# Patient Record
Sex: Female | Born: 1965 | Race: Black or African American | Hispanic: No | Marital: Married | Smoking: Never smoker
Health system: Southern US, Community
[De-identification: ages and names within clinical notes are randomized; demographics above are authoritative.]

## PROBLEM LIST (undated history)

## (undated) DIAGNOSIS — E119 Type 2 diabetes mellitus without complications: Secondary | ICD-10-CM

## (undated) DIAGNOSIS — Z9289 Personal history of other medical treatment: Secondary | ICD-10-CM

## (undated) DIAGNOSIS — I10 Essential (primary) hypertension: Secondary | ICD-10-CM

## (undated) DIAGNOSIS — D649 Anemia, unspecified: Secondary | ICD-10-CM

## (undated) DIAGNOSIS — IMO0001 Reserved for inherently not codable concepts without codable children: Secondary | ICD-10-CM

## (undated) HISTORY — PX: OOPHORECTOMY: SHX86

---

## 2001-06-11 HISTORY — PX: MYOMECTOMY: SHX85

## 2001-07-10 ENCOUNTER — Inpatient Hospital Stay (HOSPITAL_COMMUNITY): Admission: RE | Admit: 2001-07-10 | Discharge: 2001-07-11 | Payer: Self-pay | Admitting: Gynecology

## 2001-07-10 ENCOUNTER — Encounter (INDEPENDENT_AMBULATORY_CARE_PROVIDER_SITE_OTHER): Payer: Self-pay | Admitting: Specialist

## 2004-02-18 ENCOUNTER — Ambulatory Visit (HOSPITAL_COMMUNITY): Admission: RE | Admit: 2004-02-18 | Discharge: 2004-02-18 | Payer: Self-pay | Admitting: Gynecology

## 2004-03-13 HISTORY — PX: REDUCTION MAMMAPLASTY: SUR839

## 2004-04-06 ENCOUNTER — Other Ambulatory Visit: Admission: RE | Admit: 2004-04-06 | Discharge: 2004-04-06 | Payer: Self-pay | Admitting: Obstetrics and Gynecology

## 2004-07-18 ENCOUNTER — Ambulatory Visit (HOSPITAL_BASED_OUTPATIENT_CLINIC_OR_DEPARTMENT_OTHER): Admission: RE | Admit: 2004-07-18 | Discharge: 2004-07-18 | Payer: Self-pay | Admitting: Specialist

## 2004-07-18 ENCOUNTER — Encounter (INDEPENDENT_AMBULATORY_CARE_PROVIDER_SITE_OTHER): Payer: Self-pay | Admitting: *Deleted

## 2004-07-18 ENCOUNTER — Ambulatory Visit (HOSPITAL_COMMUNITY): Admission: RE | Admit: 2004-07-18 | Discharge: 2004-07-18 | Payer: Self-pay | Admitting: Specialist

## 2005-05-25 ENCOUNTER — Other Ambulatory Visit: Admission: RE | Admit: 2005-05-25 | Discharge: 2005-05-25 | Payer: Self-pay | Admitting: Gynecology

## 2005-05-26 ENCOUNTER — Ambulatory Visit (HOSPITAL_COMMUNITY): Admission: RE | Admit: 2005-05-26 | Discharge: 2005-05-26 | Payer: Self-pay | Admitting: Gynecology

## 2005-06-09 ENCOUNTER — Encounter: Admission: RE | Admit: 2005-06-09 | Discharge: 2005-06-09 | Payer: Self-pay | Admitting: Gynecology

## 2005-09-26 ENCOUNTER — Ambulatory Visit (HOSPITAL_COMMUNITY): Admission: RE | Admit: 2005-09-26 | Discharge: 2005-09-26 | Payer: Self-pay | Admitting: Obstetrics and Gynecology

## 2005-09-26 ENCOUNTER — Encounter (INDEPENDENT_AMBULATORY_CARE_PROVIDER_SITE_OTHER): Payer: Self-pay | Admitting: *Deleted

## 2006-03-23 ENCOUNTER — Ambulatory Visit (HOSPITAL_COMMUNITY): Admission: RE | Admit: 2006-03-23 | Discharge: 2006-03-23 | Payer: Self-pay | Admitting: Gynecology

## 2006-05-01 ENCOUNTER — Ambulatory Visit (HOSPITAL_COMMUNITY): Admission: RE | Admit: 2006-05-01 | Discharge: 2006-05-01 | Payer: Self-pay | Admitting: Gynecology

## 2006-09-13 ENCOUNTER — Other Ambulatory Visit: Admission: RE | Admit: 2006-09-13 | Discharge: 2006-09-13 | Payer: Self-pay | Admitting: Gynecology

## 2010-02-23 ENCOUNTER — Other Ambulatory Visit
Admission: RE | Admit: 2010-02-23 | Discharge: 2010-02-23 | Payer: Self-pay | Source: Home / Self Care | Admitting: Internal Medicine

## 2010-04-12 ENCOUNTER — Other Ambulatory Visit: Payer: Self-pay | Admitting: Internal Medicine

## 2010-04-12 DIAGNOSIS — Z1231 Encounter for screening mammogram for malignant neoplasm of breast: Secondary | ICD-10-CM

## 2010-04-12 DIAGNOSIS — Z1239 Encounter for other screening for malignant neoplasm of breast: Secondary | ICD-10-CM

## 2010-04-21 ENCOUNTER — Ambulatory Visit
Admission: RE | Admit: 2010-04-21 | Discharge: 2010-04-21 | Disposition: A | Discharge status: Home / Self Care | Payer: BC Managed Care – PPO | Source: Ambulatory Visit | Attending: Internal Medicine | Admitting: Internal Medicine

## 2010-04-21 DIAGNOSIS — Z1231 Encounter for screening mammogram for malignant neoplasm of breast: Secondary | ICD-10-CM

## 2010-07-29 NOTE — Op Note (Signed)
[facility]  Patient:    [name], [name] Visit Number: [id number] MRN: [id number]          Service Type: GYN Location: 4W 0449 01 Attending Physician:  Teodora Medici Cabitt Dictated by:   Leatha Gilding. Mezer, M.D. Proc. Date: 07/10/01 Admit Date:  07/10/2001   CC:         Harl Bowie, M.D.   Operative Report  PREOPERATIVE DIAGNOSIS:  Fibroid uterus.  POSTOPERATIVE DIAGNOSES: 1. Fibroid uterus. 2. Adhesions.  OPERATION PERFORMED:  Myomectomy.  SURGEON:  Leatha Gilding. Mezer, M.D.  ASSISTANT:  Harl Bowie, M.D.  ANESTHESIA:  General endotracheal.  PREPARATION:  Betadine.  DESCRIPTION OF PROCEDURE:  With the patient in the supine position, she was prepped and draped in the routine fashion.  A Pfannenstiel incision was made through the skin and subcutaneous tissue.  The fascia and peritoneum were then without difficulty.  A brief exploration of her upper abdomen was benign. Exploration of the pelvis revealed the uterus to be approximately 14 weeks in size, reasonably symmetrical, and rotated slightly with the left adnexa anterior.  There were dense adhesions of bowel to the entire posterior wall of the uterus, beginning just below the fundus.  The ovaries were adherent in these adhesions.  The fallopian tubes were remarkably normal given the degree of scarring.  There were no easy areas to either take the adhesions down or to gain access to the posterior aspect of the uterus.  As the patient was not having significant pain and the fallopian tubes were remarkably spared, the decision was made not to take these adhesions down.  Palpation of the uterus revealed a fibroid in the center part of the uterus in the AP direction to the left of the midline.  The remainder of the uterus was quite soft, and defining exactly where the fibroid was was not possible.  Given the significant potential for a hysterectomy and degree of scarring that had  been unanticipated, a consultation with the patients husband was held.  He felt that her wishes would be best served by proceeding with the myomectomy.  The surgeon returned to the operating room, re-scrubbed, and gowned.  A solution of dilute Pitressin was injected in the anterior uterine wall and cautery used to open the myometrium.  A fibroid was identified and with very slow and careful dissection, was extracted from the myometrium.  This fibroid measured 9 cm x 6 cm x 3 cm and was very multilobed.  Great care was taken not to enter the endometrial cavity.  Indigo carmine was injected transcervically and, indeed, the integrity of the cavity was verified.  The uterine incision was then closed in layers using running and interrupted 0 chromic suture, and the serosa was approximated with a running baseball stitch of 3-0 PDS.  Hemostasis was excellent.  At the completion of the procedure, the pelvis was irrigated with copious amounts of warm Lactated Ringers solution and aspirated.  With hemostasis intact, the abdomen was then closed in layers using a running 2-0 Vicryl on the peritoneum, running 0 Vicryl to the midline bilaterally on the fascia.  Hemostasis was assured in the subcutaneous tissue, and the skin was closed with staples.  The estimated blood loss was approximately 100 cc.  The sponge, needle, and instrument counts were correct x 2.  The patient tolerated the procedure well and was taken to the recovery room in satisfactory condition. Dictated by:   Leatha Gilding. Mezer, M.D. Attending Physician:  Teodora Medici  Cabitt DD:  07/10/01 TD:  07/11/01 Job: [id number]

## 2010-07-29 NOTE — Op Note (Signed)
NAMECORSICA, FRANSON            ACCOUNT NO.:  [id number]   MEDICAL RECORD NO.:  [id number]          PATIENT TYPE:  AMB   LOCATION:  DSC                          FACILITY:  [facility]   PHYSICIAN:  Gerald L. Truesdale, M.D.DATE OF BIRTH:  [birth year]   DATE OF PROCEDURE:  07/18/2004  DATE OF DISCHARGE:                                 OPERATIVE REPORT   HISTORY OF PRESENT ILLNESS:  This is a 45 year old lady with darkened  pigmented lesion involving her left chest area measuring approximately 1 x  1.2 cm, darker, and has gotten larger over the last few months.   PROCEDURES DONE:  Excision and plastic reconstruction.   ANESTHESIA:  General in preparation for a breast reduction.   Prep was done with Betadine soap and solution, walled off with sterile  towels, and draped so as to make a sterile field. After the patient was  intubated, wide excision was made of the mass. Subcutaneous closure with  done with superior flaps and inferior flap freeing up and the wounds were  brought back together in layers with 3-0 Monocryl x2 layers, a subdermal  suture of 5-0 Monocryl, and then an area in the subcuticular stitch of 5-0  Monocryl. With adequate margins the specimen was sent to pathology for  examination. Steri-Strips and soft dressings were applied. She withstood the  procedures very well and was taken to recovery in excellent condition.      GLT/MEDQ  D:  07/18/2004  T:  07/18/2004  Job:  [id number]

## 2010-07-29 NOTE — H&P (Signed)
[facility]  Patient:    [name], [name] Visit Number: [id number] MRN: [id number]          Service Type: GYN Location: 4W 0449 01 Attending Physician:  Teodora Medici Cabitt Dictated by:   Leatha Gilding. Mezer, M.D. Admit Date:  07/10/2001                           History and Physical  ADMISSION DIAGNOSIS:  Fibroid uterus.  HISTORY OF PRESENT ILLNESS:  The patient is a 45 year old, gravida 1, SAB 1, female admitted with fibroid uterus for myomectomy.  The patient has been without contraception for the last three years and has regular menses occurring every 28-32 days.  Her last menstrual period was on June 30, 2001, and was reported to be normal.  On hysterosalpingogram, there was marked distortion of the uterine cavity with presumably a large fibroid on the left side of the cavity.  There was left proximal fill with right fill and spill. A myomectomy has been discussed at length with the patient and her husband and they have consented to proceed with myomectomy.  The potential risks of the surgery, including, but not limited to anesthesia, injury to the bowel, bladder, and ureters, possible blood loss with transfusion and its sequelae, and possible infection, have been discussed.  The potential for postoperative adhesions and decreased fertility has also been reviewed.  The status of the left fallopian tube will need to be determined at the time of surgery and it may suffer damage as a result of this surgery.  The patient and her husband understand that there is no guarantee to become pregnant or not to miscarry after the surgery.  The potential for a hysterectomy given the size and location of the fibroid has also been discussed at length.  The patient understands that if she undergoes hysterectomy that this will result in permanent sterilization.  Postoperative restrictions and expectations have been discussed.  The patient understands that given the  signs and location of the fibroid it is imperative that she not conceive for at least six months postoperatively because of the potential for rupture of the uterus that could be catastrophic for both the fetus and the mother.  The patient and her husband appear to understand the surgery and its limitations.  PAST SURGICAL HISTORY: 1. Thumb. 2. Hysteroscopy.  PAST MEDICAL HISTORY:  Noncontributory.  MEDICATIONS:  Iron.  ALLERGIES:  None known.  SOCIAL HISTORY:  ETOH:  Occasional.  The patient is married to a nephrologist in [place], [place], and is employed as an Airline pilot.  FAMILY HISTORY:  Positive for diabetes and fibroids.  PHYSICAL EXAMINATION:  HEENT negative.  LUNGS:  Clear.  HEART:  Without murmurs.  BREASTS:  Without masses or discharge.  ABDOMEN:  Soft and nontender.  PELVIC:  BUS, vagina, and cervix normal.  The uterus is approximately 14-16 weeks size and irregular.  The adnexa are without palpable masses.  EXTREMITIES:  Negative.  IMPRESSION:  Fibroid uterus.  PLAN:  Myomectomy. Dictated by:   Leatha Gilding. Mezer, M.D. Attending Physician:  Rolinda Roan DD:  07/10/01 TD:  07/10/01 Job: [id number]

## 2010-07-29 NOTE — H&P (Signed)
NAME:  [name], [name] NO.:  [id number]   MEDICAL RECORD NO.:  [id number]          PATIENT TYPE:  AMB   LOCATION:  SDC                           FACILITY:  [facility]   PHYSICIAN:  Zenaida Niece, M.D.DATE OF BIRTH:  [birth year]   DATE OF ADMISSION:  DATE OF DISCHARGE:                                HISTORY & PHYSICAL   CHIEF COMPLAINT:  Pelvic pain and large ovarian cyst.   HISTORY OF PRESENT ILLNESS:  This is a 45 year old black female gravida 1,  para 0-0-1-0, who was referred to me by Dr. Teodora Medici.  She has had  regular menses with her period in May slightly late.  She has had a pelvic  ultrasound in March which revealed an 8.5 cm septated cyst in the left  adnexa and fibroids.  MRI confirmed a large lesion in the left adnexa and  fibroids and L5 to S1 disc protrusions.  The patient wishes to preserve  fertility and due to the persistent presence of this cyst, wants to undergo  surgical evaluation and possible removal of this cyst.  Again she does not  want anything done about the fibroids because she wants to maintain the  possibility of fertility.   ALLERGIES:  NO KNOWN DRUG ALLERGIES.   CURRENT MEDICATIONS:  Cozaar and vitamins.   PAST OBSTETRIC HISTORY:  One spontaneous abortion.   PAST MEDICAL HISTORY:  Hypertension.   PAST SURGICAL HISTORY:  1.  Laparotomy with myomectomy.  2.  Breast reduction.  3.  Laparoscopy.   PAST GYNECOLOGIC HISTORY:  No abnormal Pap smears or sexually transmitted  diseases.   FAMILY HISTORY:  No gynecologic or colon cancer.   SOCIAL HISTORY:  She is married and denies alcohol, tobacco or drug use.   REVIEW OF SYSTEMS:  Normal bowel and bladder function.   PHYSICAL EXAMINATION:  GENERAL APPEARANCE:  This is a well-developed black  female in no acute distress.  VITAL SIGNS:  Weight is approximately 191 pounds, blood pressure 158/102,  pulses 96.  NECK:  Supple without lymphadenopathy or thyromegaly.  LUNGS:  Clear  to auscultation.  CARDIOVASCULAR:  Regular rate and rhythm without murmur.  ABDOMEN:  Soft, nontender and nondistended with uterine fundus palpable just  above the pubic symphysis.  EXTREMITIES:  No edema and are nontender.  PELVIC:  External genitalia has no lesions.  On bimanual exam, she has an  approximately 14-week size irregular uterus with a fullness on the left side  which is slightly tender.   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  She has had prior myomectomy.  She does wish to  preserve fertility.  All nonsurgical and surgical options have been  discussed with the patient.  She wishes to proceed with laparoscopy with  evaluation of and possible removal of this ovarian mass.  She does  understand there is a chance for laparotomy if I am unable to remove this  through the scope.  All risks of surgery have been discussed with the  patient.   PLAN:  Admit the patient on the day  of surgery for an open laparoscopy with  possible left salpingo-oophorectomy or left ovarian cystectomy and possible  laparotomy.      Zenaida Niece, M.D.  Electronically Signed     TDM/MEDQ  D:  09/25/2005  T:  09/25/2005  Job:  [id number]

## 2010-07-29 NOTE — Op Note (Signed)
NAMEMORGEN, LINEBAUGH            ACCOUNT NO.:  [id number]   MEDICAL RECORD NO.:  [id number]          PATIENT TYPE:  AMB   LOCATION:  DSC                          FACILITY:  [facility]   PHYSICIAN:  Gerald L. Truesdale, M.D.DATE OF BIRTH:  1965/08/13   DATE OF PROCEDURE:  07/18/2004  DATE OF DISCHARGE:                                 OPERATIVE REPORT   REASON FOR HOSPITALIZATION:  A 45 year old lady who was coming to surgery  today on 07/18/2004 with severe macromastia, back and shoulder pain secondary  to large pendulous breasts refractory to conservative treatment.   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   PROCEDURES:  Bilateral breast reductions using the inferior pedicle  technique.   ANESTHESIA:  General.   DESCRIPTION OF PROCEDURE:  Preoperatively, the patient was set up and drawn  for the reduction mammoplasty, inferior pedicle technique.  We marked in the  nipple areolar complexes back up to 20 cm from the suprasternal notch.  She  then underwent general anesthesia and intubated orally.  Prep was down to  the chest and breast areas in routine fashion using Betadine soap and  solution, walled off with sterile towels and drapes so as to make a sterile  field.  Next 1.4% Xylocaine with epinephrine, 1:400,000 concentration was  injected locally and a total of 150 mL per side.  The wounds were scored  with a #15 blade and skin over the inferior pedicle was deepithelized with  #20 blades.  Medial and lateral fatty dermal pedicles were excised down to  the underlying fascia.  Laterally, more tissue was taken to improve  symmetry.  The new key hole area was also debulked.  After proper  hemostasis, the flaps were transposed and stayed with 3-0 Prolene.  Subcutaneous closures done with 3-0 Monocryl x2 layers, and then running a  subcuticular stitch with  3-0 Vicryl and 5-0 Vicryl throughout the inverted  T.  The wounds were drained with #10 fully fluted Blake drains which were  placed in the depths of the wound through the lateralist portion, and then  incision secured with 3-0 Prolene.  After excellent symmetry and good refill  of the nipple areolar complexes, Steri-Strips and soft dressings were  applied to all the areas including Xeroform, 4x4 with ABDs and Hypafix tape.  She was then taken to recovery in excellent condition.   CONDITION ON DISCHARGE:  Excellent.   ESTIMATED BLOOD LOSS:  Less than 100 mL.   COMPLICATIONS:  None.     GLT/MEDQ  D:  07/18/2004  T:  07/18/2004  Job:  [id number]

## 2010-07-29 NOTE — Op Note (Signed)
[name], [name]            ACCOUNT NO.:  [id number]   MEDICAL RECORD NO.:  [id number]          PATIENT TYPE:  AMB   LOCATION:  SDC                           FACILITY:  [facility]   PHYSICIAN:  Zenaida Niece, M.D.DATE OF BIRTH:  14-Aug-1965   DATE OF PROCEDURE:  09/26/2005  DATE OF DISCHARGE:                                 OPERATIVE REPORT   PREOPERATIVE DIAGNOSIS:  Left pelvic mass and uterine myomas.   POSTOPERATIVE DIAGNOSIS:  Left pelvic mass, uterine myomas and pelvic  adhesions.   PROCEDURE:  Open laparoscopy with significant adhesiolysis and probable left  salpingo-oophorectomy.   SURGEON:  Zenaida Niece, M.D.   ASSISTANT:  Malachi Pro. Ambrose Mantle, M.D.   ANESTHESIA:  General endotracheal tube.   SPECIMENS:  Probable left tube and ovary sent to pathology.   ESTIMATED BLOOD LOSS:  100 mL.   COMPLICATIONS:  None.   FINDINGS:  She had extensive pelvic adhesions from her prior myomectomy.  The omentum was adherent to the posterior uterus.  There was a large cystic  structure in the left adnexa that appeared to be collapsing, and I was  unable to definitively identify the tube and ovary.  I did feel that I  eventually did see left fimbria.  I was able to see the right distal  fimbriae but was unable to see the remainder of the tube and ovary.  The  liver is adherent to the anterior abdominal wall essentially across the  entire upper abdomen.  There were omental adhesions to the anterior  abdominal wall just to the right of the umbilicus.   PROCEDURE IN DETAIL:  The patient was taken to the operating room and placed  in the dorsal supine position.  General anesthesia was induced, and she was  placed in mobile stirrups.  Abdomen was then prepped and draped in the usual  sterile fashion, Hulka tenaculum applied to the cervix for uterine  manipulation, Foley catheter was inserted.  Infraumbilical skin was then  infiltrated with 0.25% Marcaine, and a 3-cm horizontal  incision was made.  This was carried down to the fascia which was elevated and entered sharply.  Peritoneum was also entered sharply.  Adhesions of omentum to the internal  abdominal wall were swept away, but I was unable to take them all down.  A  pursestring suture of 0 Vicryl was placed around the fascia, and the Hassan  cannula was inserted.  CO2 gas was insufflated, and the laparoscope was  inserted with good visualization.  Five-mm ports were placed on the left and  right side under direct visualization.  With difficulty, I was able to take  down adhesions of omentum and bowel to the posterior uterus using the  harmonic scalpel Ace.  Again, this was difficult due to fairly thick  adhesions.  I was never able to definitively identify the proximal right  tube and ovary.  I was able to see tubal fimbria sitting on the right side.  On the left side, there was a large cystic structure which I am not sure  where it was coming from.  It may have  been a collapsed peritubal cyst.  I  was able to eventually see fimbriae, but the tube appeared distorted, and  the anatomy was significantly distorted.  Using countertraction, I was able  to free the cystic material with the tube and I think the ovary from  surrounding tissue using the harmonic scalpel Ace.  Bleeding was controlled  with bipolar cautery.  I was with not deep in the pelvis, so I do not feel I  was anywhere near the ureter.  I was able to finally separate this mass and  placed in the posterior cul-de-sac.  I was not able to see the entire  posterior cul-de-sac as the uterus was large and bulky with fibroids with  adhesions, so the posterior cul-de-sac was not definitively identified  either.  The laparoscope was removed, and a 5-mm scope was placed on the  right side.  An Endobag was put through the umbilical trocar, and the mass  was scooped up into this and removed through the umbilical incision.  The  trocar was then reinserted.   Irrigation revealed all the sites to have  hemostasis.  The 5-mm ports were then removed under direct visualization.  The laparoscope and umbilical trocar were removed and gas allowed to deflate  from the abdomen.  The pursestring suture was tied.  A small gap in the  middle was fixed with a figure-of-eight suture of 0Vicryl.  The skin  incisions were closed with interrupted subcuticular sutures of 4-0 Vicryl  followed by Dermabond.  The Hulka tenaculum was removed.  The Foley catheter  was also removed.  The patient tolerated the procedure well.  She was  extubated in the operating room and taken to the recovery room in stable  condition.  Counts were correct, and she had PAS hose on throughout  procedure.      Zenaida Niece, M.D.  Electronically Signed     TDM/MEDQ  D:  09/26/2005  T:  09/26/2005  Job:  [id number]

## 2011-04-04 ENCOUNTER — Other Ambulatory Visit (HOSPITAL_COMMUNITY)
Admission: RE | Admit: 2011-04-04 | Discharge: 2011-04-04 | Disposition: A | Discharge status: Home / Self Care | Payer: BC Managed Care – PPO | Source: Ambulatory Visit | Attending: Internal Medicine | Admitting: Internal Medicine

## 2011-04-04 ENCOUNTER — Other Ambulatory Visit: Payer: Self-pay | Admitting: Internal Medicine

## 2011-04-04 DIAGNOSIS — Z01419 Encounter for gynecological examination (general) (routine) without abnormal findings: Secondary | ICD-10-CM | POA: Insufficient documentation

## 2012-03-27 ENCOUNTER — Other Ambulatory Visit: Payer: Self-pay | Admitting: Gynecology

## 2013-04-15 ENCOUNTER — Other Ambulatory Visit: Payer: Self-pay | Admitting: Internal Medicine

## 2013-04-15 ENCOUNTER — Other Ambulatory Visit (HOSPITAL_COMMUNITY)
Admission: RE | Admit: 2013-04-15 | Discharge: 2013-04-15 | Disposition: A | Discharge status: Home / Self Care | Payer: No Typology Code available for payment source | Source: Ambulatory Visit | Attending: Internal Medicine | Admitting: Internal Medicine

## 2013-04-15 DIAGNOSIS — Z01419 Encounter for gynecological examination (general) (routine) without abnormal findings: Secondary | ICD-10-CM | POA: Insufficient documentation

## 2013-04-15 DIAGNOSIS — Z1151 Encounter for screening for human papillomavirus (HPV): Secondary | ICD-10-CM | POA: Insufficient documentation

## 2015-03-14 DIAGNOSIS — D649 Anemia, unspecified: Secondary | ICD-10-CM

## 2015-03-14 DIAGNOSIS — Z9289 Personal history of other medical treatment: Secondary | ICD-10-CM

## 2015-03-14 HISTORY — DX: Personal history of other medical treatment: Z92.89

## 2015-03-14 HISTORY — DX: Anemia, unspecified: D64.9

## 2015-03-27 ENCOUNTER — Inpatient Hospital Stay (HOSPITAL_COMMUNITY): Payer: Managed Care, Other (non HMO)

## 2015-03-27 ENCOUNTER — Inpatient Hospital Stay (HOSPITAL_COMMUNITY)
Admission: EM | Admit: 2015-03-27 | Discharge: 2015-04-07 | DRG: 853 | Disposition: A | Discharge status: Home Health | Payer: Managed Care, Other (non HMO) | Attending: Internal Medicine | Admitting: Internal Medicine

## 2015-03-27 ENCOUNTER — Encounter (HOSPITAL_COMMUNITY): Payer: Self-pay | Admitting: *Deleted

## 2015-03-27 DIAGNOSIS — N179 Acute kidney failure, unspecified: Secondary | ICD-10-CM | POA: Diagnosis not present

## 2015-03-27 DIAGNOSIS — N19 Unspecified kidney failure: Secondary | ICD-10-CM | POA: Insufficient documentation

## 2015-03-27 DIAGNOSIS — Z9889 Other specified postprocedural states: Secondary | ICD-10-CM | POA: Diagnosis not present

## 2015-03-27 DIAGNOSIS — E119 Type 2 diabetes mellitus without complications: Secondary | ICD-10-CM | POA: Diagnosis not present

## 2015-03-27 DIAGNOSIS — R109 Unspecified abdominal pain: Secondary | ICD-10-CM

## 2015-03-27 DIAGNOSIS — M542 Cervicalgia: Secondary | ICD-10-CM | POA: Diagnosis not present

## 2015-03-27 DIAGNOSIS — R590 Localized enlarged lymph nodes: Secondary | ICD-10-CM | POA: Diagnosis present

## 2015-03-27 DIAGNOSIS — R000 Tachycardia, unspecified: Secondary | ICD-10-CM | POA: Diagnosis present

## 2015-03-27 DIAGNOSIS — E860 Dehydration: Secondary | ICD-10-CM | POA: Diagnosis present

## 2015-03-27 DIAGNOSIS — I472 Ventricular tachycardia: Secondary | ICD-10-CM | POA: Diagnosis not present

## 2015-03-27 DIAGNOSIS — M1990 Unspecified osteoarthritis, unspecified site: Secondary | ICD-10-CM | POA: Diagnosis not present

## 2015-03-27 DIAGNOSIS — M62838 Other muscle spasm: Secondary | ICD-10-CM | POA: Diagnosis present

## 2015-03-27 DIAGNOSIS — I4729 Other ventricular tachycardia: Secondary | ICD-10-CM | POA: Diagnosis present

## 2015-03-27 DIAGNOSIS — M25511 Pain in right shoulder: Secondary | ICD-10-CM

## 2015-03-27 DIAGNOSIS — B952 Enterococcus as the cause of diseases classified elsewhere: Secondary | ICD-10-CM | POA: Diagnosis present

## 2015-03-27 DIAGNOSIS — Y95 Nosocomial condition: Secondary | ICD-10-CM | POA: Diagnosis present

## 2015-03-27 DIAGNOSIS — J189 Pneumonia, unspecified organism: Secondary | ICD-10-CM | POA: Diagnosis not present

## 2015-03-27 DIAGNOSIS — R0682 Tachypnea, not elsewhere classified: Secondary | ICD-10-CM

## 2015-03-27 DIAGNOSIS — E871 Hypo-osmolality and hyponatremia: Secondary | ICD-10-CM | POA: Diagnosis present

## 2015-03-27 DIAGNOSIS — Z79899 Other long term (current) drug therapy: Secondary | ICD-10-CM | POA: Diagnosis not present

## 2015-03-27 DIAGNOSIS — E1165 Type 2 diabetes mellitus with hyperglycemia: Secondary | ICD-10-CM | POA: Diagnosis present

## 2015-03-27 DIAGNOSIS — M7989 Other specified soft tissue disorders: Secondary | ICD-10-CM | POA: Diagnosis not present

## 2015-03-27 DIAGNOSIS — B9689 Other specified bacterial agents as the cause of diseases classified elsewhere: Secondary | ICD-10-CM | POA: Diagnosis not present

## 2015-03-27 DIAGNOSIS — B954 Other streptococcus as the cause of diseases classified elsewhere: Secondary | ICD-10-CM | POA: Diagnosis not present

## 2015-03-27 DIAGNOSIS — N170 Acute kidney failure with tubular necrosis: Secondary | ICD-10-CM | POA: Diagnosis present

## 2015-03-27 DIAGNOSIS — D509 Iron deficiency anemia, unspecified: Secondary | ICD-10-CM | POA: Diagnosis present

## 2015-03-27 DIAGNOSIS — R410 Disorientation, unspecified: Secondary | ICD-10-CM | POA: Diagnosis not present

## 2015-03-27 DIAGNOSIS — A419 Sepsis, unspecified organism: Principal | ICD-10-CM

## 2015-03-27 DIAGNOSIS — R21 Rash and other nonspecific skin eruption: Secondary | ICD-10-CM | POA: Diagnosis present

## 2015-03-27 DIAGNOSIS — R291 Meningismus: Secondary | ICD-10-CM | POA: Diagnosis present

## 2015-03-27 DIAGNOSIS — M130 Polyarthritis, unspecified: Secondary | ICD-10-CM | POA: Diagnosis present

## 2015-03-27 DIAGNOSIS — E0810 Diabetes mellitus due to underlying condition with ketoacidosis without coma: Secondary | ICD-10-CM

## 2015-03-27 DIAGNOSIS — R1110 Vomiting, unspecified: Secondary | ICD-10-CM | POA: Diagnosis not present

## 2015-03-27 DIAGNOSIS — M79601 Pain in right arm: Secondary | ICD-10-CM

## 2015-03-27 DIAGNOSIS — R7881 Bacteremia: Secondary | ICD-10-CM

## 2015-03-27 DIAGNOSIS — I10 Essential (primary) hypertension: Secondary | ICD-10-CM | POA: Diagnosis not present

## 2015-03-27 DIAGNOSIS — M2550 Pain in unspecified joint: Secondary | ICD-10-CM | POA: Diagnosis present

## 2015-03-27 DIAGNOSIS — D638 Anemia in other chronic diseases classified elsewhere: Secondary | ICD-10-CM | POA: Diagnosis present

## 2015-03-27 DIAGNOSIS — A400 Sepsis due to streptococcus, group A: Secondary | ICD-10-CM

## 2015-03-27 DIAGNOSIS — Z794 Long term (current) use of insulin: Secondary | ICD-10-CM

## 2015-03-27 DIAGNOSIS — Z3201 Encounter for pregnancy test, result positive: Secondary | ICD-10-CM

## 2015-03-27 DIAGNOSIS — D72829 Elevated white blood cell count, unspecified: Secondary | ICD-10-CM | POA: Diagnosis present

## 2015-03-27 DIAGNOSIS — M25512 Pain in left shoulder: Secondary | ICD-10-CM | POA: Diagnosis not present

## 2015-03-27 DIAGNOSIS — J9601 Acute respiratory failure with hypoxia: Secondary | ICD-10-CM | POA: Diagnosis not present

## 2015-03-27 DIAGNOSIS — M25421 Effusion, right elbow: Secondary | ICD-10-CM | POA: Diagnosis not present

## 2015-03-27 DIAGNOSIS — R740 Nonspecific elevation of levels of transaminase and lactic acid dehydrogenase [LDH]: Secondary | ICD-10-CM | POA: Diagnosis not present

## 2015-03-27 DIAGNOSIS — R1111 Vomiting without nausea: Secondary | ICD-10-CM

## 2015-03-27 DIAGNOSIS — L040 Acute lymphadenitis of face, head and neck: Secondary | ICD-10-CM | POA: Diagnosis not present

## 2015-03-27 DIAGNOSIS — I959 Hypotension, unspecified: Secondary | ICD-10-CM | POA: Diagnosis present

## 2015-03-27 DIAGNOSIS — I889 Nonspecific lymphadenitis, unspecified: Secondary | ICD-10-CM | POA: Diagnosis present

## 2015-03-27 DIAGNOSIS — J90 Pleural effusion, not elsewhere classified: Secondary | ICD-10-CM | POA: Diagnosis not present

## 2015-03-27 DIAGNOSIS — E8809 Other disorders of plasma-protein metabolism, not elsewhere classified: Secondary | ICD-10-CM | POA: Diagnosis not present

## 2015-03-27 DIAGNOSIS — N289 Disorder of kidney and ureter, unspecified: Secondary | ICD-10-CM | POA: Diagnosis not present

## 2015-03-27 DIAGNOSIS — M009 Pyogenic arthritis, unspecified: Secondary | ICD-10-CM | POA: Diagnosis present

## 2015-03-27 DIAGNOSIS — B955 Unspecified streptococcus as the cause of diseases classified elsewhere: Secondary | ICD-10-CM | POA: Diagnosis present

## 2015-03-27 DIAGNOSIS — Z7984 Long term (current) use of oral hypoglycemic drugs: Secondary | ICD-10-CM | POA: Diagnosis not present

## 2015-03-27 DIAGNOSIS — R509 Fever, unspecified: Secondary | ICD-10-CM | POA: Diagnosis not present

## 2015-03-27 DIAGNOSIS — R0902 Hypoxemia: Secondary | ICD-10-CM

## 2015-03-27 DIAGNOSIS — B950 Streptococcus, group A, as the cause of diseases classified elsewhere: Secondary | ICD-10-CM | POA: Diagnosis not present

## 2015-03-27 DIAGNOSIS — R197 Diarrhea, unspecified: Secondary | ICD-10-CM

## 2015-03-27 DIAGNOSIS — M436 Torticollis: Secondary | ICD-10-CM

## 2015-03-27 DIAGNOSIS — M00211 Other streptococcal arthritis, right shoulder: Secondary | ICD-10-CM | POA: Diagnosis not present

## 2015-03-27 HISTORY — DX: Essential (primary) hypertension: I10

## 2015-03-27 LAB — COMPREHENSIVE METABOLIC PANEL
ALK PHOS: 115 U/L (ref 38–126)
ALT: 31 U/L (ref 14–54)
ANION GAP: 20 — AB (ref 5–15)
AST: 41 U/L (ref 15–41)
Albumin: 3.2 g/dL — ABNORMAL LOW (ref 3.5–5.0)
BILIRUBIN TOTAL: 2.1 mg/dL — AB (ref 0.3–1.2)
BUN: 60 mg/dL — ABNORMAL HIGH (ref 6–20)
CO2: 22 mmol/L (ref 22–32)
Calcium: 8.3 mg/dL — ABNORMAL LOW (ref 8.9–10.3)
Chloride: 91 mmol/L — ABNORMAL LOW (ref 101–111)
Creatinine, Ser: 5.25 mg/dL — ABNORMAL HIGH (ref 0.44–1.00)
GFR calc non Af Amer: 9 mL/min — ABNORMAL LOW (ref >60)
GFR, EST AFRICAN AMERICAN: 10 mL/min — AB (ref >60)
Glucose, Bld: 188 mg/dL — ABNORMAL HIGH (ref 65–99)
Potassium: 3.9 mmol/L (ref 3.5–5.1)
SODIUM: 133 mmol/L — AB (ref 135–145)
TOTAL PROTEIN: 7.2 g/dL (ref 6.5–8.1)

## 2015-03-27 LAB — DIFFERENTIAL
BASOS PCT: 0 %
Basophils Absolute: 0 10*3/uL (ref 0.0–0.1)
Eosinophils Absolute: 0 10*3/uL (ref 0.0–0.7)
Eosinophils Relative: 0 %
Lymphocytes Relative: 2 %
Lymphs Abs: 0.4 10*3/uL — ABNORMAL LOW (ref 0.7–4.0)
MONOS PCT: 1 %
Monocytes Absolute: 0.3 10*3/uL (ref 0.1–1.0)
Neutro Abs: 21.1 10*3/uL — ABNORMAL HIGH (ref 1.7–7.7)
Neutrophils Relative %: 97 %

## 2015-03-27 LAB — URINALYSIS, ROUTINE W REFLEX MICROSCOPIC
Glucose, UA: NEGATIVE mg/dL
Ketones, ur: 15 mg/dL — AB
NITRITE: NEGATIVE
Protein, ur: 30 mg/dL — AB
Specific Gravity, Urine: 1.023 (ref 1.005–1.030)
pH: 5 (ref 5.0–8.0)

## 2015-03-27 LAB — CBC
HCT: 33.6 % — ABNORMAL LOW (ref 36.0–46.0)
HEMOGLOBIN: 11.4 g/dL — AB (ref 12.0–15.0)
MCH: 27.8 pg (ref 26.0–34.0)
MCHC: 33.9 g/dL (ref 30.0–36.0)
MCV: 82 fL (ref 78.0–100.0)
PLATELETS: 176 10*3/uL (ref 150–400)
RBC: 4.1 MIL/uL (ref 3.87–5.11)
RDW: 13.3 % (ref 11.5–15.5)
WBC: 21.9 10*3/uL — ABNORMAL HIGH (ref 4.0–10.5)

## 2015-03-27 LAB — BASIC METABOLIC PANEL
ANION GAP: 13 (ref 5–15)
BUN: 53 mg/dL — AB (ref 6–20)
CO2: 20 mmol/L — AB (ref 22–32)
Calcium: 7.1 mg/dL — ABNORMAL LOW (ref 8.9–10.3)
Chloride: 102 mmol/L (ref 101–111)
Creatinine, Ser: 3.45 mg/dL — ABNORMAL HIGH (ref 0.44–1.00)
GFR calc Af Amer: 17 mL/min — ABNORMAL LOW (ref >60)
GFR, EST NON AFRICAN AMERICAN: 14 mL/min — AB (ref >60)
GLUCOSE: 143 mg/dL — AB (ref 65–99)
POTASSIUM: 4 mmol/L (ref 3.5–5.1)
Sodium: 135 mmol/L (ref 135–145)

## 2015-03-27 LAB — CREATININE, URINE, RANDOM: Creatinine, Urine: 76.57 mg/dL

## 2015-03-27 LAB — LACTIC ACID, PLASMA
LACTIC ACID, VENOUS: 2 mmol/L (ref 0.5–2.0)
Lactic Acid, Venous: 2.8 mmol/L (ref 0.5–2.0)
Lactic Acid, Venous: 1.5 mmol/L (ref 0.5–2.0)

## 2015-03-27 LAB — GLUCOSE, CAPILLARY: Glucose-Capillary: 104 mg/dL — ABNORMAL HIGH (ref 65–99)

## 2015-03-27 LAB — PROCALCITONIN: Procalcitonin: 98.22 ng/mL

## 2015-03-27 LAB — URINE MICROSCOPIC-ADD ON

## 2015-03-27 LAB — I-STAT CG4 LACTIC ACID, ED
LACTIC ACID, VENOUS: 1.45 mmol/L (ref 0.5–2.0)
Lactic Acid, Venous: 2.81 mmol/L (ref 0.5–2.0)

## 2015-03-27 LAB — APTT: APTT: 47 s — AB (ref 24–37)

## 2015-03-27 LAB — I-STAT BETA HCG BLOOD, ED (MC, WL, AP ONLY): HCG, QUANTITATIVE: 48.2 m[IU]/mL — AB (ref <5)

## 2015-03-27 LAB — LIPASE, BLOOD: Lipase: 22 U/L (ref 11–51)

## 2015-03-27 LAB — HCG, QUANTITATIVE, PREGNANCY: HCG, BETA CHAIN, QUANT, S: 26 m[IU]/mL — AB (ref <5)

## 2015-03-27 LAB — PREGNANCY, URINE: PREG TEST UR: NEGATIVE

## 2015-03-27 LAB — PROTIME-INR
INR: 1.41 (ref 0.00–1.49)
PROTHROMBIN TIME: 17.3 s — AB (ref 11.6–15.2)

## 2015-03-27 LAB — SODIUM, URINE, RANDOM: Sodium, Ur: 16 mmol/L

## 2015-03-27 MED ORDER — BARIUM SULFATE 2.1 % PO SUSP
ORAL | Status: AC
  Filled 2015-03-27: qty 2

## 2015-03-27 MED ORDER — SODIUM CHLORIDE 0.9 % IV BOLUS (SEPSIS)
1000.0000 mL | Freq: Once | INTRAVENOUS | Status: AC
  Administered 2015-03-27: 1000 mL via INTRAVENOUS

## 2015-03-27 MED ORDER — ONDANSETRON HCL 4 MG/2ML IJ SOLN
4.0000 mg | Freq: Once | INTRAMUSCULAR | Status: AC
  Administered 2015-03-27: 4 mg via INTRAVENOUS
  Filled 2015-03-27: qty 2

## 2015-03-27 MED ORDER — BARIUM SULFATE 2.1 % PO SUSP
ORAL | Status: AC
  Administered 2015-03-27: 450 mL
  Filled 2015-03-27: qty 2

## 2015-03-27 MED ORDER — SODIUM CHLORIDE 0.9 % IV BOLUS (SEPSIS)
2000.0000 mL | Freq: Once | INTRAVENOUS | Status: AC
  Administered 2015-03-27: 2000 mL via INTRAVENOUS

## 2015-03-27 MED ORDER — HYDROMORPHONE HCL 1 MG/ML IJ SOLN
1.0000 mg | Freq: Once | INTRAMUSCULAR | Status: AC
  Administered 2015-03-27: 1 mg via INTRAVENOUS
  Filled 2015-03-27: qty 1

## 2015-03-27 MED ORDER — SODIUM CHLORIDE 0.9 % IV SOLN: INTRAVENOUS | Status: DC

## 2015-03-27 MED ORDER — INSULIN ASPART 100 UNIT/ML ~~LOC~~ SOLN
3.0000 [IU] | Freq: Three times a day (TID) | SUBCUTANEOUS | Status: DC
  Administered 2015-03-28 – 2015-04-07 (×21): 3 [IU] via SUBCUTANEOUS

## 2015-03-27 MED ORDER — HYDROMORPHONE HCL 1 MG/ML IJ SOLN
0.5000 mg | INTRAMUSCULAR | Status: DC | PRN
  Administered 2015-03-27 – 2015-03-28 (×3): 0.5 mg via INTRAVENOUS
  Filled 2015-03-27 (×3): qty 1

## 2015-03-27 MED ORDER — HEPARIN SODIUM (PORCINE) 5000 UNIT/ML IJ SOLN
5000.0000 [IU] | Freq: Three times a day (TID) | INTRAMUSCULAR | Status: DC
  Administered 2015-03-27 – 2015-03-29 (×5): 5000 [IU] via SUBCUTANEOUS
  Filled 2015-03-27 (×4): qty 1

## 2015-03-27 MED ORDER — PIPERACILLIN-TAZOBACTAM 3.375 G IVPB
3.3750 g | Freq: Once | INTRAVENOUS | Status: AC
  Administered 2015-03-27: 3.375 g via INTRAVENOUS
  Filled 2015-03-27: qty 50

## 2015-03-27 MED ORDER — VANCOMYCIN HCL IN DEXTROSE 1-5 GM/200ML-% IV SOLN
1000.0000 mg | Freq: Once | INTRAVENOUS | Status: DC
  Filled 2015-03-27: qty 200

## 2015-03-27 MED ORDER — SODIUM CHLORIDE 0.9 % IV SOLN
6.0000 mg/kg | Freq: Once | INTRAVENOUS | Status: AC
  Administered 2015-03-27: 473.5 mg via INTRAVENOUS
  Filled 2015-03-27: qty 9.47

## 2015-03-27 MED ORDER — SODIUM CHLORIDE 0.9 % IV SOLN
INTRAVENOUS | Status: DC
  Administered 2015-03-27 – 2015-03-28 (×3): via INTRAVENOUS
  Administered 2015-03-28: 500 mL via INTRAVENOUS
  Administered 2015-03-29: 08:00:00 via INTRAVENOUS

## 2015-03-27 MED ORDER — INSULIN ASPART 100 UNIT/ML ~~LOC~~ SOLN
0.0000 [IU] | Freq: Three times a day (TID) | SUBCUTANEOUS | Status: DC
  Administered 2015-03-28 – 2015-03-29 (×2): 1 [IU] via SUBCUTANEOUS
  Administered 2015-03-29 – 2015-03-30 (×2): 2 [IU] via SUBCUTANEOUS
  Administered 2015-03-30: 3 [IU] via SUBCUTANEOUS
  Administered 2015-03-31: 2 [IU] via SUBCUTANEOUS
  Administered 2015-03-31: 3 [IU] via SUBCUTANEOUS
  Administered 2015-03-31 – 2015-04-01 (×2): 2 [IU] via SUBCUTANEOUS
  Administered 2015-04-01: 1 [IU] via SUBCUTANEOUS
  Administered 2015-04-01: 3 [IU] via SUBCUTANEOUS
  Administered 2015-04-02 (×2): 2 [IU] via SUBCUTANEOUS
  Administered 2015-04-02: 3 [IU] via SUBCUTANEOUS
  Administered 2015-04-03 (×3): 2 [IU] via SUBCUTANEOUS
  Administered 2015-04-04: 1 [IU] via SUBCUTANEOUS
  Administered 2015-04-04: 2 [IU] via SUBCUTANEOUS
  Administered 2015-04-04: 5 [IU] via SUBCUTANEOUS
  Administered 2015-04-05: 2 [IU] via SUBCUTANEOUS
  Administered 2015-04-05: 3 [IU] via SUBCUTANEOUS
  Administered 2015-04-05: 5 [IU] via SUBCUTANEOUS
  Administered 2015-04-06: 1 [IU] via SUBCUTANEOUS
  Administered 2015-04-06: 7 [IU] via SUBCUTANEOUS
  Administered 2015-04-06: 3 [IU] via SUBCUTANEOUS
  Administered 2015-04-07: 5 [IU] via SUBCUTANEOUS
  Administered 2015-04-07: 2 [IU] via SUBCUTANEOUS
  Administered 2015-04-07: 3 [IU] via SUBCUTANEOUS

## 2015-03-27 NOTE — ED Provider Notes (Addendum)
CSN: [id number]     Arrival date & time 03/27/15  1138 History   First MD Initiated Contact with Patient 03/27/15 1158     Chief Complaint  Patient presents with  . Diarrhea  . Neck Pain     (Consider location/radiation/quality/duration/timing/severity/associated sxs/prior Treatment) Patient is a 50 y.o. female presenting with diarrhea and neck pain. The history is provided by the patient (The patient states that she started with vomiting on Thursday afternoon she also has had some chills and she's had some diarrhea and a nylon diarrhea patient complains of abdominal discomfort and mild left neck tenderness).  Diarrhea Quality:  Semi-solid Severity:  Mild Onset quality:  Sudden Timing:  Intermittent Progression:  Unchanged Worsened by:  Nothing tried Associated symptoms: abdominal pain and vomiting   Associated symptoms: no headaches   Neck Pain Associated symptoms: no chest pain and no headaches     Past Medical History  Diagnosis Date  . Hypertension   . Diabetes mellitus without complication [facility])    Past Surgical History  Procedure Laterality Date  . Breast surgery    . Uterine fibroid surgery     No family history on file. Social History  Substance Use Topics  . Smoking status: Never Smoker   . Smokeless tobacco: None  . Alcohol Use: No   OB History    No data available     Review of Systems  Constitutional: Negative for appetite change and fatigue.  HENT: Negative for congestion, ear discharge and sinus pressure.   Eyes: Negative for discharge.  Respiratory: Negative for cough.   Cardiovascular: Negative for chest pain.  Gastrointestinal: Positive for vomiting, abdominal pain and diarrhea.  Genitourinary: Negative for frequency and hematuria.  Musculoskeletal: Positive for neck pain. Negative for back pain.  Skin: Negative for rash.  Neurological: Negative for seizures and headaches.  Psychiatric/Behavioral: Negative for hallucinations.       Allergies  Review of patient's allergies indicates no known allergies.  Home Medications   Prior to Admission medications   Medication Sig Start Date End Date Taking? Authorizing Provider  acetaminophen (TYLENOL) 325 MG tablet Take 325 mg by mouth every 6 (six) hours as needed for mild pain or moderate pain.   Yes Historical Provider, MD  hydrochlorothiazide (HYDRODIURIL) 25 MG tablet Take 25 mg by mouth daily. 03/08/15  Yes Historical Provider, MD  losartan (COZAAR) 100 MG tablet Take 100 mg by mouth daily. 03/08/15  Yes Historical Provider, MD  metFORMIN (GLUCOPHAGE) 500 MG tablet Take 500 mg by mouth 2 (two) times daily. 03/08/15  Yes Historical Provider, MD  naproxen sodium (ANAPROX) 220 MG tablet Take 220 mg by mouth 2 (two) times daily as needed (pain).   Yes Historical Provider, MD   BP 106/70 mmHg  Pulse 114  Temp(Src) 98.8 F (37.1 C) (Oral)  Resp 22  Ht 5\' 6"  (1.676 m)  Wt 174 lb (78.926 kg)  BMI 28.10 kg/m2  SpO2 98% Physical Exam  Constitutional: She is oriented to person, place, and time. She appears well-developed.  HENT:  Head: Normocephalic.  Mild tenderness to left lateral neck. But neck supple  Eyes: Conjunctivae and EOM are normal. No scleral icterus.  Neck: Neck supple. No thyromegaly present.  Cardiovascular: Normal rate and regular rhythm.  Exam reveals no gallop and no friction rub.   No murmur heard. Pulmonary/Chest: No stridor. She has no wheezes. She has no rales. She exhibits no tenderness.  Abdominal: She exhibits no distension. There is tenderness. There is no  rebound.  Mild tenderness in left upper quadrant and right upper quadrant  Musculoskeletal: Normal range of motion. She exhibits no edema.  Lymphadenopathy:    She has no cervical adenopathy.  Neurological: She is oriented to person, place, and time. She exhibits normal muscle tone. Coordination normal.  Skin: No rash noted. No erythema.  Psychiatric: She has a normal mood and  affect. Her behavior is normal.    ED Course  Procedures (including critical care time) Labs Review Labs Reviewed  COMPREHENSIVE METABOLIC PANEL - Abnormal; Notable for the following:    Sodium 133 (*)    Chloride 91 (*)    Glucose, Bld 188 (*)    BUN 60 (*)    Creatinine, Ser 5.25 (*)    Calcium 8.3 (*)    Albumin 3.2 (*)    Total Bilirubin 2.1 (*)    GFR calc non Af Amer 9 (*)    GFR calc Af Amer 10 (*)    Anion gap 20 (*)    All other components within normal limits  CBC - Abnormal; Notable for the following:    WBC 21.9 (*)    Hemoglobin 11.4 (*)    HCT 33.6 (*)    All other components within normal limits  URINALYSIS, ROUTINE W REFLEX MICROSCOPIC (NOT AT [facility]) - Abnormal; Notable for the following:    Color, Urine AMBER (*)    APPearance TURBID (*)    Hgb urine dipstick SMALL (*)    Bilirubin Urine SMALL (*)    Ketones, ur 15 (*)    Protein, ur 30 (*)    Leukocytes, UA SMALL (*)    All other components within normal limits  DIFFERENTIAL - Abnormal; Notable for the following:    Neutro Abs 21.1 (*)    Lymphs Abs 0.4 (*)    All other components within normal limits  URINE MICROSCOPIC-ADD ON - Abnormal; Notable for the following:    Squamous Epithelial / LPF 0-5 (*)    Bacteria, UA FEW (*)    All other components within normal limits  I-STAT CG4 LACTIC ACID, ED - Abnormal; Notable for the following:    Lactic Acid, Venous 2.81 (*)    All other components within normal limits  I-STAT BETA HCG BLOOD, ED ([facility], [facility], [facility] ONLY) - Abnormal; Notable for the following:    I-stat hCG, quantitative 48.2 (*)    All other components within normal limits  CULTURE, BLOOD (ROUTINE X 2)  CULTURE, BLOOD (ROUTINE X 2)  URINE CULTURE  LIPASE, BLOOD  PREGNANCY, URINE  LACTIC ACID, PLASMA  LACTIC ACID, PLASMA  LACTIC ACID, PLASMA  PROCALCITONIN  PROTIME-INR  APTT  HCG, QUANTITATIVE, PREGNANCY    Imaging Review No results found. I have personally reviewed and evaluated these  images and lab results as part of my medical decision-making.   EKG Interpretation None     CRITICAL CARE Performed by: Israa Caban L Total critical care time 40 minutes Critical care time was exclusive of separately billable procedures and treating other patients. Critical care was necessary to treat or prevent imminent or life-threatening deterioration. Critical care was time spent personally by me on the following activities: development of treatment plan with patient and/or surrogate as well as nursing, discussions with consultants, evaluation of patient's response to treatment, examination of patient, obtaining history from patient or surrogate, ordering and performing treatments and interventions, ordering and review of laboratory studies, ordering and review of radiographic studies, pulse oximetry and re-evaluation of patient's condition.  MDM   Final diagnoses:  Vomiting and diarrhea    Patient hypotensive renal failure leukocytosis. Suspect renal failure is related to dehydration. Initially was going get a CT scan of the abdomen to evaluate vomiting and pain but patient i-STAT hCG was positive. Patient will get an ultrasound of the abdomen and pelvis. Internal medicine will admit patient for renal failure. Patient has had blood cultures and urine cultures also    Milton Ferguson, MD 03/27/15 1541  Milton Ferguson, MD 03/27/15 [phone]

## 2015-03-27 NOTE — ED Notes (Signed)
Number left for RN on [place]

## 2015-03-27 NOTE — Consult Note (Addendum)
Renal Service Consult Note [facility]  [place] 03/27/2015 Craig D Requesting Physician:  Dr Roderic Palau    Reason for Consult:  Acute renal failure HPI: The patient is a 50 y.o. year-old with hx of DM (no meds) , HTN and uterine fibroids who presented to ED with 2 day hx of N/V/D, mostly vomiting.  She was feeling fine until 2 d ago , Thurs am, when she developed N/V and some diarrhea. Stopped her BP meds and HCTZ at that time.  Has had some chills, and some abd pain as well as sore throat.  Denies joint pain, aches, high fever, URI symptoms. Making less urine than usual. No dysuria or hematuria. No hx renal issues or renal disease. Takes nsaid's sometimes, not a lot recently. Had stopped her ARB and diuretics when she got sick 2 days ago.    Here in the past for uterine fibroid surgery, for lap LOA and for breast reduction surgery. Husband is an interventional nephrologist in [place], [place].    Past Medical History  Past Medical History  Diagnosis Date  . Hypertension   . Diabetes mellitus without complication [facility])    Past Surgical History  Past Surgical History  Procedure Laterality Date  . Breast surgery    . Uterine fibroid surgery     Family History No family history on file. Social History  reports that she has never smoked. She does not have any smokeless tobacco history on file. She reports that she does not drink alcohol or use illicit drugs. Allergies No Known Allergies Home medications Prior to Admission medications   Medication Sig Start Date End Date Taking? Authorizing Provider  acetaminophen (TYLENOL) 325 MG tablet Take 325 mg by mouth every 6 (six) hours as needed for mild pain or moderate pain.   Yes Historical Provider, MD  hydrochlorothiazide (HYDRODIURIL) 25 MG tablet Take 25 mg by mouth daily. 03/08/15  Yes Historical Provider, MD  losartan (COZAAR) 100 MG tablet Take 100 mg by mouth daily. 03/08/15  Yes Historical Provider, MD   metFORMIN (GLUCOPHAGE) 500 MG tablet Take 500 mg by mouth 2 (two) times daily. 03/08/15  Yes Historical Provider, MD  naproxen sodium (ANAPROX) 220 MG tablet Take 220 mg by mouth 2 (two) times daily as needed (pain).   Yes Historical Provider, MD   Liver Function Tests  Recent Labs Lab 03/27/15 1152  AST 41  ALT 31  ALKPHOS 115  BILITOT 2.1*  PROT 7.2  ALBUMIN 3.2*    Recent Labs Lab 03/27/15 1152  LIPASE 22   CBC  Recent Labs Lab 03/27/15 1152  WBC 21.9*  NEUTROABS 21.1*  HGB 11.4*  HCT 33.6*  MCV 82.0  PLT 0000000   Basic Metabolic Panel  Recent Labs Lab 03/27/15 1152  NA 133*  K 3.9  CL 91*  CO2 22  GLUCOSE 188*  BUN 60*  CREATININE 5.25*  CALCIUM 8.3*    Filed Vitals:   03/27/15 1545 03/27/15 1600 03/27/15 1615 03/27/15 1630  BP: 104/68 115/68 104/65 103/71  Pulse: 116 121 112 117  Temp:      TempSrc:      Resp: 16 17 17 17   Height:      Weight:      SpO2: 97% 99% 96% 99%   Exam Alert no distress No rash, cyanosis or gangrene Sclera anicteric, throat clear w/o exudate NO jvd or nodes, L post/ lat neck muscles tense and tender to palpation No fluctuance or wounds/ drainage Chest clear  bilat RRR no MRG Abd soft ND mild diffuse tenderness no rebound  No hsm GU deferred MS no joint effusions Ext no LE or UE edema Neuro alert, calm Ox 3  UA amber, 1.023, 5.0, 0-5 rbc, 6-30 wbc, 0-5 epis, 30 prot Urine preg test - neg CXR no active disease EKG sinus tach, no acute changes, normal ekg other than rate BUN 60 Creat 5.25  PCT 98   LA 2.8  WBC 21.9  Hb 11.4  plt 176 Alb 3.2 Ca 8.3  LFT's ok  Glu 188   Assessment: 1 Acute renal failure - in pt with acute GI illness on losartan and nsaids.  Urine not suggestive of GN (low rbc, low prot). Suspect functional AKI due to vol depletion, hypotension and ARB's. Need to r/o obstruction, on her way to [place] now.  Agree with IVF"s, avoid acei/ arb's/ nsaids/ contrast.  Avoid vanc, substitute Cubicin.  Renal  US. Will follow.   2 L neck pain - looks like muscle spasm. Ordered US left neck to eval for hidden abscess , other cause of pain.  3 N/V/D / abd pain/ ^WBC - for abd [place] today. Can't do CT as it appears she may be pregnant.   4 HTN meds on hold, BP's low 5 +quant HCG - follow per primary    Plan - as above  Kelly Splinter MD [place] pager [phone]    cell [phone] 03/27/2015, 4:38 PM

## 2015-03-27 NOTE — ED Notes (Signed)
Pt in from home c/o L sided neck pain, chills, n/v/d, pt  Reports LUQ & RUQ abd pain, pt states, "I was cold on Thursday. I started throwing up yesterday & having neck pain with diarrhea and throwing up." pt denies SOB, CP, pt A&O x4, follows commands, speaks in complete sentences, pt reports x 4 vomiting & x 4 diarrhea episodes

## 2015-03-27 NOTE — ED Notes (Signed)
Pt still at US

## 2015-03-27 NOTE — ED Notes (Signed)
[place] called- states patient will be least.

## 2015-03-27 NOTE — ED Notes (Signed)
Attempted second IV stick for blood cultures and abx x2 without success. Carla RN to attempt.

## 2015-03-27 NOTE — H&P (Addendum)
[facility] History and Physical  Matty Deamer MWU:[id number] DOB: [birth year] DOA: 03/27/2015  Referring physician: Dr. Roderic Palau PCP: [name], MD   Chief Complaint: Diarrhea and neck pain  HPI:  [name] is a 50 y.o. female, with DM, HTN, and a history of ovarian cysts and fibroids. She presents to the ER with upper abdominal pain, vomiting and severe left neck stiffness. The patient reports she had problems with her left shoulder in early December. She was prescribed prednisone and took it for 8 days but stopped due to elevated blood sugars and nausea. This past Thursday 1/12, the patient began feeling nauseated and "spitting up". Yesterday 1/13 the patient developed severe neck stiffness, diarrhea and significant upper abdominal pain. She took one dose of Tylenol and Aleve for the neck pain. Her diarrhea was worse with eating. Today she was urinating very little, vomited, and her left sided neck pain was even worse.   In the emergency department labs show a white count of 21.9 with a left shift., lactic acid of 2.81 (after 2 L), creatinine of 5.25, BUN 60, bilirubin 2.1. Her anion gap is 20. I-stat hCG is 48.2. Patient notes that her last period was October 2015 upon further investigation.  Review of Systems  Constitutional: Positive for fever, chills and malaise/fatigue.  Eyes: Negative.  Respiratory: Negative.  Cardiovascular: Negative.  Gastrointestinal: Positive for nausea, vomiting, abdominal pain and diarrhea. Negative for blood in stool.  Genitourinary: Positive for dysuria.  Musculoskeletal: Positive for neck pain.  Skin: Negative for itching and rash.  Neurological: Negative.  Endo/Heme/Allergies: Negative.  Psychiatric/Behavioral: Negative.     Past Medical History  Diagnosis Date  . Hypertension   . Diabetes mellitus without complication [facility])      Past Surgical History  Procedure Laterality Date  . Breast surgery    . Uterine fibroid  surgery        Social History:  reports that she has never smoked. She does not have any smokeless tobacco history on file. She reports that she does not drink alcohol or use illicit drugs. Where does patient live--home and with whom if at home? Husband and 2 kids Can patient participate in ADLs?  No Known Allergies  No family history on file.     Prior to Admission medications   Medication Sig Start Date End Date Taking? Authorizing Provider  acetaminophen (TYLENOL) 325 MG tablet Take 325 mg by mouth every 6 (six) hours as needed for mild pain or moderate pain.   Yes Historical Provider, MD  hydrochlorothiazide (HYDRODIURIL) 25 MG tablet Take 25 mg by mouth daily. 03/08/15  Yes Historical Provider, MD  losartan (COZAAR) 100 MG tablet Take 100 mg by mouth daily. 03/08/15  Yes Historical Provider, MD  metFORMIN (GLUCOPHAGE) 500 MG tablet Take 500 mg by mouth 2 (two) times daily. 03/08/15  Yes Historical Provider, MD  naproxen sodium (ANAPROX) 220 MG tablet Take 220 mg by mouth 2 (two) times daily as needed (pain).   Yes Historical Provider, MD     Physical Exam: Filed Vitals:   03/27/15 1545 03/27/15 1600 03/27/15 1615 03/27/15 1630  BP: 104/68 115/68 104/65 103/71  Pulse: 116 121 112 117  Temp:      TempSrc:      Resp: 16 17 17 17   Height:      Weight:      SpO2: 97% 99% 96% 99%   General: Wd, Wn, lying in bed in NAD, husband and children at bedside  Psych: Normal affect and  insight, Not Suicidal or Homicidal, Awake Alert, Oriented X 3.  Neuro: No F.N deficits, ALL C.Nerves Intact, Strength 5/5 all 4 extremities  ENT: Ears and Eyes appear Normal, Conjunctivae clear, PER. Moist oral mucosa without erythema or exudates.  Neck: Stiff with decreased range of motion, left posterior tendon is tender to palpation and rigid, No lymphadenopathy appreciated  Respiratory: Symmetrical chest wall movement, Good air movement bilaterally, CTAB.  Cardiac: RRR, No Murmurs, no  LE edema noted, no JVD.   Abdomen: Slight distension, +epigastric and LLQ tenderness, +bs  Skin: No Cyanosis, Normal Skin Turgor, No Skin Rash or Bruise.  Extremities: Able to move all 4. 5/5 strength in each, no effusions.  Data Review   Micro Results No results found for this or any previous visit (from the past 240 hour(s)).  Radiology Reports Dg Chest Port 1 View  03/27/2015  CLINICAL DATA:  Fever for 3 days EXAM: PORTABLE CHEST 1 VIEW COMPARISON:  None. FINDINGS: Lungs are clear. Heart is upper normal in size with pulmonary vascularity within normal limits. No adenopathy. No bone lesions. IMPRESSION: No edema or consolidation. Electronically Signed   By: Lowella Grip III M.D.   On: 03/27/2015 15:43     CBC  Recent Labs Lab 03/27/15 1152  WBC 21.9*  HGB 11.4*  HCT 33.6*  PLT 176  MCV 82.0  MCH 27.8  MCHC 33.9  RDW 13.3  LYMPHSABS 0.4*  MONOABS 0.3  EOSABS 0.0  BASOSABS 0.0    Chemistries   Recent Labs Lab 03/27/15 1152  NA 133*  K 3.9  CL 91*  CO2 22  GLUCOSE 188*  BUN 60*  CREATININE 5.25*  CALCIUM 8.3*  AST 41  ALT 31  ALKPHOS 115  BILITOT 2.1*   ------------------------------------------------------------------------------------------------------------------ estimated creatinine clearance is 13.6 mL/min (by C-G formula based on Cr of 5.25). ------------------------------------------------------------------------------------------------------------------ No results for input(s): HGBA1C in the last 72 hours. ------------------------------------------------------------------------------------------------------------------ No results for input(s): CHOL, HDL, LDLCALC, TRIG, CHOLHDL, LDLDIRECT in the last 72 hours. ------------------------------------------------------------------------------------------------------------------ No results for input(s): TSH, T4TOTAL, T3FREE, THYROIDAB in the last 72 hours.  Invalid input(s):  FREET3 ------------------------------------------------------------------------------------------------------------------ No results for input(s): VITAMINB12, FOLATE, FERRITIN, TIBC, IRON, RETICCTPCT in the last 72 hours.  Coagulation profile  Recent Labs Lab 03/27/15 1216  INR 1.41    No results for input(s): DDIMER in the last 72 hours.  Cardiac Enzymes No results for input(s): CKMB, TROPONINI, MYOGLOBIN in the last 168 hours.  Invalid input(s): CK ------------------------------------------------------------------------------------------------------------------ Invalid input(s): POCBNP   CBG: No results for input(s): GLUCAP in the last 168 hours.     EKG: Independently reviewed.   Assessment/Plan Principal Problem: Abdominal pain, vomiting, and diarrhea: Acute. Ultrasounds of the abdomen and pelvis shows  no signs to point to cause of patient's acute symptoms. Question of possibility of underlying infectious process such as viral versus bacterial in nature with elevated WBC and lactic acid level of 2.8 on admission. - Admit to MedSurg bed - Empric abx of Zoysn and Daptomycin( will need ID consult to continue) husband is nephrologist and did not want Vanc with acute renal injury. - Consider CT of abdomen and pelvis if symptoms not improved in a.m.  - Zofran as needed  N/V - strict ins on outs   Acute renal failure (ARF): Baseline creatinine thought to be within normal limits found acutely elevated at 5.26 with a BUN of 60. Suspect aspect of dehydration. Nephrology consulted. Ultrasounds of abdomen and pelvis showed signs of medical renal disease and no signs  of obstruction. - checking FeNa - continue IVF - Hold nephrotoxic agents like patient's HCTZ - Follow-up nephrology recommendations  Diabetes mellitus type 2: Last hemoglobin A1c is unknown - Held metformin  - Check Hbg a1c   - CBGs every hour hours with sensitive sliding scale insulin   Leukocytosis:  Question  the possibility of underlying infection. Initially questioned the possibility of sepsis considering patient's elevated heart rate, WBC count 21.9, and lactic acid of 2.81. - check CRP esr  - repeat cbc in am  Neck rigidity: Acute. Cause unknown. Ultrasound of neck showed subcentimeter lymph nodes, but no acute process - continue to monitor  Elevated beta-HCG: Less likely to to represent a possible pregnancy given history of last menstrual cycle being over one year ago.  Consultants Called: Nephrology, Dr. Bridget Hartshorn will see the patient.  Code Status:   full Family Communication: bedside Disposition Plan: admit   Total time spent 55 minutes.Greater than 50% of this time was spent in counseling, explanation of diagnosis, planning of further management, and coordination of care  [facility] Pager [phone]  If 7PM-7AM, please contact night-coverage [email or web address] Password [email or web address] 03/27/2015, 5:09 PM

## 2015-03-27 NOTE — ED Notes (Signed)
Carla RN attempted 2 IV sticks with blood return once and none second time. Upon saline flush vein blown. Blood used for I-stat and one culture set. Phlebotomy notified for second draw. IV team order placed to establish second IV line.

## 2015-03-27 NOTE — ED Notes (Signed)
Not enough urine for creatine and sodium

## 2015-03-27 NOTE — ED Notes (Signed)
Attempted report 

## 2015-03-28 ENCOUNTER — Encounter (HOSPITAL_COMMUNITY): Payer: Self-pay | Admitting: Internal Medicine

## 2015-03-28 DIAGNOSIS — M25561 Pain in right knee: Secondary | ICD-10-CM

## 2015-03-28 DIAGNOSIS — N289 Disorder of kidney and ureter, unspecified: Secondary | ICD-10-CM

## 2015-03-28 DIAGNOSIS — R7881 Bacteremia: Secondary | ICD-10-CM

## 2015-03-28 DIAGNOSIS — B955 Unspecified streptococcus as the cause of diseases classified elsewhere: Secondary | ICD-10-CM | POA: Diagnosis present

## 2015-03-28 DIAGNOSIS — M25512 Pain in left shoulder: Secondary | ICD-10-CM

## 2015-03-28 DIAGNOSIS — E119 Type 2 diabetes mellitus without complications: Secondary | ICD-10-CM

## 2015-03-28 DIAGNOSIS — B9689 Other specified bacterial agents as the cause of diseases classified elsewhere: Secondary | ICD-10-CM

## 2015-03-28 DIAGNOSIS — M542 Cervicalgia: Secondary | ICD-10-CM

## 2015-03-28 DIAGNOSIS — M25521 Pain in right elbow: Secondary | ICD-10-CM

## 2015-03-28 DIAGNOSIS — L040 Acute lymphadenitis of face, head and neck: Secondary | ICD-10-CM

## 2015-03-28 DIAGNOSIS — M2550 Pain in unspecified joint: Secondary | ICD-10-CM

## 2015-03-28 DIAGNOSIS — M25511 Pain in right shoulder: Secondary | ICD-10-CM

## 2015-03-28 DIAGNOSIS — I10 Essential (primary) hypertension: Secondary | ICD-10-CM | POA: Diagnosis present

## 2015-03-28 DIAGNOSIS — M25531 Pain in right wrist: Secondary | ICD-10-CM

## 2015-03-28 LAB — CBC
HCT: 27.9 % — ABNORMAL LOW (ref 36.0–46.0)
Hemoglobin: 9.2 g/dL — ABNORMAL LOW (ref 12.0–15.0)
MCH: 27.1 pg (ref 26.0–34.0)
MCHC: 33 g/dL (ref 30.0–36.0)
MCV: 82.1 fL (ref 78.0–100.0)
PLATELETS: 157 10*3/uL (ref 150–400)
RBC: 3.4 MIL/uL — AB (ref 3.87–5.11)
RDW: 13.3 % (ref 11.5–15.5)
WBC: 18.2 10*3/uL — ABNORMAL HIGH (ref 4.0–10.5)

## 2015-03-28 LAB — RENAL FUNCTION PANEL
ANION GAP: 13 (ref 5–15)
Albumin: 2.2 g/dL — ABNORMAL LOW (ref 3.5–5.0)
Albumin: 2.4 g/dL — ABNORMAL LOW (ref 3.5–5.0)
Anion gap: 13 (ref 5–15)
BUN: 41 mg/dL — ABNORMAL HIGH (ref 6–20)
BUN: 31 mg/dL — AB (ref 6–20)
CALCIUM: 7.7 mg/dL — AB (ref 8.9–10.3)
CALCIUM: 7.1 mg/dL — AB (ref 8.9–10.3)
CO2: 20 mmol/L — AB (ref 22–32)
CO2: 20 mmol/L — AB (ref 22–32)
CREATININE: 1.72 mg/dL — AB (ref 0.44–1.00)
CREATININE: 2.52 mg/dL — AB (ref 0.44–1.00)
Chloride: 104 mmol/L (ref 101–111)
Chloride: 105 mmol/L (ref 101–111)
GFR calc Af Amer: 24 mL/min — ABNORMAL LOW (ref >60)
GFR calc non Af Amer: 21 mL/min — ABNORMAL LOW (ref >60)
GFR calc non Af Amer: 34 mL/min — ABNORMAL LOW (ref >60)
GFR, EST AFRICAN AMERICAN: 39 mL/min — AB (ref >60)
GLUCOSE: 135 mg/dL — AB (ref 65–99)
GLUCOSE: 141 mg/dL — AB (ref 65–99)
Phosphorus: 3.6 mg/dL (ref 2.5–4.6)
Phosphorus: 2.9 mg/dL (ref 2.5–4.6)
Potassium: 3.7 mmol/L (ref 3.5–5.1)
Potassium: 3.9 mmol/L (ref 3.5–5.1)
SODIUM: 138 mmol/L (ref 135–145)
SODIUM: 137 mmol/L (ref 135–145)

## 2015-03-28 LAB — URINALYSIS, ROUTINE W REFLEX MICROSCOPIC
Glucose, UA: NEGATIVE mg/dL
KETONES UR: NEGATIVE mg/dL
NITRITE: NEGATIVE
PH: 5.5 (ref 5.0–8.0)
Protein, ur: 100 mg/dL — AB
Specific Gravity, Urine: 1.017 (ref 1.005–1.030)

## 2015-03-28 LAB — BASIC METABOLIC PANEL
Anion gap: 21 — ABNORMAL HIGH (ref 5–15)
BUN: 28 mg/dL — AB (ref 6–20)
CO2: 19 mmol/L — ABNORMAL LOW (ref 22–32)
Calcium: 7.7 mg/dL — ABNORMAL LOW (ref 8.9–10.3)
Chloride: 114 mmol/L — ABNORMAL HIGH (ref 101–111)
Creatinine, Ser: 1.77 mg/dL — ABNORMAL HIGH (ref 0.44–1.00)
GFR calc Af Amer: 38 mL/min — ABNORMAL LOW (ref >60)
GFR, EST NON AFRICAN AMERICAN: 32 mL/min — AB (ref >60)
GLUCOSE: 139 mg/dL — AB (ref 65–99)
POTASSIUM: 4.3 mmol/L (ref 3.5–5.1)
Sodium: 154 mmol/L — ABNORMAL HIGH (ref 135–145)

## 2015-03-28 LAB — CK: Total CK: 179 U/L (ref 38–234)

## 2015-03-28 LAB — HEPATIC FUNCTION PANEL
ALT: 27 U/L (ref 14–54)
AST: 38 U/L (ref 15–41)
Albumin: 2.3 g/dL — ABNORMAL LOW (ref 3.5–5.0)
Alkaline Phosphatase: 107 U/L (ref 38–126)
BILIRUBIN DIRECT: 1.3 mg/dL — AB (ref 0.1–0.5)
BILIRUBIN TOTAL: 3.1 mg/dL — AB (ref 0.3–1.2)
Indirect Bilirubin: 1.8 mg/dL — ABNORMAL HIGH (ref 0.3–0.9)
Total Protein: 6.3 g/dL — ABNORMAL LOW (ref 6.5–8.1)

## 2015-03-28 LAB — GLUCOSE, CAPILLARY
GLUCOSE-CAPILLARY: 132 mg/dL — AB (ref 65–99)
GLUCOSE-CAPILLARY: 114 mg/dL — AB (ref 65–99)
GLUCOSE-CAPILLARY: 86 mg/dL (ref 65–99)
GLUCOSE-CAPILLARY: 148 mg/dL — AB (ref 65–99)
Glucose-Capillary: 70 mg/dL (ref 65–99)

## 2015-03-28 LAB — TSH: TSH: 0.534 u[IU]/mL (ref 0.350–4.500)

## 2015-03-28 LAB — URINE MICROSCOPIC-ADD ON

## 2015-03-28 LAB — MAGNESIUM: Magnesium: 1.7 mg/dL (ref 1.7–2.4)

## 2015-03-28 LAB — LACTIC ACID, PLASMA: LACTIC ACID, VENOUS: 1.6 mmol/L (ref 0.5–2.0)

## 2015-03-28 MED ORDER — HYDROMORPHONE HCL 1 MG/ML IJ SOLN
1.0000 mg | INTRAMUSCULAR | Status: DC | PRN
  Administered 2015-03-28 (×2): 1 mg via INTRAVENOUS
  Administered 2015-03-29 – 2015-03-30 (×5): 2 mg via INTRAVENOUS
  Administered 2015-03-30 – 2015-03-31 (×2): 1 mg via INTRAVENOUS
  Administered 2015-03-31 – 2015-04-01 (×2): 2 mg via INTRAVENOUS
  Administered 2015-04-01: 1 mg via INTRAVENOUS
  Administered 2015-04-01 – 2015-04-02 (×2): 2 mg via INTRAVENOUS
  Administered 2015-04-03: 1 mg via INTRAVENOUS
  Administered 2015-04-03 – 2015-04-04 (×4): 2 mg via INTRAVENOUS
  Filled 2015-03-28 (×4): qty 2
  Filled 2015-03-28: qty 1
  Filled 2015-03-28 (×3): qty 2
  Filled 2015-03-28: qty 1
  Filled 2015-03-28 (×4): qty 2
  Filled 2015-03-28 (×2): qty 1
  Filled 2015-03-28 (×2): qty 2
  Filled 2015-03-28: qty 1
  Filled 2015-03-28 (×2): qty 2
  Filled 2015-03-28: qty 1

## 2015-03-28 MED ORDER — METOPROLOL TARTRATE 12.5 MG HALF TABLET
12.5000 mg | ORAL_TABLET | Freq: Two times a day (BID) | ORAL | Status: DC
  Administered 2015-03-28 – 2015-03-29 (×3): 12.5 mg via ORAL
  Filled 2015-03-28 (×3): qty 1

## 2015-03-28 MED ORDER — DAPTOMYCIN 500 MG IV SOLR
6.0000 mg/kg | INTRAVENOUS | Status: DC
  Filled 2015-03-28: qty 9.53

## 2015-03-28 MED ORDER — HYDROMORPHONE HCL 1 MG/ML IJ SOLN: 0.5000 mg | INTRAMUSCULAR | Status: DC | PRN

## 2015-03-28 NOTE — Progress Notes (Signed)
Patient arrived on unit via stretcher. Patient alert and oriented x4. Patient oriented to room, unit and staff. No family at bedside. Patient placed on telemetry monitor, CCMD notified. Skin Assessment completed with two RN's, no skin issues noted. Patient's IV clean, dry and infusing. Patient rates pain 0/10. Safety Fall Prevention Plan was given, discussed and signed by patient. Orders have been reviewed and implemented. Call light has been placed within reach. RN will continue to monitor the patient.   Nena Polio BSN, RN  Phone Number: [phone]

## 2015-03-28 NOTE — Progress Notes (Signed)
Addendum: Called by RN, multiple runs of V Tach per central tele, pt is stable, sitting in chair, BP ok, sinus tachycardia Strips with Vtach noted, K ok, mag level ordered, ordered low dose Po metoprolol Asked Cards Dr.Qureshi to eval  Domenic Polite, MD [phone]

## 2015-03-28 NOTE — Consult Note (Signed)
[facility]    Date of Admission:  03/27/2015           Day 1 daptomycin        Day 1 piperacillin tazobactam       Reason for Consult: Bacteremia    Referring Physician: Dr. Domenic Polite  Principal Problem:   Streptococcal bacteremia Active Problems:   Posterior cervical adenopathy   Polyarthralgia   Acute renal failure (ARF) (HCC)   Abdominal pain, vomiting, and diarrhea   Leukocytosis   Diabetes mellitus ([place])   Hypertension   . [START ON 03/29/2015] DAPTOmycin (CUBICIN)  IV  6 mg/kg Intravenous Q48H  . heparin  5,000 Units Subcutaneous 3 times per day  . insulin aspart  0-9 Units Subcutaneous TID WC  . insulin aspart  3 Units Subcutaneous TID WC    Recommendations: 1. Continue daptomycin pending final blood culture results 2. Discontinue piperacillin tazobactam 3. Repeat blood cultures   Assessment: She has developed acute streptococcal or enterococcal bacteremia and may have disseminated infection. She has an unusual focal finding of left posterior cervical adenitis. I do not think any of them are suppurative at this time. I do not think she has meningitis. She may have early septic arthritis but I do not see significant effusion to tap at this time. I do not hear a murmur or see any splinter or conjunctival hemorrhages to suggest endocarditis but she may very well need an echocardiogram soon. Her abdominal symptoms have resolved but she may benefit from an abdominal CT if she is not improving and the source for her bacteremia is unclear. Her husband, who is a nephrologist, wanted to avoid vancomycin because of her acute renal insufficiency. Her renal function is improving rapidly but I will continue daptomycin pending final blood culture results. I will stop piperacillin tazobactam now.    HPI: [name] is a 50 y.o. female who had sudden onset of rigors 2 days ago followed by epigastric pain, nausea, vomiting and mild  diarrhea. She then began to have severe left-sided posterior neck pain leading her to come to the hospital yesterday. She had low-grade fever of 100.5 and acute renal insufficiency. Her chest x-ray was negative. Abdominal ultrasound was normal with the exception of questionable cholelithiasis. Her urinalysis did show some pyuria but she has not been having any urinary tract symptoms. She was started on empiric daptomycin and piperacillin tazobactam (vancomycin was avoided because of concerns about potential nephrotoxicity). Both admission blood cultures are already growing gram-positive cocci in pairs and chains. Her abdominal pain, nausea, vomiting and diarrhea have completely resolved. She continues to have left-sided neck pain but it is slightly better. Over the past 24 hours she has developed joint pains. It is most severe in her right shoulder, right elbow and right wrist but she also has some milder pain in her left shoulder and right knee.  Review of Systems: Review of Systems  Constitutional: Positive for fever, chills and malaise/fatigue. Negative for weight loss and diaphoresis.  HENT: Negative for sore throat.        Severe left posterior neck pain.  Eyes: Negative for blurred vision and photophobia.  Respiratory: Negative for cough, sputum production and shortness of breath.   Cardiovascular: Negative for chest pain.  Gastrointestinal: Positive for nausea, vomiting, abdominal pain and diarrhea. Negative for heartburn.  Genitourinary: Negative for dysuria, urgency, frequency and flank pain.  Musculoskeletal: Positive for joint pain and neck pain.  Negative for myalgias.  Skin: Negative for rash.  Neurological: Negative for focal weakness and headaches.    Past Medical History  Diagnosis Date  . Hypertension   . Diabetes mellitus without complication [facility])     Social History  Substance Use Topics  . Smoking status: Never Smoker   . Smokeless tobacco: None  . Alcohol Use: No     No family history on file. No Known Allergies  OBJECTIVE: Blood pressure 114/70, pulse 117, temperature 99.3 F (37.4 C), temperature source Oral, resp. rate 17, height 5\' 6"  (1.676 m), weight 175 lb (79.379 kg), SpO2 96 %.  Physical Exam  Constitutional: She is oriented to person, place, and time.  She is sitting up in a chair. She is in moderate distress due to right arm pain. Her husband is at the bedside.  HENT:  Mouth/Throat: No oropharyngeal exudate.  She has firm, matted left posterior cervical lymph nodes that are tender to palpation. I do not see any scalp lesions. Her teeth are in good condition.  Eyes: Conjunctivae are normal.  Neck:  I do not believe that she has meningismus.  Cardiovascular: Regular rhythm.   No murmur heard. She is tachycardic.  Pulmonary/Chest: Effort normal and breath sounds normal. She has no wheezes. She has no rales.  Abdominal: Soft. Bowel sounds are normal. She exhibits no mass. There is no tenderness.  Musculoskeletal:  She is exquisitely tender around her right shoulder, right elbow and right wrist. However, I do not notice any unusual warmth or redness. She may have very slight swelling around her right wrist.  Lymphadenopathy:    She has cervical adenopathy.  Neurological: She is alert and oriented to person, place, and time.  Skin: No rash noted.  Psychiatric: Mood and affect normal.    Lab Results Lab Results  Component Value Date   WBC 18.2* 03/28/2015   HGB 9.2* 03/28/2015   HCT 27.9* 03/28/2015   MCV 82.1 03/28/2015   PLT 157 03/28/2015    Lab Results  Component Value Date   CREATININE 1.72* 03/28/2015   BUN 31* 03/28/2015   NA 138 03/28/2015   K 3.9 03/28/2015   CL 105 03/28/2015   CO2 20* 03/28/2015    Lab Results  Component Value Date   ALT 27 03/28/2015   AST 38 03/28/2015   ALKPHOS 107 03/28/2015   BILITOT 3.1* 03/28/2015     Microbiology: Recent Results (from the past 240 hour(s))  Urine culture      Status: None (Preliminary result)   Collection Time: 03/27/15 12:42 PM  Result Value Ref Range Status   Specimen Description URINE, CLEAN CATCH  Final   Special Requests Normal  Final   Culture TOO YOUNG TO READ  Final   Report Status PENDING  Incomplete  Culture, blood (Routine X 2) w Reflex to ID Panel     Status: None (Preliminary result)   Collection Time: 03/27/15  2:25 PM  Result Value Ref Range Status   Specimen Description BLOOD LEFT ANTECUBITAL  Final   Special Requests BOTTLES DRAWN AEROBIC ONLY 5CC  Final   Culture  Setup Time   Final    GRAM POSITIVE COCCI IN PAIRS IN CHAINS AEROBIC BOTTLE ONLY CRITICAL RESULT CALLED TO, READ BACK BY AND VERIFIED WITH: A. MINTZ,RN AT [id number] ON [id number] BY Rhea Bleacher    Culture PENDING  Incomplete   Report Status PENDING  Incomplete  Culture, blood (Routine X 2) w Reflex to ID Panel  Status: None (Preliminary result)   Collection Time: 03/27/15  3:00 PM  Result Value Ref Range Status   Specimen Description BLOOD RIGHT HAND  Final   Special Requests BOTTLES DRAWN AEROBIC AND ANAEROBIC 5CC  Final   Culture  Setup Time   Final    GRAM POSITIVE COCCI IN PAIRS IN CHAINS IN BOTH AEROBIC AND ANAEROBIC BOTTLES CRITICAL RESULT CALLED TO, READ BACK BY AND VERIFIED WITH: A. MINTZ,RN AT 0848 ON [id number] BY Rhea Bleacher    Culture PENDING  Incomplete   Report Status PENDING  Incomplete    Michel Bickers, MD [facility] [place] [facility] [phone] pager   [phone] cell 03/28/2015, 2:34 PM

## 2015-03-28 NOTE — Progress Notes (Signed)
Pt. Had 15 beat of Vtach.  Central Telemetry saved strip.  MD notified.  Jillyn Ledger, MBA, BS, RN

## 2015-03-28 NOTE — Progress Notes (Addendum)
[facility] Progress Note   Subjective: n/v, abd pain completely gone, better. No sig sore throat, L neck still tender but better. Has new severe pain in R wrist > R elbow > R shoulder. Can't move R arm due to pain.  Blood cx's are + 2/2 for GPC in pairs/ chains. Tmax 100.5. Creat down 2.5 late last night and 1.7 at 10 am today.    Filed Vitals:   03/27/15 1939 03/27/15 2028 03/28/15 0529 03/28/15 0823  BP: 102/70 98/62 94/73  114/70  Pulse: 121 123 115 117  Temp: 98.5 F (36.9 C) 100.5 F (38.1 C) 98.6 F (37 C) 99.3 F (37.4 C)  TempSrc: Oral   Oral  Resp: 16 20 18 17   Height:      Weight:  79.379 kg (175 lb)    SpO2: 97% 99% 94% 96%    Inpatient medications: . [START ON 03/29/2015] DAPTOmycin (CUBICIN)  IV  6 mg/kg Intravenous Q48H  . heparin  5,000 Units Subcutaneous 3 times per day  . insulin aspart  0-9 Units Subcutaneous TID WC  . insulin aspart  3 Units Subcutaneous TID WC   . sodium chloride 150 mL/hr at 03/28/15 0551   HYDROmorphone (DILAUDID) injection  Exam: Alert no distress Sclera anicteric, throat clear w/o exudate NO jvd, L post/ lat neck muscles less tense and less tender to palpation Chest clear bilat RRR soft early SEM rusb, no rub or gallop, tachy Abd soft today, tenderness resolved, no hsm MS small effusion R wrist, R wrist/ elbow/ shoulder severe pain w joint movement Ext no LE or UE edema, no skin lesions Neuro alert, Ox 3  UA amber, 1.023, 5.0, 0-5 rbc, 6-30 wbc, 0-5 epis, 30 prot CXR no active disease EKG sinus tach, no acute changes, normal ekg other than rate Alb 3.2 Ca 8.3 LFT's ok Glu 188 Abd [place] > 12-13 cm kidneys , echogenic c/w medical renal dz, no hydro  Urine sediment 1/15 > 15-30 wbc's/ 0-2 rbc's per hpf, several fine gran casts noted, no other casts noted  (no rbc/ wbc casts). Few rbc's noted are nondysmorphic.   Assessment: 1 Acute renal failure - urine sediment consistent w ATN and/or infection. No suspicious  elements for GN. Creat improving w IVF's and abx, and holding ARB.  UCx results pending. Gram +bacteremia on dapto/ zosyn. Good UOP, cont IVF's. Infectious w/u in progress, have d/w primary MD 2 R wrist/ elbow/ shoulder pain - acute, ?polyarthritis related to infection 3 N/V/abd pain - resolved  4 HTN holding ARB/ hctz, BP's soft 5 +quant HCG - pelvic US neg for pregnancy  Plan - as above   Kelly Splinter MD [facility] pager [phone]    cell [phone] 03/28/2015, 11:20 AM    Recent Labs Lab 03/27/15 1851 03/28/15 0015 03/28/15 0944  NA 135 137 138  K 4.0 3.7 3.9  CL 102 104 105  CO2 20* 20* 20*  GLUCOSE 143* 141* 135*  BUN 53* 41* 31*  CREATININE 3.45* 2.52* 1.72*  CALCIUM 7.1* 7.1* 7.7*  PHOS  --  3.6 2.9    Recent Labs Lab 03/27/15 1152 03/28/15 0015 03/28/15 0944  AST 41  --   --   ALT 31  --   --   ALKPHOS 115  --   --   BILITOT 2.1*  --   --   PROT 7.2  --   --   ALBUMIN 3.2* 2.2* 2.4*    Recent Labs Lab 03/27/15 1152  03/28/15 0943  WBC 21.9* 18.2*  NEUTROABS 21.1*  --   HGB 11.4* 9.2*  HCT 33.6* 27.9*  MCV 82.0 82.1  PLT 176 157

## 2015-03-28 NOTE — Progress Notes (Signed)
Dr.Joseph texted results of blood culture. No new orders.

## 2015-03-28 NOTE — Progress Notes (Signed)
[facility] PROGRESS NOTE  [name] HZ:[id number] DOB: [birth year] DOA: 03/27/2015 PCP: [name], MD  Assessment/Plan: 1. Sepsis/GPC bacteremia -source not clear strep vs enterococcus, continue IV Daptomycin and Zosyn -will get ID input, if enterococcal will get CT abd pelvis without IV contrast, unless creatinine much better -continue IVF, supportive care  2. AKI  -due to sepsis, poor Po intake, NSAIDs, ARB -Baseline creatinine thought to be within normal limits, on admit creatinine  5.26  -Nephrology consulted. Ultrasounds of abdomen and pelvis showed signs of medical renal disease and no signs of obstruction. -continue IVF today  3. DM -metformin on hold, SSI, FU hbaic  4. Elevated B-HCG, she is not pregnant, LMP>1year ago, menopausal now, Pelvic US negative for gestational sac -suspect this could be from her pituitary gland, needs to be followed, GYn  As outpatient  5. R shoulder, elbow, wrist pain -suspect polyarthritis due to above infection, monitor clinically  6. HTN -stable, holding ARB due to AKI  DVt proph: Hep SQ  Code Status: Full Code Family Communication: none at bedside Disposition Plan: Pending stability, improvement     HPI/Subjective: C/o pain in neck, shoulders, elbows etc  Objective: Filed Vitals:   03/28/15 0529 03/28/15 0823  BP: 94/73 114/70  Pulse: 115 117  Temp: 98.6 F (37 C) 99.3 F (37.4 C)  Resp: 18 17    Intake/Output Summary (Last 24 hours) at 03/28/15 1359 Last data filed at 03/28/15 1311  Gross per 24 hour  Intake   2745 ml  Output   2300 ml  Net    445 ml   Filed Weights   03/27/15 1152 03/27/15 2028  Weight: 78.926 kg (174 lb) 79.379 kg (175 lb)    Exam:   General:  AAOx3, ill appearing  Cardiovascular: S1S2/RRR  Respiratory: CTAB  Abdomen: soft, Mildly distended, BS present  Musculoskeletal: no edema , swelling and tenderness of both shoulder, elbow, wrist  Data Reviewed: Basic  Metabolic Panel:  Recent Labs Lab 03/27/15 1152 03/27/15 1851 03/28/15 0015 03/28/15 0944  NA 133* 135 137 138  K 3.9 4.0 3.7 3.9  CL 91* 102 104 105  CO2 22 20* 20* 20*  GLUCOSE 188* 143* 141* 135*  BUN 60* 53* 41* 31*  CREATININE 5.25* 3.45* 2.52* 1.72*  CALCIUM 8.3* 7.1* 7.1* 7.7*  PHOS  --   --  3.6 2.9   Liver Function Tests:  Recent Labs Lab 03/27/15 1152 03/28/15 0015 03/28/15 0944 03/28/15 1125  AST 41  --   --  38  ALT 31  --   --  27  ALKPHOS 115  --   --  107  BILITOT 2.1*  --   --  3.1*  PROT 7.2  --   --  6.3*  ALBUMIN 3.2* 2.2* 2.4* 2.3*    Recent Labs Lab 03/27/15 1152  LIPASE 22   No results for input(s): AMMONIA in the last 168 hours. CBC:  Recent Labs Lab 03/27/15 1152 03/28/15 0943  WBC 21.9* 18.2*  NEUTROABS 21.1*  --   HGB 11.4* 9.2*  HCT 33.6* 27.9*  MCV 82.0 82.1  PLT 176 157   Cardiac Enzymes:  Recent Labs Lab 03/28/15 0943  CKTOTAL 179   BNP (last 3 results) No results for input(s): BNP in the last 8760 hours.  ProBNP (last 3 results) No results for input(s): PROBNP in the last 8760 hours.  CBG:  Recent Labs Lab 03/27/15 2027 03/28/15 0759 03/28/15 1156  GLUCAP 104* 132* 114*  Recent Results (from the past 240 hour(s))  Urine culture     Status: None (Preliminary result)   Collection Time: 03/27/15 12:42 PM  Result Value Ref Range Status   Specimen Description URINE, CLEAN CATCH  Final   Special Requests Normal  Final   Culture TOO YOUNG TO READ  Final   Report Status PENDING  Incomplete  Culture, blood (Routine X 2) w Reflex to ID Panel     Status: None (Preliminary result)   Collection Time: 03/27/15  2:25 PM  Result Value Ref Range Status   Specimen Description BLOOD LEFT ANTECUBITAL  Final   Special Requests BOTTLES DRAWN AEROBIC ONLY 5CC  Final   Culture  Setup Time   Final    GRAM POSITIVE COCCI IN PAIRS IN CHAINS AEROBIC BOTTLE ONLY CRITICAL RESULT CALLED TO, READ BACK BY AND VERIFIED WITH:  A. MINTZ,RN AT [id number] ON [id number] BY Rhea Bleacher    Culture PENDING  Incomplete   Report Status PENDING  Incomplete  Culture, blood (Routine X 2) w Reflex to ID Panel     Status: None (Preliminary result)   Collection Time: 03/27/15  3:00 PM  Result Value Ref Range Status   Specimen Description BLOOD RIGHT HAND  Final   Special Requests BOTTLES DRAWN AEROBIC AND ANAEROBIC 5CC  Final   Culture  Setup Time   Final    GRAM POSITIVE COCCI IN PAIRS IN CHAINS IN BOTH AEROBIC AND ANAEROBIC BOTTLES CRITICAL RESULT CALLED TO, READ BACK BY AND VERIFIED WITH: A. MINTZ,RN AT 0848 ON [id number] BY Rhea Bleacher    Culture PENDING  Incomplete   Report Status PENDING  Incomplete     Studies: US Soft Tissue Head/neck  03/27/2015  CLINICAL DATA:  Left-sided neck pain EXAM: ULTRASOUND LEFT NECK TECHNIQUE: Longitudinal and transverse images of the left neck region in the area of pain performed COMPARISON:  None. FINDINGS: Sonographic images of the left neck show no mass or inflammatory focus. No abnormal fluid collection. In particular, no abscess. Several tiny lymph nodes are noted in this area; by size criteria, there is no adenopathy in this area. Vascular structures of this area are widely patent. IMPRESSION: Scattered subcentimeter lymph nodes in the left neck, not meeting size criteria for pathologic significance. No mass or inflammatory focus appreciable. No abscess in particular. Electronically Signed   By: Lowella Grip III M.D.   On: 03/27/2015 17:19   US Abdomen Complete  03/27/2015  CLINICAL DATA:  Septic, generalized upper and lower abdominal pain, positive pregnancy test EXAM: ABDOMEN ULTRASOUND COMPLETE COMPARISON:  None FINDINGS: Gallbladder: Dependent echogenic material with minimal shadowing likely calculi in gallbladder. No gallbladder wall thickening, pericholecystic fluid, or sonographic Murphy sign. Common bile duct: Diameter: Normal caliber 4 mm diameter Liver: Normal appearance IVC: Normal  appearance Pancreas: Distal tail obscured by bowel gas. Visualized portion normal appearance. Spleen: Normal appearance, 8.5 cm length Right Kidney: Length: 12.5 cm. Increased cortical echogenicity. Normal cortical thickness. No mass or hydronephrosis. Left Kidney: Length: 12.7 cm. Increased cortical echogenicity. Normal cortical thickness. No mass or hydronephrosis. Abdominal aorta: Normal caliber Other findings: No free fluid IMPRESSION: Probable medical renal disease changes. Probable cholelithiasis without evidence acute cholecystitis. Remainder of exam unremarkable. Electronically Signed   By: Lavonia Dana M.D.   On: 03/27/2015 17:29   US Ob Comp Less 14 Wks  03/27/2015  CLINICAL DATA:  Low beta HCG of 27. Abdominal pain with sepsis. Elevated white blood cell count. EXAM: OBSTETRIC <14 WK [place] AND  TRANSVAGINAL OB US TECHNIQUE: Both transabdominal and transvaginal ultrasound examinations were performed for complete evaluation of the gestation as well as the maternal uterus, adnexal regions, and pelvic cul-de-sac. Transvaginal technique was performed to assess early pregnancy. COMPARISON:  Ultrasound 02/13/2012 FINDINGS: Intrauterine gestational sac: None Yolk sac:  Not present Embryo:  Not present Maternal uterus/adnexae: The uterus is lobular and heterogeneous echotexture consistent multiple leiomyoma. This appears similar to the ultrasound of 2013. The endometrium is difficult to identified on the background of the multiple leiomyoma but measures 8 mm. Additionally, the ovaries are not identified. There is reported history of a LEFT oophorectomy in 2008. No free fluid. IMPRESSION: 1. Leiomyomatous uterus similar to 2013. 2. No decidual reaction or gestational sac identified. 3. No intrauterine gestational sac, yolk sac, or fetal pole identified. Differential considerations include intrauterine pregnancy too early to be sonographically visualized, missed abortion, or ectopic pregnancy. Followup ultrasound is  recommended in 10-14 days for further evaluation. Electronically Signed   By: Suzy Bouchard M.D.   On: 03/27/2015 18:07   US Ob Transvaginal  03/27/2015  CLINICAL DATA:  Low beta HCG of 27. Abdominal pain with sepsis. Elevated white blood cell count. EXAM: OBSTETRIC <14 WK [place] AND TRANSVAGINAL OB US TECHNIQUE: Both transabdominal and transvaginal ultrasound examinations were performed for complete evaluation of the gestation as well as the maternal uterus, adnexal regions, and pelvic cul-de-sac. Transvaginal technique was performed to assess early pregnancy. COMPARISON:  Ultrasound 02/13/2012 FINDINGS: Intrauterine gestational sac: None Yolk sac:  Not present Embryo:  Not present Maternal uterus/adnexae: The uterus is lobular and heterogeneous echotexture consistent multiple leiomyoma. This appears similar to the ultrasound of 2013. The endometrium is difficult to identified on the background of the multiple leiomyoma but measures 8 mm. Additionally, the ovaries are not identified. There is reported history of a LEFT oophorectomy in 2008. No free fluid. IMPRESSION: 1. Leiomyomatous uterus similar to 2013. 2. No decidual reaction or gestational sac identified. 3. No intrauterine gestational sac, yolk sac, or fetal pole identified. Differential considerations include intrauterine pregnancy too early to be sonographically visualized, missed abortion, or ectopic pregnancy. Followup ultrasound is recommended in 10-14 days for further evaluation. Electronically Signed   By: Suzy Bouchard M.D.   On: 03/27/2015 18:07   Dg Chest Port 1 View  03/27/2015  CLINICAL DATA:  Fever for 3 days EXAM: PORTABLE CHEST 1 VIEW COMPARISON:  None. FINDINGS: Lungs are clear. Heart is upper normal in size with pulmonary vascularity within normal limits. No adenopathy. No bone lesions. IMPRESSION: No edema or consolidation. Electronically Signed   By: Lowella Grip III M.D.   On: 03/27/2015 15:43    Scheduled Meds: . [START  ON 03/29/2015] DAPTOmycin (CUBICIN)  IV  6 mg/kg Intravenous Q48H  . heparin  5,000 Units Subcutaneous 3 times per day  . insulin aspart  0-9 Units Subcutaneous TID WC  . insulin aspart  3 Units Subcutaneous TID WC   Continuous Infusions: . sodium chloride 150 mL/hr at 03/28/15 0551   Antibiotics Given (last 72 hours)    Date/Time Action Medication Dose Rate   03/27/15 1853 Given   DAPTOmycin (CUBICIN) 473.5 mg in sodium chloride 0.9 % IVPB 473.5 mg 218.9 mL/hr      Principal Problem:   Streptococcal bacteremia Active Problems:   Acute renal failure (ARF) (HCC)   Abdominal pain, vomiting, and diarrhea   Neck rigidity   Leukocytosis   Diabetes mellitus ([place])   Hypertension    Time spent: 14min  [facility]  [facility] Pager [phone]. If 7PM-7AM, please contact night-coverage at [email or web address], password [facility] 03/28/2015, 1:59 PM  LOS: 1 day

## 2015-03-28 NOTE — Progress Notes (Addendum)
ANTIBIOTIC CONSULT NOTE - INITIAL  Pharmacy Consult for Daptomycin Indication: Bacteremia  No Known Allergies  Patient Measurements: Height: 5\' 6"  (167.6 cm) Weight: 175 lb (79.379 kg) IBW/kg (Calculated) : 59.3  Vital Signs: Temp: 98.6 F (37 C) (01/15 0529) BP: 94/73 mmHg (01/15 0529) Pulse Rate: 115 (01/15 0529) Intake/Output from previous day: 01/14 0701 - 01/15 0700 In: 2505 [P.O.:840; I.V.:1665] Out: 1800 [Urine:1800] Intake/Output from this shift:    Labs:  Recent Labs  03/27/15 1152 03/27/15 1806 03/27/15 1851 03/28/15 0015  WBC 21.9*  --   --   --   HGB 11.4*  --   --   --   PLT 176  --   --   --   LABCREA  --  76.57  --   --   CREATININE 5.25*  --  3.45* 2.52*   Estimated Creatinine Clearance: 28.4 mL/min (by C-G formula based on Cr of 2.52). No results for input(s): VANCOTROUGH, VANCOPEAK, VANCORANDOM, GENTTROUGH, GENTPEAK, GENTRANDOM, TOBRATROUGH, TOBRAPEAK, TOBRARND, AMIKACINPEAK, AMIKACINTROU, AMIKACIN in the last 72 hours.   Microbiology: Recent Results (from the past 720 hour(s))  Culture, blood (Routine X 2) w Reflex to ID Panel     Status: None (Preliminary result)   Collection Time: 03/27/15  2:25 PM  Result Value Ref Range Status   Specimen Description BLOOD LEFT ANTECUBITAL  Final   Special Requests BOTTLES DRAWN AEROBIC ONLY 5CC  Final   Culture  Setup Time   Final    GRAM POSITIVE COCCI IN PAIRS IN CHAINS AEROBIC BOTTLE ONLY CRITICAL RESULT CALLED TO, READ BACK BY AND VERIFIED WITH: A. MINTZ,RN AT [id number] ON [id number] BY Rhea Bleacher    Culture PENDING  Incomplete   Report Status PENDING  Incomplete    Medical History: Past Medical History  Diagnosis Date  . Hypertension   . Diabetes mellitus without complication (HCC)     Medications:  Scheduled:  . heparin  5,000 Units Subcutaneous 3 times per day  . insulin aspart  0-9 Units Subcutaneous TID WC  . insulin aspart  3 Units Subcutaneous TID WC   Infusions:  . sodium chloride 150  mL/hr at 03/28/15 0551   PRN: HYDROmorphone (DILAUDID) injection Assessment: 19 yom presented with upper abdominal pain, vomiting and severe left neck stiffness. Pharmacy is consulted to dose daptomycin for bacteremia.   WBC 21.9, PCT 98.22, Lac 2.8>2.0>1.6, T 98.6 SCr 5.25>2.52, CrCl ~25-30   Daptomycin/Zosyn x 1 on 1/14 @1900  Dapto 1/14>> Zosyn 1/14 >> 1/14  1/14 BC GPC in pairs in chains 1/14 UC sent  Plan:  Daptomycin 6 mg/kg Q48h Monitor renal function, s/sx of worsening infection Monitor CK weekly  F/U culture results   Bennye Alm, PharmD Pharmacy Resident [phone] 03/28/2015,8:15 AM

## 2015-03-28 NOTE — Progress Notes (Signed)
Utilization review completed.  

## 2015-03-28 NOTE — Consult Note (Addendum)
CARDIOLOGY INPATIENT CONSULTATION NOTE  Patient ID: [name] MRN: [id number], DOB/AGE: 09/05/1965   Admit date: 03/27/2015   Primary Physician: [name], MD Primary Cardiologist: unassigned  Reason for Consult:   NSVT  Requesting Physician: Domenic Polite MD  HPI: This is a 50 y.o. female from [place] with no prior history of CAD but history of DM2, HTN who presented with epigastric pain, nausea, vomiting and severe left neck stiffness yesterday. Patient said that she has been having these pains off and on since December. 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 Source is still unkonwn. She is on Abx. However she developed several episodes of NSVT in the evening around 5 pm today without significant symptoms and cardiology was consulted for evaluation and treatment. She was also found to have AKi with cr of 5.3 on arrival. US kidney showed medical renal disease without obstruction. She was on ARB which has been held 2/2 AKI.  Her electrolytes are within normal limits. She does not have prior history of CAD. She only felt some palpitations at the time of VT. She denied chest pain, dyspnea, PND, orthopnea, leg swelling, increased abdominal girth, syncope, presyncope, palpitations.  Patient denied any prior history of syncope, cardiomyopathy, thyroid disease, rhythm disorders or family history of SCD or premature CAD.   Problem List: Past Medical History  Diagnosis Date  . Hypertension   . Diabetes mellitus without complication [facility])     Past Surgical History  Procedure Laterality Date  . Breast surgery    . Uterine fibroid surgery    . Left oophorectomy Left      Allergies: No Known Allergies   Home Medications Current Facility-Administered Medications  Medication Dose Route Frequency Provider Last Rate Last Dose  . 0.9 %  sodium chloride infusion   Intravenous  Continuous Domenic Polite, MD 125 mL/hr at 03/28/15 1554 125 mL/hr at 03/28/15 1554  . [START ON 03/29/2015] DAPTOmycin (CUBICIN) 476.5 mg in sodium chloride 0.9 % IVPB  6 mg/kg Intravenous Q48H Kelsy E Combs, RPH      . heparin injection 5,000 Units  5,000 Units Subcutaneous 3 times per day Norval Morton, MD   5,000 Units at 03/28/15 1256  . HYDROmorphone (DILAUDID) injection 1-2 mg  1-2 mg Intravenous Q2H PRN Domenic Polite, MD   1 mg at 03/28/15 1553  . insulin aspart (novoLOG) injection 0-9 Units  0-9 Units Subcutaneous TID WC Melton Alar, PA-C   1 Units at 03/28/15 1027  . insulin aspart (novoLOG) injection 3 Units  3 Units Subcutaneous TID WC Melton Alar, PA-C   3 Units at 03/28/15 1732  . metoprolol tartrate (LOPRESSOR) tablet 12.5 mg  12.5 mg Oral BID Domenic Polite, MD   12.5 mg at 03/28/15 1732    Family history Not significant for premature CAD or Sudden cardiac death  Social History   Social History  . Marital Status: Married    Spouse Name: N/A  . Number of Children: N/A  . Years of Education: N/A   Occupational History  . Not on file.   Social History Main Topics  . Smoking status: Never Smoker   . Smokeless tobacco: Not on file  . Alcohol Use: No  . Drug Use: No  . Sexual Activity: Not on file   Other Topics Concern  . Not on file   Social History Narrative  Review of Systems: General: fatigue, fevers, chills and night sweats  Cardiovascular: no chest pain, dyspnea, leg welling Dermatological: negative for rash Respiratory: negative for wheezing  Urologic: negative for hematuria Abdominal: nausea, vomiting, diarrhea  Neurologic: negative for visual changes, syncope, or dizziness Hematology: no easy bruising Psychiatry: non suicidal/homicidal  Musculoskeletal: right arm joint pains  Physical Exam: Vitals: BP 112/77 mmHg  Pulse 120  Temp(Src) 98.5 F (36.9 C) (Oral)  Resp 17  Ht 5\' 6"  (1.676 m)  Wt 79.379 kg (175 lb)  BMI 28.26 kg/m2   SpO2 94% General: not in acute distress Neck: JVP flat, no cannon waves, neck supple Heart: tachycardiac, regular, S1, S2, no murmurs  Lungs: CTAB  GI: non tender, non distended, bowel sounds present Extremities: no edema, right elbow and shoulder joint tenderness and decreased range of motion  Neuro: AAO x 3  Psych: normal affect, no anxiety   Labs:   Results for orders placed or performed during the hospital encounter of 03/27/15 (from the past 24 hour(s))  Basic metabolic panel     Status: Abnormal   Collection Time: 03/27/15  6:51 PM  Result Value Ref Range   Sodium 135 135 - 145 mmol/L   Potassium 4.0 3.5 - 5.1 mmol/L   Chloride 102 101 - 111 mmol/L   CO2 20 (L) 22 - 32 mmol/L   Glucose, Bld 143 (H) 65 - 99 mg/dL   BUN 53 (H) 6 - 20 mg/dL   Creatinine, Ser 3.45 (H) 0.44 - 1.00 mg/dL   Calcium 7.1 (L) 8.9 - 10.3 mg/dL   GFR calc non Af Amer 14 (L) >60 mL/min   GFR calc Af Amer 17 (L) >60 mL/min   Anion gap 13 5 - 15  Glucose, capillary     Status: Abnormal   Collection Time: 03/27/15  8:27 PM  Result Value Ref Range   Glucose-Capillary 104 (H) 65 - 99 mg/dL  Lactic acid, plasma     Status: None   Collection Time: 03/27/15  8:59 PM  Result Value Ref Range   Lactic Acid, Venous 2.0 0.5 - 2.0 mmol/L  Lactic acid, plasma     Status: None   Collection Time: 03/27/15 11:33 PM  Result Value Ref Range   Lactic Acid, Venous 1.6 0.5 - 2.0 mmol/L  Renal function panel     Status: Abnormal   Collection Time: 03/28/15 12:15 AM  Result Value Ref Range   Sodium 137 135 - 145 mmol/L   Potassium 3.7 3.5 - 5.1 mmol/L   Chloride 104 101 - 111 mmol/L   CO2 20 (L) 22 - 32 mmol/L   Glucose, Bld 141 (H) 65 - 99 mg/dL   BUN 41 (H) 6 - 20 mg/dL   Creatinine, Ser 2.52 (H) 0.44 - 1.00 mg/dL   Calcium 7.1 (L) 8.9 - 10.3 mg/dL   Phosphorus 3.6 2.5 - 4.6 mg/dL   Albumin 2.2 (L) 3.5 - 5.0 g/dL   GFR calc non Af Amer 21 (L) >60 mL/min   GFR calc Af Amer 24 (L) >60 mL/min   Anion gap 13 5 -  15  Glucose, capillary     Status: Abnormal   Collection Time: 03/28/15  7:59 AM  Result Value Ref Range   Glucose-Capillary 132 (H) 65 - 99 mg/dL  CK     Status: None   Collection Time: 03/28/15  9:43 AM  Result Value Ref Range   Total CK 179 38 - 234 U/L  CBC  Status: Abnormal   Collection Time: 03/28/15  9:43 AM  Result Value Ref Range   WBC 18.2 (H) 4.0 - 10.5 K/uL   RBC 3.40 (L) 3.87 - 5.11 MIL/uL   Hemoglobin 9.2 (L) 12.0 - 15.0 g/dL   HCT 27.9 (L) 36.0 - 46.0 %   MCV 82.1 78.0 - 100.0 fL   MCH 27.1 26.0 - 34.0 pg   MCHC 33.0 30.0 - 36.0 g/dL   RDW 13.3 11.5 - 15.5 %   Platelets 157 150 - 400 K/uL  Renal function panel     Status: Abnormal   Collection Time: 03/28/15  9:44 AM  Result Value Ref Range   Sodium 138 135 - 145 mmol/L   Potassium 3.9 3.5 - 5.1 mmol/L   Chloride 105 101 - 111 mmol/L   CO2 20 (L) 22 - 32 mmol/L   Glucose, Bld 135 (H) 65 - 99 mg/dL   BUN 31 (H) 6 - 20 mg/dL   Creatinine, Ser 1.72 (H) 0.44 - 1.00 mg/dL   Calcium 7.7 (L) 8.9 - 10.3 mg/dL   Phosphorus 2.9 2.5 - 4.6 mg/dL   Albumin 2.4 (L) 3.5 - 5.0 g/dL   GFR calc non Af Amer 34 (L) >60 mL/min   GFR calc Af Amer 39 (L) >60 mL/min   Anion gap 13 5 - 15  Hepatic function panel     Status: Abnormal   Collection Time: 03/28/15 11:25 AM  Result Value Ref Range   Total Protein 6.3 (L) 6.5 - 8.1 g/dL   Albumin 2.3 (L) 3.5 - 5.0 g/dL   AST 38 15 - 41 U/L   ALT 27 14 - 54 U/L   Alkaline Phosphatase 107 38 - 126 U/L   Total Bilirubin 3.1 (H) 0.3 - 1.2 mg/dL   Bilirubin, Direct 1.3 (H) 0.1 - 0.5 mg/dL   Indirect Bilirubin 1.8 (H) 0.3 - 0.9 mg/dL  Glucose, capillary     Status: Abnormal   Collection Time: 03/28/15 11:56 AM  Result Value Ref Range   Glucose-Capillary 114 (H) 65 - 99 mg/dL  Glucose, capillary     Status: None   Collection Time: 03/28/15  4:43 PM  Result Value Ref Range   Glucose-Capillary 86 65 - 99 mg/dL     Radiology/Studies: US Soft Tissue Head/neck  03/27/2015  CLINICAL  DATA:  Left-sided neck pain EXAM: ULTRASOUND LEFT NECK TECHNIQUE: Longitudinal and transverse images of the left neck region in the area of pain performed COMPARISON:  None. FINDINGS: Sonographic images of the left neck show no mass or inflammatory focus. No abnormal fluid collection. In particular, no abscess. Several tiny lymph nodes are noted in this area; by size criteria, there is no adenopathy in this area. Vascular structures of this area are widely patent. IMPRESSION: Scattered subcentimeter lymph nodes in the left neck, not meeting size criteria for pathologic significance. No mass or inflammatory focus appreciable. No abscess in particular. Electronically Signed   By: Lowella Grip III M.D.   On: 03/27/2015 17:19   US Abdomen Complete  03/27/2015  CLINICAL DATA:  Septic, generalized upper and lower abdominal pain, positive pregnancy test EXAM: ABDOMEN ULTRASOUND COMPLETE COMPARISON:  None FINDINGS: Gallbladder: Dependent echogenic material with minimal shadowing likely calculi in gallbladder. No gallbladder wall thickening, pericholecystic fluid, or sonographic Murphy sign. Common bile duct: Diameter: Normal caliber 4 mm diameter Liver: Normal appearance IVC: Normal appearance Pancreas: Distal tail obscured by bowel gas. Visualized portion normal appearance. Spleen: Normal appearance, 8.5 cm length  Right Kidney: Length: 12.5 cm. Increased cortical echogenicity. Normal cortical thickness. No mass or hydronephrosis. Left Kidney: Length: 12.7 cm. Increased cortical echogenicity. Normal cortical thickness. No mass or hydronephrosis. Abdominal aorta: Normal caliber Other findings: No free fluid IMPRESSION: Probable medical renal disease changes. Probable cholelithiasis without evidence acute cholecystitis. Remainder of exam unremarkable. Electronically Signed   By: Lavonia Dana M.D.   On: 03/27/2015 17:29   US Ob Comp Less 14 Wks  03/27/2015  CLINICAL DATA:  Low beta HCG of 27. Abdominal pain with  sepsis. Elevated white blood cell count. EXAM: OBSTETRIC <14 WK [place] AND TRANSVAGINAL OB US TECHNIQUE: Both transabdominal and transvaginal ultrasound examinations were performed for complete evaluation of the gestation as well as the maternal uterus, adnexal regions, and pelvic cul-de-sac. Transvaginal technique was performed to assess early pregnancy. COMPARISON:  Ultrasound 02/13/2012 FINDINGS: Intrauterine gestational sac: None Yolk sac:  Not present Embryo:  Not present Maternal uterus/adnexae: The uterus is lobular and heterogeneous echotexture consistent multiple leiomyoma. This appears similar to the ultrasound of 2013. The endometrium is difficult to identified on the background of the multiple leiomyoma but measures 8 mm. Additionally, the ovaries are not identified. There is reported history of a LEFT oophorectomy in 2008. No free fluid. IMPRESSION: 1. Leiomyomatous uterus similar to 2013. 2. No decidual reaction or gestational sac identified. 3. No intrauterine gestational sac, yolk sac, or fetal pole identified. Differential considerations include intrauterine pregnancy too early to be sonographically visualized, missed abortion, or ectopic pregnancy. Followup ultrasound is recommended in 10-14 days for further evaluation. Electronically Signed   By: Suzy Bouchard M.D.   On: 03/27/2015 18:07   US Ob Transvaginal  03/27/2015  CLINICAL DATA:  Low beta HCG of 27. Abdominal pain with sepsis. Elevated white blood cell count. EXAM: OBSTETRIC <14 WK [place] AND TRANSVAGINAL OB US TECHNIQUE: Both transabdominal and transvaginal ultrasound examinations were performed for complete evaluation of the gestation as well as the maternal uterus, adnexal regions, and pelvic cul-de-sac. Transvaginal technique was performed to assess early pregnancy. COMPARISON:  Ultrasound 02/13/2012 FINDINGS: Intrauterine gestational sac: None Yolk sac:  Not present Embryo:  Not present Maternal uterus/adnexae: The uterus is lobular and  heterogeneous echotexture consistent multiple leiomyoma. This appears similar to the ultrasound of 2013. The endometrium is difficult to identified on the background of the multiple leiomyoma but measures 8 mm. Additionally, the ovaries are not identified. There is reported history of a LEFT oophorectomy in 2008. No free fluid. IMPRESSION: 1. Leiomyomatous uterus similar to 2013. 2. No decidual reaction or gestational sac identified. 3. No intrauterine gestational sac, yolk sac, or fetal pole identified. Differential considerations include intrauterine pregnancy too early to be sonographically visualized, missed abortion, or ectopic pregnancy. Followup ultrasound is recommended in 10-14 days for further evaluation. Electronically Signed   By: Suzy Bouchard M.D.   On: 03/27/2015 18:07   Dg Chest Port 1 View  03/27/2015  CLINICAL DATA:  Fever for 3 days EXAM: PORTABLE CHEST 1 VIEW COMPARISON:  None. FINDINGS: Lungs are clear. Heart is upper normal in size with pulmonary vascularity within normal limits. No adenopathy. No bone lesions. IMPRESSION: No edema or consolidation. Electronically Signed   By: Lowella Grip III M.D.   On: 03/27/2015 15:43    EKG: sinus tachycardia, with low voltage QRS with normal QTc Telemetry review: 4 episodes of NSVT ranging from 16 beats to 34 beats  Echo: none Cardiac cath: none  Medical decision making:  Discussed care with the patient Discussed care  with the MD physician on the phone Reviewed labs and imaging personally Reviewed prior records  ASSESSMENT AND PLAN:  This is a 50 y.o. female with no prior history of cardiomyopathy or CAD presented with abdominal symptoms and was found to have GM positive bacteremia. She developed several episodes of NSVT without symptoms today. Source of infection is still not clear.    Principal Problem:   Streptococcal bacteremia Active Problems:   Acute renal failure (ARF) (HCC)   Abdominal pain, vomiting, and diarrhea    Posterior cervical adenopathy   Leukocytosis   Diabetes mellitus (HCC)   Hypertension   Polyarthralgia  Non - Sustained Ventricular tachycardia in the setting of sepsis, asymptomatic  Without prolonged QT/long short sequences, longest 34 beats long (1707 hrs 03/28/2015) Concerned for infective endocarditis/myocarditis, however it could be secondary to AKI and electrolyte abnormalities. Serum studies may lag behind. Consider checking BMP Q 6 hours  Check TSH Keep potassium >4, calcium >9, magnesium >2 Echo in the AM, if abnormal, will need to be evaluated for CAD If normal, can consider discharging with event monitor to evaluate for burden of NSVT after sepsis has resolved and if it persists, will need ischemic evaluation  Consider starting on low dose metoprolol. If patient becomes hypotensive, can consider amiodarone drip overnight until sepsis improves Treat the underlying cause   Signed, Flossie Dibble, MD MS 03/28/2015, 6:20 PM

## 2015-03-28 NOTE — Progress Notes (Signed)
Dr.Joseph texted re :34 beat run of VT per [employer].

## 2015-03-29 ENCOUNTER — Inpatient Hospital Stay (HOSPITAL_COMMUNITY): Payer: Managed Care, Other (non HMO)

## 2015-03-29 DIAGNOSIS — R21 Rash and other nonspecific skin eruption: Secondary | ICD-10-CM

## 2015-03-29 DIAGNOSIS — M542 Cervicalgia: Secondary | ICD-10-CM | POA: Insufficient documentation

## 2015-03-29 DIAGNOSIS — I10 Essential (primary) hypertension: Secondary | ICD-10-CM

## 2015-03-29 DIAGNOSIS — M130 Polyarthritis, unspecified: Secondary | ICD-10-CM

## 2015-03-29 DIAGNOSIS — R509 Fever, unspecified: Secondary | ICD-10-CM

## 2015-03-29 DIAGNOSIS — B954 Other streptococcus as the cause of diseases classified elsewhere: Secondary | ICD-10-CM

## 2015-03-29 DIAGNOSIS — I4729 Other ventricular tachycardia: Secondary | ICD-10-CM | POA: Diagnosis present

## 2015-03-29 DIAGNOSIS — R7881 Bacteremia: Secondary | ICD-10-CM

## 2015-03-29 DIAGNOSIS — I472 Ventricular tachycardia: Secondary | ICD-10-CM

## 2015-03-29 DIAGNOSIS — N179 Acute kidney failure, unspecified: Secondary | ICD-10-CM

## 2015-03-29 DIAGNOSIS — R000 Tachycardia, unspecified: Secondary | ICD-10-CM | POA: Diagnosis present

## 2015-03-29 DIAGNOSIS — B950 Streptococcus, group A, as the cause of diseases classified elsewhere: Secondary | ICD-10-CM

## 2015-03-29 LAB — HEMOGLOBIN A1C
HEMOGLOBIN A1C: 7 % — AB (ref 4.8–5.6)
MEAN PLASMA GLUCOSE: 154 mg/dL

## 2015-03-29 LAB — CBC
HEMATOCRIT: 27.9 % — AB (ref 36.0–46.0)
Hemoglobin: 9.3 g/dL — ABNORMAL LOW (ref 12.0–15.0)
MCH: 27.1 pg (ref 26.0–34.0)
MCHC: 33.3 g/dL (ref 30.0–36.0)
MCV: 81.3 fL (ref 78.0–100.0)
Platelets: 168 10*3/uL (ref 150–400)
RBC: 3.43 MIL/uL — AB (ref 3.87–5.11)
RDW: 14 % (ref 11.5–15.5)
WBC: 17 10*3/uL — AB (ref 4.0–10.5)

## 2015-03-29 LAB — COMPREHENSIVE METABOLIC PANEL
ALK PHOS: 137 U/L — AB (ref 38–126)
ALT: 27 U/L (ref 14–54)
ANION GAP: 8 (ref 5–15)
AST: 32 U/L (ref 15–41)
Albumin: 2.2 g/dL — ABNORMAL LOW (ref 3.5–5.0)
BILIRUBIN TOTAL: 4 mg/dL — AB (ref 0.3–1.2)
BUN: 19 mg/dL (ref 6–20)
CALCIUM: 8.2 mg/dL — AB (ref 8.9–10.3)
CO2: 20 mmol/L — ABNORMAL LOW (ref 22–32)
Chloride: 106 mmol/L (ref 101–111)
Creatinine, Ser: 1.08 mg/dL — ABNORMAL HIGH (ref 0.44–1.00)
GFR, EST NON AFRICAN AMERICAN: 59 mL/min — AB (ref >60)
Glucose, Bld: 198 mg/dL — ABNORMAL HIGH (ref 65–99)
POTASSIUM: 3.4 mmol/L — AB (ref 3.5–5.1)
Sodium: 134 mmol/L — ABNORMAL LOW (ref 135–145)
TOTAL PROTEIN: 6.5 g/dL (ref 6.5–8.1)

## 2015-03-29 LAB — URINE CULTURE
Culture: >=100000
SPECIAL REQUESTS: NORMAL

## 2015-03-29 LAB — GLUCOSE, CAPILLARY
GLUCOSE-CAPILLARY: 203 mg/dL — AB (ref 65–99)
Glucose-Capillary: 140 mg/dL — ABNORMAL HIGH (ref 65–99)
Glucose-Capillary: 171 mg/dL — ABNORMAL HIGH (ref 65–99)

## 2015-03-29 LAB — URIC ACID: URIC ACID, SERUM: 5.3 mg/dL (ref 2.3–6.6)

## 2015-03-29 MED ORDER — PENICILLIN G POTASSIUM 5000000 UNITS IJ SOLR
4.0000 10*6.[IU] | INTRAVENOUS | Status: DC
  Administered 2015-03-29 – 2015-04-03 (×27): 4 10*6.[IU] via INTRAVENOUS
  Filled 2015-03-29 (×42): qty 4

## 2015-03-29 MED ORDER — SODIUM CHLORIDE 0.9 % IV SOLN
480.0000 mg | INTRAVENOUS | Status: DC
  Administered 2015-03-29: 480 mg via INTRAVENOUS
  Filled 2015-03-29: qty 9.6

## 2015-03-29 MED ORDER — POTASSIUM CHLORIDE CRYS ER 20 MEQ PO TBCR
40.0000 meq | EXTENDED_RELEASE_TABLET | Freq: Once | ORAL | Status: AC
  Administered 2015-03-29: 40 meq via ORAL
  Filled 2015-03-29: qty 2

## 2015-03-29 MED ORDER — HEPARIN SODIUM (PORCINE) 5000 UNIT/ML IJ SOLN
5000.0000 [IU] | Freq: Three times a day (TID) | INTRAMUSCULAR | Status: AC
  Administered 2015-03-29: 5000 [IU] via SUBCUTANEOUS
  Filled 2015-03-29: qty 1

## 2015-03-29 MED ORDER — MAGNESIUM SULFATE 2 GM/50ML IV SOLN
2.0000 g | Freq: Once | INTRAVENOUS | Status: AC
  Administered 2015-03-29: 2 g via INTRAVENOUS
  Filled 2015-03-29: qty 50

## 2015-03-29 NOTE — Progress Notes (Addendum)
Subjective: Pt complains of joint pains  Breathing is OK  NO CP  No sore throat   Objective: Filed Vitals:   03/28/15 1644 03/28/15 1802 03/28/15 2025 03/29/15 0558  BP: 109/63 112/77 106/68 141/82  Pulse: 122 120 110 127  Temp: 99.5 F (37.5 C) 98.5 F (36.9 C) 99.9 F (37.7 C) 99.7 F (37.6 C)  TempSrc: Oral Oral Oral Axillary  Resp: 18 17 18 18   Height:      Weight:   80.05 kg (176 lb 7.7 oz)   SpO2: 94% 94% 97% 100%   Weight change: 1.124 kg (2 lb 7.7 oz)  Intake/Output Summary (Last 24 hours) at 03/29/15 0927 Last data filed at 03/29/15 0600  Gross per 24 hour  Intake   3295 ml  Output   1800 ml  Net   1495 ml    General: Alert, awake, oriented x3, in no acute distress Neck:  JVP is normal Heart: Regular rate and rhythm, without murmurs, rubs, gallops.  Lungs: Clear to auscultation.  No rales or wheezes. Exemities:  No edema.    Wrists, elbows warm   Neuro: Grossly intact, nonfocal.  Tele  SR / ST    No further NSVT    Lab Results: Results for orders placed or performed during the hospital encounter of 03/27/15 (from the past 24 hour(s))  CK     Status: None   Collection Time: 03/28/15  9:43 AM  Result Value Ref Range   Total CK 179 38 - 234 U/L  CBC     Status: Abnormal   Collection Time: 03/28/15  9:43 AM  Result Value Ref Range   WBC 18.2 (H) 4.0 - 10.5 K/uL   RBC 3.40 (L) 3.87 - 5.11 MIL/uL   Hemoglobin 9.2 (L) 12.0 - 15.0 g/dL   HCT 27.9 (L) 36.0 - 46.0 %   MCV 82.1 78.0 - 100.0 fL   MCH 27.1 26.0 - 34.0 pg   MCHC 33.0 30.0 - 36.0 g/dL   RDW 13.3 11.5 - 15.5 %   Platelets 157 150 - 400 K/uL  Renal function panel     Status: Abnormal   Collection Time: 03/28/15  9:44 AM  Result Value Ref Range   Sodium 138 135 - 145 mmol/L   Potassium 3.9 3.5 - 5.1 mmol/L   Chloride 105 101 - 111 mmol/L   CO2 20 (L) 22 - 32 mmol/L   Glucose, Bld 135 (H) 65 - 99 mg/dL   BUN 31 (H) 6 - 20 mg/dL   Creatinine, Ser 1.72 (H) 0.44 - 1.00 mg/dL   Calcium 7.7 (L)  8.9 - 10.3 mg/dL   Phosphorus 2.9 2.5 - 4.6 mg/dL   Albumin 2.4 (L) 3.5 - 5.0 g/dL   GFR calc non Af Amer 34 (L) >60 mL/min   GFR calc Af Amer 39 (L) >60 mL/min   Anion gap 13 5 - 15  Hepatic function panel     Status: Abnormal   Collection Time: 03/28/15 11:25 AM  Result Value Ref Range   Total Protein 6.3 (L) 6.5 - 8.1 g/dL   Albumin 2.3 (L) 3.5 - 5.0 g/dL   AST 38 15 - 41 U/L   ALT 27 14 - 54 U/L   Alkaline Phosphatase 107 38 - 126 U/L   Total Bilirubin 3.1 (H) 0.3 - 1.2 mg/dL   Bilirubin, Direct 1.3 (H) 0.1 - 0.5 mg/dL   Indirect Bilirubin 1.8 (H) 0.3 - 0.9 mg/dL  Basic metabolic panel  Status: Abnormal   Collection Time: 03/28/15 11:25 AM  Result Value Ref Range   Sodium 154 (H) 135 - 145 mmol/L   Potassium 4.3 3.5 - 5.1 mmol/L   Chloride 114 (H) 101 - 111 mmol/L   CO2 19 (L) 22 - 32 mmol/L   Glucose, Bld 139 (H) 65 - 99 mg/dL   BUN 28 (H) 6 - 20 mg/dL   Creatinine, Ser 1.77 (H) 0.44 - 1.00 mg/dL   Calcium 7.7 (L) 8.9 - 10.3 mg/dL   GFR calc non Af Amer 32 (L) >60 mL/min   GFR calc Af Amer 38 (L) >60 mL/min   Anion gap 21 (H) 5 - 15  Magnesium     Status: None   Collection Time: 03/28/15 11:25 AM  Result Value Ref Range   Magnesium 1.7 1.7 - 2.4 mg/dL  Glucose, capillary     Status: Abnormal   Collection Time: 03/28/15 11:56 AM  Result Value Ref Range   Glucose-Capillary 114 (H) 65 - 99 mg/dL  Glucose, capillary     Status: None   Collection Time: 03/28/15  4:43 PM  Result Value Ref Range   Glucose-Capillary 86 65 - 99 mg/dL  TSH     Status: None   Collection Time: 03/28/15  8:38 PM  Result Value Ref Range   TSH 0.534 0.350 - 4.500 uIU/mL  Glucose, capillary     Status: None   Collection Time: 03/28/15  9:29 PM  Result Value Ref Range   Glucose-Capillary 70 65 - 99 mg/dL  Glucose, capillary     Status: Abnormal   Collection Time: 03/28/15 10:28 PM  Result Value Ref Range   Glucose-Capillary 148 (H) 65 - 99 mg/dL   Comment 1 Notify RN   Urinalysis,  Routine w reflex microscopic (not at [facility])     Status: Abnormal   Collection Time: 03/28/15 10:34 PM  Result Value Ref Range   Color, Urine AMBER (A) YELLOW   APPearance CLOUDY (A) CLEAR   Specific Gravity, Urine 1.017 1.005 - 1.030   pH 5.5 5.0 - 8.0   Glucose, UA NEGATIVE NEGATIVE mg/dL   Hgb urine dipstick LARGE (A) NEGATIVE   Bilirubin Urine SMALL (A) NEGATIVE   Ketones, ur NEGATIVE NEGATIVE mg/dL   Protein, ur 100 (A) NEGATIVE mg/dL   Nitrite NEGATIVE NEGATIVE   Leukocytes, UA SMALL (A) NEGATIVE  Urine microscopic-add on     Status: Abnormal   Collection Time: 03/28/15 10:34 PM  Result Value Ref Range   Squamous Epithelial / LPF 0-5 (A) NONE SEEN   WBC, UA 6-30 0 - 5 WBC/hpf   RBC / HPF 6-30 0 - 5 RBC/hpf   Bacteria, UA RARE (A) NONE SEEN  CBC     Status: Abnormal   Collection Time: 03/29/15  6:24 AM  Result Value Ref Range   WBC 17.0 (H) 4.0 - 10.5 K/uL   RBC 3.43 (L) 3.87 - 5.11 MIL/uL   Hemoglobin 9.3 (L) 12.0 - 15.0 g/dL   HCT 27.9 (L) 36.0 - 46.0 %   MCV 81.3 78.0 - 100.0 fL   MCH 27.1 26.0 - 34.0 pg   MCHC 33.3 30.0 - 36.0 g/dL   RDW 14.0 11.5 - 15.5 %   Platelets 168 150 - 400 K/uL  Comprehensive metabolic panel     Status: Abnormal   Collection Time: 03/29/15  6:24 AM  Result Value Ref Range   Sodium 134 (L) 135 - 145 mmol/L   Potassium 3.4 (L)  3.5 - 5.1 mmol/L   Chloride 106 101 - 111 mmol/L   CO2 20 (L) 22 - 32 mmol/L   Glucose, Bld 198 (H) 65 - 99 mg/dL   BUN 19 6 - 20 mg/dL   Creatinine, Ser 1.08 (H) 0.44 - 1.00 mg/dL   Calcium 8.2 (L) 8.9 - 10.3 mg/dL   Total Protein 6.5 6.5 - 8.1 g/dL   Albumin 2.2 (L) 3.5 - 5.0 g/dL   AST 32 15 - 41 U/L   ALT 27 14 - 54 U/L   Alkaline Phosphatase 137 (H) 38 - 126 U/L   Total Bilirubin 4.0 (H) 0.3 - 1.2 mg/dL   GFR calc non Af Amer 59 (L) >60 mL/min   GFR calc Af Amer >60 >60 mL/min   Anion gap 8 5 - 15    Studies/Results: No results found.  Medications: Reviewed    @PROBHOSP @  1  NSVT  No recurrence   Echo is reportedly pending   CK was normal   2.  ID  Cultures + for Gr + cocci in chains  ? Strep  Further ID pending on type Pt with fever, arthritis  Strep test not done   Continue on ABX    Echo pending   Will set  Up for TEE tomorrow    3  Arthritis  Orthro contacted for arthocentesis  ? Septic vs reactive  Defer to primary team and renal use of NSAIDS for pain management/ management of inflammation    LOS: 2 days   Dorris Carnes 03/29/2015, 9:27 AM

## 2015-03-29 NOTE — Progress Notes (Signed)
  Echocardiogram 2D Echocardiogram has been performed.  [name] 03/29/2015, 10:17 AM

## 2015-03-29 NOTE — Progress Notes (Addendum)
[facility]   Reason for visit: Follow up on bacteremia  Interval History: now with right shoulder arthralgia, right elbow and right wrist arthralgias since yesterday.  Left shoulder feeling better.  Has been afebrile over 36 hours. Improved renal function. No chills. Associated decreased appetite.    Physical Exam: Constitutional:  Filed Vitals:   03/29/15 0558 03/29/15 1106  BP: 141/82 129/80  Pulse: 127 111  Temp: 99.7 F (37.6 C) 99 F (37.2 C)  Resp: 18 20   patient appears in NAD Respiratory: Normal respiratory effort; CTA B Cardiovascular: RRR MS: no significant warmth of left shoulder, wrist though more warm and edema, elbow not particularly warm either GI: soft, nt Skin: mild erythema over left knee about 1/2 cm, no nodule, no effusion in knee, non-tender, minimal warmth Neruo: non-focal HENT: no thrush  Review of Systems: Constitutional: negative for fevers and chills Musculoskeletal: positive for arthralgias and stiff joints  Lab Results  Component Value Date   WBC 17.0* 03/29/2015   HGB 9.3* 03/29/2015   HCT 27.9* 03/29/2015   MCV 81.3 03/29/2015   PLT 168 03/29/2015    Lab Results  Component Value Date   CREATININE 1.08* 03/29/2015   BUN 19 03/29/2015   NA 134* 03/29/2015   K 3.4* 03/29/2015   CL 106 03/29/2015   CO2 20* 03/29/2015    Lab Results  Component Value Date   ALT 27 03/29/2015   AST 32 03/29/2015   ALKPHOS 137* 03/29/2015     Microbiology: Recent Results (from the past 240 hour(s))  Urine culture     Status: None (Preliminary result)   Collection Time: 03/27/15 12:42 PM  Result Value Ref Range Status   Specimen Description URINE, CLEAN CATCH  Final   Special Requests Normal  Final   Culture TOO YOUNG TO READ  Final   Report Status PENDING  Incomplete  Culture, blood (Routine X 2) w Reflex to ID Panel     Status: None (Preliminary result)   Collection Time: 03/27/15  2:25 PM  Result Value Ref Range  Status   Specimen Description BLOOD LEFT ANTECUBITAL  Final   Special Requests BOTTLES DRAWN AEROBIC ONLY 5CC  Final   Culture  Setup Time   Final    GRAM POSITIVE COCCI IN PAIRS IN CHAINS AEROBIC BOTTLE ONLY CRITICAL RESULT CALLED TO, READ BACK BY AND VERIFIED WITH: A. MINTZ,RN AT 0738 ON [id number] BY Rhea Bleacher    Culture GROUP A STREP (S.PYOGENES) ISOLATED  Final   Report Status PENDING  Incomplete  Culture, blood (Routine X 2) w Reflex to ID Panel     Status: None (Preliminary result)   Collection Time: 03/27/15  3:00 PM  Result Value Ref Range Status   Specimen Description BLOOD RIGHT HAND  Final   Special Requests BOTTLES DRAWN AEROBIC AND ANAEROBIC 5CC  Final   Culture  Setup Time   Final    GRAM POSITIVE COCCI IN PAIRS IN CHAINS IN BOTH AEROBIC AND ANAEROBIC BOTTLES CRITICAL RESULT CALLED TO, READ BACK BY AND VERIFIED WITH: A. MINTZ,RN AT 0848 ON [id number] BY Rhea Bleacher    Culture   Final    GROUP A STREP (S.PYOGENES) ISOLATED SUSCEPTIBILITIES TO FOLLOW    Report Status PENDING  Incomplete  Culture, blood (routine x 2)     Status: None (Preliminary result)   Collection Time: 03/28/15  3:00 PM  Result Value Ref Range Status   Specimen Description BLOOD RIGHT HAND  Final   Special Requests BOTTLES DRAWN AEROBIC AND ANAEROBIC 5CC  Final   Culture NO GROWTH < 24 HOURS  Final   Report Status PENDING  Incomplete  Culture, blood (routine x 2)     Status: None (Preliminary result)   Collection Time: 03/28/15  3:05 PM  Result Value Ref Range Status   Specimen Description BLOOD RIGHT HAND  Final   Special Requests BOTTLES DRAWN AEROBIC ONLY 8.5CC  Final   Culture NO GROWTH < 24 HOURS  Final   Report Status PENDING  Incomplete    Impression/Plan:  1. GAS bacteremia - seems to be from the adenitis.  Repeat blood cultures ngtd.  Overall improvement.   I will narrow to penicillin IV It is very unusual for GAS endocarditis, from an ID standpoint, can defer TEE unless blood cultures  are persistently positive.  I suspect adenitis as source.  2. Polyarthritis - may be secondary to #1, migrating post-infectious vs disseminated septic arthritis.  Can't use NSAIDS due to renal insufficiency which would help differentiate.  May consider a small dose of steroids to see if her arthralgias improve but will check MRI first to see if concerning for septic arthritis.    3. ? Rheumatic fever - does not meet criteria with normal echo and not consistent with it with concurrent positive blood cultures.   4. Rash - small area of erythema not c/w erythema nodosum 5. Routine HIV screening

## 2015-03-29 NOTE — Progress Notes (Signed)
Pharmacy Antibiotic Follow-up Note  [name] is a 50 y.o. year-old female admitted on 03/27/2015.  The patient is currently on day 3 of daptomycin for bacteremia.  Assessment/Plan: The dose of daptomycin will be adjusted to 480mg  IV q24h based on renal function.  Temp (24hrs), Avg:99.4 F (37.4 C), Min:98.5 F (36.9 C), Max:99.9 F (37.7 C)   Recent Labs Lab 03/27/15 1152 03/28/15 0943 03/29/15 0624  WBC 21.9* 18.2* 17.0*    Recent Labs Lab 03/27/15 1851 03/28/15 0015 03/28/15 0944 03/28/15 1125 03/29/15 0624  CREATININE 3.45* 2.52* 1.72* 1.77* 1.08*   Estimated Creatinine Clearance: 66.5 mL/min (by C-G formula based on Cr of 1.08).    No Known Allergies  Antimicrobials this admission: Zosyn 1/14 x 1 Dapto 1/14>>  Levels/dose changes this admission: daptomycin dose changed 1/16 to 480mg  IV q24h from 476.5mg  IV q48h for ease of compounding as well as improvement in rena function.  1/15 CK = 179  Microbiology results: 1/14 BCx: group A strep 1/14 UC: pending 1/15 BCx: sent  Thank you for allowing pharmacy to be a part of this patient's care.  Verna Czech PharmD 03/29/2015 10:50 AM

## 2015-03-29 NOTE — Progress Notes (Signed)
Subjective: Interval History: has complaints shoulder, elbow and wrist pain.  Objective: Vital signs in last 24 hours: Temp:  [98.5 F (36.9 C)-99.9 F (37.7 C)] 99.7 F (37.6 C) (01/16 0558) Pulse Rate:  [110-127] 127 (01/16 0558) Resp:  [17-18] 18 (01/16 0558) BP: (106-141)/(63-82) 141/82 mmHg (01/16 0558) SpO2:  [94 %-100 %] 100 % (01/16 0558) Weight:  [80.05 kg (176 lb 7.7 oz)] 80.05 kg (176 lb 7.7 oz) (01/15 2025) Weight change: 1.124 kg (2 lb 7.7 oz)  Intake/Output from previous day: 01/15 0701 - 01/16 0700 In: 3295 [P.O.:720; I.V.:2575] Out: 1800 [Urine:1800] Intake/Output this shift:    General appearance: alert and cooperative Resp: clear to auscultation bilaterally Cardio: S1, S2 normal GI: soft, non-tender; bowel sounds normal; no masses,  no organomegaly Extremities: Very tender R shoulder with effusion, R elbow and wrist  Lab Results:  Recent Labs  03/28/15 0943 03/29/15 0624  WBC 18.2* 17.0*  HGB 9.2* 9.3*  HCT 27.9* 27.9*  PLT 157 168   BMET:  Recent Labs  03/28/15 1125 03/29/15 0624  NA 154* 134*  K 4.3 3.4*  CL 114* 106  CO2 19* 20*  GLUCOSE 139* 198*  BUN 28* 19  CREATININE 1.77* 1.08*  CALCIUM 7.7* 8.2*   No results for input(s): PTH in the last 72 hours. Iron Studies: No results for input(s): IRON, TIBC, TRANSFERRIN, FERRITIN in the last 72 hours.  Studies/Results: US Soft Tissue Head/neck  03/27/2015  CLINICAL DATA:  Left-sided neck pain EXAM: ULTRASOUND LEFT NECK TECHNIQUE: Longitudinal and transverse images of the left neck region in the area of pain performed COMPARISON:  None. FINDINGS: Sonographic images of the left neck show no mass or inflammatory focus. No abnormal fluid collection. In particular, no abscess. Several tiny lymph nodes are noted in this area; by size criteria, there is no adenopathy in this area. Vascular structures of this area are widely patent. IMPRESSION: Scattered subcentimeter lymph nodes in the left neck,  not meeting size criteria for pathologic significance. No mass or inflammatory focus appreciable. No abscess in particular. Electronically Signed   By: Lowella Grip III M.D.   On: 03/27/2015 17:19   US Abdomen Complete  03/27/2015  CLINICAL DATA:  Septic, generalized upper and lower abdominal pain, positive pregnancy test EXAM: ABDOMEN ULTRASOUND COMPLETE COMPARISON:  None FINDINGS: Gallbladder: Dependent echogenic material with minimal shadowing likely calculi in gallbladder. No gallbladder wall thickening, pericholecystic fluid, or sonographic Murphy sign. Common bile duct: Diameter: Normal caliber 4 mm diameter Liver: Normal appearance IVC: Normal appearance Pancreas: Distal tail obscured by bowel gas. Visualized portion normal appearance. Spleen: Normal appearance, 8.5 cm length Right Kidney: Length: 12.5 cm. Increased cortical echogenicity. Normal cortical thickness. No mass or hydronephrosis. Left Kidney: Length: 12.7 cm. Increased cortical echogenicity. Normal cortical thickness. No mass or hydronephrosis. Abdominal aorta: Normal caliber Other findings: No free fluid IMPRESSION: Probable medical renal disease changes. Probable cholelithiasis without evidence acute cholecystitis. Remainder of exam unremarkable. Electronically Signed   By: Lavonia Dana M.D.   On: 03/27/2015 17:29   US Ob Comp Less 14 Wks  03/27/2015  CLINICAL DATA:  Low beta HCG of 27. Abdominal pain with sepsis. Elevated white blood cell count. EXAM: OBSTETRIC <14 WK [place] AND TRANSVAGINAL OB US TECHNIQUE: Both transabdominal and transvaginal ultrasound examinations were performed for complete evaluation of the gestation as well as the maternal uterus, adnexal regions, and pelvic cul-de-sac. Transvaginal technique was performed to assess early pregnancy. COMPARISON:  Ultrasound 02/13/2012 FINDINGS: Intrauterine gestational sac: None  Yolk sac:  Not present Embryo:  Not present Maternal uterus/adnexae: The uterus is lobular and  heterogeneous echotexture consistent multiple leiomyoma. This appears similar to the ultrasound of 2013. The endometrium is difficult to identified on the background of the multiple leiomyoma but measures 8 mm. Additionally, the ovaries are not identified. There is reported history of a LEFT oophorectomy in 2008. No free fluid. IMPRESSION: 1. Leiomyomatous uterus similar to 2013. 2. No decidual reaction or gestational sac identified. 3. No intrauterine gestational sac, yolk sac, or fetal pole identified. Differential considerations include intrauterine pregnancy too early to be sonographically visualized, missed abortion, or ectopic pregnancy. Followup ultrasound is recommended in 10-14 days for further evaluation. Electronically Signed   By: Suzy Bouchard M.D.   On: 03/27/2015 18:07   US Ob Transvaginal  03/27/2015  CLINICAL DATA:  Low beta HCG of 27. Abdominal pain with sepsis. Elevated white blood cell count. EXAM: OBSTETRIC <14 WK [place] AND TRANSVAGINAL OB US TECHNIQUE: Both transabdominal and transvaginal ultrasound examinations were performed for complete evaluation of the gestation as well as the maternal uterus, adnexal regions, and pelvic cul-de-sac. Transvaginal technique was performed to assess early pregnancy. COMPARISON:  Ultrasound 02/13/2012 FINDINGS: Intrauterine gestational sac: None Yolk sac:  Not present Embryo:  Not present Maternal uterus/adnexae: The uterus is lobular and heterogeneous echotexture consistent multiple leiomyoma. This appears similar to the ultrasound of 2013. The endometrium is difficult to identified on the background of the multiple leiomyoma but measures 8 mm. Additionally, the ovaries are not identified. There is reported history of a LEFT oophorectomy in 2008. No free fluid. IMPRESSION: 1. Leiomyomatous uterus similar to 2013. 2. No decidual reaction or gestational sac identified. 3. No intrauterine gestational sac, yolk sac, or fetal pole identified. Differential  considerations include intrauterine pregnancy too early to be sonographically visualized, missed abortion, or ectopic pregnancy. Followup ultrasound is recommended in 10-14 days for further evaluation. Electronically Signed   By: Suzy Bouchard M.D.   On: 03/27/2015 18:07   Dg Chest Port 1 View  03/27/2015  CLINICAL DATA:  Fever for 3 days EXAM: PORTABLE CHEST 1 VIEW COMPARISON:  None. FINDINGS: Lungs are clear. Heart is upper normal in size with pulmonary vascularity within normal limits. No adenopathy. No bone lesions. IMPRESSION: No edema or consolidation. Electronically Signed   By: Lowella Grip III M.D.   On: 03/27/2015 15:43    I have reviewed the patient's current medications.  Assessment/Plan: 1 AKI improving , mild acid excretion defect.   Cr at baseline 2 G_pos sepsis per ID. 3 DM 4 Arthritis ?? Septic need to eval joints. P will not follow formally at this point. But follow socially.      LOS: 2 days   [name] L 03/29/2015,9:03 AM

## 2015-03-29 NOTE — Progress Notes (Addendum)
[facility] PROGRESS NOTE  [name] VN:[id number] DOB: 1965/05/10 DOA: 03/27/2015 PCP: [name], MD  Assessment/Plan: 1. GAS Bacteremia -on IV Daptomycin, change to IV ?Ceftriaxone, will defer to ID -had some sore throat prior to admission and son had strep throat 2weeks ago. -ID and Cardiology following -I am concerned abt potentially Rheumatic fever given GAS, migratory polyarthritis, ? Carditis( having NSVT) vs Endocarditis with disseminated infection/multiple joint seeding -await ID input today regarding need for Arthrocentesis, i would err towards r/o septic arthritis, will consult Ortho if this is appropriate pending ID eval today -2D ECHO today, plan for possible TEE tomorrow per Cards, d/w Dr.Ross -now off IVF, supportive care  2. AKI  -due to sepsis, poor Po intake, NSAIDs, ARB -Baseline creatinine thought to be within normal limits, on admit creatinine  5.26  -Nephrology consulted. Ultrasounds of abdomen and pelvis showed signs of medical renal disease and no signs of obstruction. -creatinine normalized, Renal following, IVF stopped  3. DM -metformin on hold, SSI, FU hbaic  4. NSVT -none today, had an episode of 30beats last pm -continue metoprolol, replace K and Mag -2d ECHO today and TEE possibly  -Cards following  5. R shoulder, elbow, wrist pain Polyarthritis -R shoulder more swelling today, may need Arthrocentesis to r/o Septic arthritis, d/w  Orthopedics Dr.TIm Percell Miller who recommended IR to attempt aspiration under Fluoro if possible, Also d/w Dr.[name] -unable to use NSAIDs and hesitant to use steroids until infection r/o  6. Elevated Bili -could be due to sepsis, ALP, AST/ALT WNL  7. HTN -stable, holding ARB due to AKI  8. Elevated B-HCG, she is not pregnant, LMP>1year ago, menopausal now, Pelvic US negative for gestational sac -suspect this could be from her pituitary gland, needs to be followed, GYn  As outpatient  DVt proph: Hep  SQ  Code Status: Full Code Family Communication: husband, Dr.Foli Spring at bedside Disposition Plan: Pending improvement     HPI/Subjective: C/o pain in neck, shoulders, elbows etc, R shoulder worse than yesterday  Objective: Filed Vitals:   03/29/15 0558 03/29/15 1106  BP: 141/82 129/80  Pulse: 127 111  Temp: 99.7 F (37.6 C) 99 F (37.2 C)  Resp: 18 20    Intake/Output Summary (Last 24 hours) at 03/29/15 1140 Last data filed at 03/29/15 0600  Gross per 24 hour  Intake   2455 ml  Output   1800 ml  Net    655 ml   Filed Weights   03/27/15 1152 03/27/15 2028 03/28/15 2025  Weight: 78.926 kg (174 lb) 79.379 kg (175 lb) 80.05 kg (176 lb 7.7 oz)    Exam:   General:  AAOx3, ill appearing  Cardiovascular: S1S2/RRR  Respiratory: CTAB  Abdomen: soft, Mildly distended, BS present  Musculoskeletal: R shoulder with swelling warmth, tenderness, also tenderness of R elbow and wrist, L arm with swelling elbow, wrist too but much less pronounced  Data Reviewed: Basic Metabolic Panel:  Recent Labs Lab 03/27/15 1851 03/28/15 0015 03/28/15 0944 03/28/15 1125 03/29/15 0624  NA 135 137 138 154* 134*  K 4.0 3.7 3.9 4.3 3.4*  CL 102 104 105 114* 106  CO2 20* 20* 20* 19* 20*  GLUCOSE 143* 141* 135* 139* 198*  BUN 53* 41* 31* 28* 19  CREATININE 3.45* 2.52* 1.72* 1.77* 1.08*  CALCIUM 7.1* 7.1* 7.7* 7.7* 8.2*  MG  --   --   --  1.7  --   PHOS  --  3.6 2.9  --   --  Liver Function Tests:  Recent Labs Lab 03/27/15 1152 03/28/15 0015 03/28/15 0944 03/28/15 1125 03/29/15 0624  AST 41  --   --  38 32  ALT 31  --   --  27 27  ALKPHOS 115  --   --  107 137*  BILITOT 2.1*  --   --  3.1* 4.0*  PROT 7.2  --   --  6.3* 6.5  ALBUMIN 3.2* 2.2* 2.4* 2.3* 2.2*    Recent Labs Lab 03/27/15 1152  LIPASE 22   No results for input(s): AMMONIA in the last 168 hours. CBC:  Recent Labs Lab 03/27/15 1152 03/28/15 0943 03/29/15 0624  WBC 21.9* 18.2* 17.0*   NEUTROABS 21.1*  --   --   HGB 11.4* 9.2* 9.3*  HCT 33.6* 27.9* 27.9*  MCV 82.0 82.1 81.3  PLT 176 157 168   Cardiac Enzymes:  Recent Labs Lab 03/28/15 0943  CKTOTAL 179   BNP (last 3 results) No results for input(s): BNP in the last 8760 hours.  ProBNP (last 3 results) No results for input(s): PROBNP in the last 8760 hours.  CBG:  Recent Labs Lab 03/28/15 0759 03/28/15 1156 03/28/15 1643 03/28/15 2129 03/28/15 2228  GLUCAP 132* 114* 86 70 148*    Recent Results (from the past 240 hour(s))  Urine culture     Status: None (Preliminary result)   Collection Time: 03/27/15 12:42 PM  Result Value Ref Range Status   Specimen Description URINE, CLEAN CATCH  Final   Special Requests Normal  Final   Culture TOO YOUNG TO READ  Final   Report Status PENDING  Incomplete  Culture, blood (Routine X 2) w Reflex to ID Panel     Status: None (Preliminary result)   Collection Time: 03/27/15  2:25 PM  Result Value Ref Range Status   Specimen Description BLOOD LEFT ANTECUBITAL  Final   Special Requests BOTTLES DRAWN AEROBIC ONLY 5CC  Final   Culture  Setup Time   Final    GRAM POSITIVE COCCI IN PAIRS IN CHAINS AEROBIC BOTTLE ONLY CRITICAL RESULT CALLED TO, READ BACK BY AND VERIFIED WITH: A. MINTZ,RN AT 0738 ON [id number] BY Rhea Bleacher    Culture GROUP A STREP (S.PYOGENES) ISOLATED  Final   Report Status PENDING  Incomplete  Culture, blood (Routine X 2) w Reflex to ID Panel     Status: None (Preliminary result)   Collection Time: 03/27/15  3:00 PM  Result Value Ref Range Status   Specimen Description BLOOD RIGHT HAND  Final   Special Requests BOTTLES DRAWN AEROBIC AND ANAEROBIC 5CC  Final   Culture  Setup Time   Final    GRAM POSITIVE COCCI IN PAIRS IN CHAINS IN BOTH AEROBIC AND ANAEROBIC BOTTLES CRITICAL RESULT CALLED TO, READ BACK BY AND VERIFIED WITH: A. MINTZ,RN AT 0848 ON [id number] BY Rhea Bleacher    Culture   Final    GROUP A STREP (S.PYOGENES) ISOLATED SUSCEPTIBILITIES  TO FOLLOW    Report Status PENDING  Incomplete  Culture, blood (routine x 2)     Status: None (Preliminary result)   Collection Time: 03/28/15  3:00 PM  Result Value Ref Range Status   Specimen Description BLOOD RIGHT HAND  Final   Special Requests BOTTLES DRAWN AEROBIC AND ANAEROBIC 5CC  Final   Culture NO GROWTH < 24 HOURS  Final   Report Status PENDING  Incomplete  Culture, blood (routine x 2)     Status: None (Preliminary result)  Collection Time: 03/28/15  3:05 PM  Result Value Ref Range Status   Specimen Description BLOOD RIGHT HAND  Final   Special Requests BOTTLES DRAWN AEROBIC ONLY 8.5CC  Final   Culture NO GROWTH < 24 HOURS  Final   Report Status PENDING  Incomplete     Studies: US Soft Tissue Head/neck  03/27/2015  CLINICAL DATA:  Left-sided neck pain EXAM: ULTRASOUND LEFT NECK TECHNIQUE: Longitudinal and transverse images of the left neck region in the area of pain performed COMPARISON:  None. FINDINGS: Sonographic images of the left neck show no mass or inflammatory focus. No abnormal fluid collection. In particular, no abscess. Several tiny lymph nodes are noted in this area; by size criteria, there is no adenopathy in this area. Vascular structures of this area are widely patent. IMPRESSION: Scattered subcentimeter lymph nodes in the left neck, not meeting size criteria for pathologic significance. No mass or inflammatory focus appreciable. No abscess in particular. Electronically Signed   By: Lowella Grip III M.D.   On: 03/27/2015 17:19   US Abdomen Complete  03/27/2015  CLINICAL DATA:  Septic, generalized upper and lower abdominal pain, positive pregnancy test EXAM: ABDOMEN ULTRASOUND COMPLETE COMPARISON:  None FINDINGS: Gallbladder: Dependent echogenic material with minimal shadowing likely calculi in gallbladder. No gallbladder wall thickening, pericholecystic fluid, or sonographic Murphy sign. Common bile duct: Diameter: Normal caliber 4 mm diameter Liver: Normal  appearance IVC: Normal appearance Pancreas: Distal tail obscured by bowel gas. Visualized portion normal appearance. Spleen: Normal appearance, 8.5 cm length Right Kidney: Length: 12.5 cm. Increased cortical echogenicity. Normal cortical thickness. No mass or hydronephrosis. Left Kidney: Length: 12.7 cm. Increased cortical echogenicity. Normal cortical thickness. No mass or hydronephrosis. Abdominal aorta: Normal caliber Other findings: No free fluid IMPRESSION: Probable medical renal disease changes. Probable cholelithiasis without evidence acute cholecystitis. Remainder of exam unremarkable. Electronically Signed   By: Lavonia Dana M.D.   On: 03/27/2015 17:29   US Ob Comp Less 14 Wks  03/27/2015  CLINICAL DATA:  Low beta HCG of 27. Abdominal pain with sepsis. Elevated white blood cell count. EXAM: OBSTETRIC <14 WK [place] AND TRANSVAGINAL OB US TECHNIQUE: Both transabdominal and transvaginal ultrasound examinations were performed for complete evaluation of the gestation as well as the maternal uterus, adnexal regions, and pelvic cul-de-sac. Transvaginal technique was performed to assess early pregnancy. COMPARISON:  Ultrasound 02/13/2012 FINDINGS: Intrauterine gestational sac: None Yolk sac:  Not present Embryo:  Not present Maternal uterus/adnexae: The uterus is lobular and heterogeneous echotexture consistent multiple leiomyoma. This appears similar to the ultrasound of 2013. The endometrium is difficult to identified on the background of the multiple leiomyoma but measures 8 mm. Additionally, the ovaries are not identified. There is reported history of a LEFT oophorectomy in 2008. No free fluid. IMPRESSION: 1. Leiomyomatous uterus similar to 2013. 2. No decidual reaction or gestational sac identified. 3. No intrauterine gestational sac, yolk sac, or fetal pole identified. Differential considerations include intrauterine pregnancy too early to be sonographically visualized, missed abortion, or ectopic pregnancy.  Followup ultrasound is recommended in 10-14 days for further evaluation. Electronically Signed   By: Suzy Bouchard M.D.   On: 03/27/2015 18:07   US Ob Transvaginal  03/27/2015  CLINICAL DATA:  Low beta HCG of 27. Abdominal pain with sepsis. Elevated white blood cell count. EXAM: OBSTETRIC <14 WK [place] AND TRANSVAGINAL OB US TECHNIQUE: Both transabdominal and transvaginal ultrasound examinations were performed for complete evaluation of the gestation as well as the maternal uterus, adnexal regions,  and pelvic cul-de-sac. Transvaginal technique was performed to assess early pregnancy. COMPARISON:  Ultrasound 02/13/2012 FINDINGS: Intrauterine gestational sac: None Yolk sac:  Not present Embryo:  Not present Maternal uterus/adnexae: The uterus is lobular and heterogeneous echotexture consistent multiple leiomyoma. This appears similar to the ultrasound of 2013. The endometrium is difficult to identified on the background of the multiple leiomyoma but measures 8 mm. Additionally, the ovaries are not identified. There is reported history of a LEFT oophorectomy in 2008. No free fluid. IMPRESSION: 1. Leiomyomatous uterus similar to 2013. 2. No decidual reaction or gestational sac identified. 3. No intrauterine gestational sac, yolk sac, or fetal pole identified. Differential considerations include intrauterine pregnancy too early to be sonographically visualized, missed abortion, or ectopic pregnancy. Followup ultrasound is recommended in 10-14 days for further evaluation. Electronically Signed   By: Suzy Bouchard M.D.   On: 03/27/2015 18:07   Dg Chest Port 1 View  03/27/2015  CLINICAL DATA:  Fever for 3 days EXAM: PORTABLE CHEST 1 VIEW COMPARISON:  None. FINDINGS: Lungs are clear. Heart is upper normal in size with pulmonary vascularity within normal limits. No adenopathy. No bone lesions. IMPRESSION: No edema or consolidation. Electronically Signed   By: Lowella Grip III M.D.   On: 03/27/2015 15:43     Scheduled Meds: . DAPTOmycin (CUBICIN)  IV  480 mg Intravenous Q24H  . heparin  5,000 Units Subcutaneous 3 times per day  . insulin aspart  0-9 Units Subcutaneous TID WC  . insulin aspart  3 Units Subcutaneous TID WC  . metoprolol tartrate  12.5 mg Oral BID   Continuous Infusions:   Antibiotics Given (last 72 hours)    Date/Time Action Medication Dose Rate   03/27/15 1853 Given   DAPTOmycin (CUBICIN) 473.5 mg in sodium chloride 0.9 % IVPB 473.5 mg 218.9 mL/hr      Principal Problem:   Streptococcal bacteremia Active Problems:   Acute renal failure (ARF) (HCC)   Abdominal pain, vomiting, and diarrhea   Posterior cervical adenopathy   Leukocytosis   Diabetes mellitus ([place])   Hypertension   Polyarthralgia   NSVT (nonsustained ventricular tachycardia) ([place])   Sinus tachycardia ([place])    Time spent: 28min    Suzanne Kho  [facility] Pager [phone]. If 7PM-7AM, please contact night-coverage at [email or web address], password [facility] 03/29/2015, 11:40 AM  LOS: 2 days

## 2015-03-30 ENCOUNTER — Inpatient Hospital Stay (HOSPITAL_COMMUNITY): Payer: Managed Care, Other (non HMO) | Admitting: Certified Registered Nurse Anesthetist

## 2015-03-30 ENCOUNTER — Encounter (HOSPITAL_COMMUNITY)
Admission: EM | Disposition: A | Discharge status: Home Health | Payer: Self-pay | Source: Home / Self Care | Attending: Internal Medicine

## 2015-03-30 ENCOUNTER — Inpatient Hospital Stay (HOSPITAL_COMMUNITY): Payer: Managed Care, Other (non HMO)

## 2015-03-30 ENCOUNTER — Encounter (HOSPITAL_COMMUNITY): Payer: Self-pay | Admitting: Certified Registered"

## 2015-03-30 DIAGNOSIS — M00211 Other streptococcal arthritis, right shoulder: Secondary | ICD-10-CM

## 2015-03-30 DIAGNOSIS — E8809 Other disorders of plasma-protein metabolism, not elsewhere classified: Secondary | ICD-10-CM

## 2015-03-30 DIAGNOSIS — M1990 Unspecified osteoarthritis, unspecified site: Secondary | ICD-10-CM | POA: Insufficient documentation

## 2015-03-30 DIAGNOSIS — R740 Nonspecific elevation of levels of transaminase and lactic acid dehydrogenase [LDH]: Secondary | ICD-10-CM

## 2015-03-30 HISTORY — PX: SHOULDER ARTHROSCOPY: SHX128

## 2015-03-30 HISTORY — PX: I&D EXTREMITY: SHX5045

## 2015-03-30 LAB — GRAM STAIN

## 2015-03-30 LAB — COMPREHENSIVE METABOLIC PANEL
ALBUMIN: 1.8 g/dL — AB (ref 3.5–5.0)
ALT: 24 U/L (ref 14–54)
AST: 52 U/L — AB (ref 15–41)
Alkaline Phosphatase: 161 U/L — ABNORMAL HIGH (ref 38–126)
Anion gap: 10 (ref 5–15)
BILIRUBIN TOTAL: 2.1 mg/dL — AB (ref 0.3–1.2)
BUN: 15 mg/dL (ref 6–20)
CO2: 21 mmol/L — ABNORMAL LOW (ref 22–32)
CREATININE: 0.84 mg/dL (ref 0.44–1.00)
Calcium: 8.6 mg/dL — ABNORMAL LOW (ref 8.9–10.3)
Chloride: 100 mmol/L — ABNORMAL LOW (ref 101–111)
GFR calc Af Amer: >60 mL/min (ref >60)
GLUCOSE: 179 mg/dL — AB (ref 65–99)
POTASSIUM: 3.8 mmol/L (ref 3.5–5.1)
Sodium: 131 mmol/L — ABNORMAL LOW (ref 135–145)
TOTAL PROTEIN: 6.2 g/dL — AB (ref 6.5–8.1)

## 2015-03-30 LAB — GLUCOSE, CAPILLARY
GLUCOSE-CAPILLARY: 196 mg/dL — AB (ref 65–99)
GLUCOSE-CAPILLARY: 203 mg/dL — AB (ref 65–99)
GLUCOSE-CAPILLARY: 126 mg/dL — AB (ref 65–99)
GLUCOSE-CAPILLARY: 125 mg/dL — AB (ref 65–99)
GLUCOSE-CAPILLARY: 160 mg/dL — AB (ref 65–99)
Glucose-Capillary: 146 mg/dL — ABNORMAL HIGH (ref 65–99)

## 2015-03-30 LAB — CULTURE, BLOOD (ROUTINE X 2)

## 2015-03-30 LAB — SYNOVIAL CELL COUNT + DIFF, W/ CRYSTALS
Crystals, Fluid: NONE SEEN
Eosinophils-Synovial: 0 % (ref 0–1)
LYMPHOCYTES-SYNOVIAL FLD: 0 % (ref 0–20)
MONOCYTE-MACROPHAGE-SYNOVIAL FLUID: 5 % — AB (ref 50–90)
NEUTROPHIL, SYNOVIAL: 95 % — AB (ref 0–25)
WBC, Synovial: 79750 /mm3 — ABNORMAL HIGH (ref 0–200)

## 2015-03-30 LAB — HIV ANTIBODY (ROUTINE TESTING W REFLEX): HIV Screen 4th Generation wRfx: NONREACTIVE

## 2015-03-30 LAB — CBC
HEMATOCRIT: 24.4 % — AB (ref 36.0–46.0)
Hemoglobin: 8.2 g/dL — ABNORMAL LOW (ref 12.0–15.0)
MCH: 27.3 pg (ref 26.0–34.0)
MCHC: 33.6 g/dL (ref 30.0–36.0)
MCV: 81.3 fL (ref 78.0–100.0)
PLATELETS: 193 10*3/uL (ref 150–400)
RBC: 3 MIL/uL — ABNORMAL LOW (ref 3.87–5.11)
RDW: 14.3 % (ref 11.5–15.5)
WBC: 22.5 10*3/uL — ABNORMAL HIGH (ref 4.0–10.5)

## 2015-03-30 SURGERY — ECHOCARDIOGRAM, TRANSESOPHAGEAL
Anesthesia: Moderate Sedation

## 2015-03-30 SURGERY — IRRIGATION AND DEBRIDEMENT EXTREMITY
Anesthesia: General | Site: Shoulder | Laterality: Right

## 2015-03-30 MED ORDER — METOCLOPRAMIDE HCL 5 MG PO TABS
5.0000 mg | ORAL_TABLET | Freq: Three times a day (TID) | ORAL | Status: DC | PRN
  Filled 2015-03-30: qty 2

## 2015-03-30 MED ORDER — IOHEXOL 180 MG/ML  SOLN
20.0000 mL | Freq: Once | INTRAMUSCULAR | Status: AC | PRN
  Administered 2015-03-30: 6 mL via INTRA_ARTICULAR

## 2015-03-30 MED ORDER — BUPIVACAINE-EPINEPHRINE (PF) 0.5% -1:200000 IJ SOLN
INTRAMUSCULAR | Status: DC | PRN
  Administered 2015-03-30: 25 mL via PERINEURAL

## 2015-03-30 MED ORDER — MEPERIDINE HCL 25 MG/ML IJ SOLN: 6.2500 mg | INTRAMUSCULAR | Status: DC | PRN

## 2015-03-30 MED ORDER — PROMETHAZINE HCL 25 MG/ML IJ SOLN
6.2500 mg | INTRAMUSCULAR | Status: DC | PRN
  Administered 2015-03-30: 6.25 mg via INTRAVENOUS

## 2015-03-30 MED ORDER — CEFAZOLIN SODIUM-DEXTROSE 2-3 GM-% IV SOLR
2.0000 g | Freq: Four times a day (QID) | INTRAVENOUS | Status: AC
  Administered 2015-03-31 (×3): 2 g via INTRAVENOUS
  Filled 2015-03-30 (×4): qty 50

## 2015-03-30 MED ORDER — ONDANSETRON HCL 4 MG/2ML IJ SOLN: 4.0000 mg | Freq: Four times a day (QID) | INTRAMUSCULAR | Status: DC | PRN

## 2015-03-30 MED ORDER — SUCCINYLCHOLINE CHLORIDE 20 MG/ML IJ SOLN
INTRAMUSCULAR | Status: DC | PRN
  Administered 2015-03-30: 120 mg via INTRAVENOUS

## 2015-03-30 MED ORDER — ONDANSETRON HCL 4 MG/2ML IJ SOLN
INTRAMUSCULAR | Status: DC | PRN
  Administered 2015-03-30: 4 mg via INTRAVENOUS

## 2015-03-30 MED ORDER — CEFAZOLIN SODIUM-DEXTROSE 2-3 GM-% IV SOLR
2.0000 g | Freq: Once | INTRAVENOUS | Status: AC
  Administered 2015-03-30: 2 g via INTRAVENOUS

## 2015-03-30 MED ORDER — PHENYLEPHRINE HCL 10 MG/ML IJ SOLN
INTRAMUSCULAR | Status: AC
  Filled 2015-03-30: qty 1

## 2015-03-30 MED ORDER — FENTANYL CITRATE (PF) 100 MCG/2ML IJ SOLN
100.0000 ug | Freq: Once | INTRAMUSCULAR | Status: AC
  Administered 2015-03-30: 100 ug via INTRAVENOUS

## 2015-03-30 MED ORDER — SODIUM CHLORIDE 0.9 % IJ SOLN
INTRAMUSCULAR | Status: AC
  Filled 2015-03-30: qty 20

## 2015-03-30 MED ORDER — PROPOFOL 10 MG/ML IV BOLUS
INTRAVENOUS | Status: AC
  Filled 2015-03-30: qty 20

## 2015-03-30 MED ORDER — MIDAZOLAM HCL 2 MG/2ML IJ SOLN
INTRAMUSCULAR | Status: AC
  Administered 2015-03-30: 2 mg
  Filled 2015-03-30: qty 2

## 2015-03-30 MED ORDER — DOCUSATE SODIUM 100 MG PO CAPS
100.0000 mg | ORAL_CAPSULE | Freq: Two times a day (BID) | ORAL | Status: DC
  Administered 2015-03-30 – 2015-04-04 (×8): 100 mg via ORAL
  Filled 2015-03-30 (×15): qty 1

## 2015-03-30 MED ORDER — PHENYLEPHRINE HCL 10 MG/ML IJ SOLN
10.0000 mg | INTRAVENOUS | Status: DC | PRN
  Administered 2015-03-30: 50 ug/min via INTRAVENOUS

## 2015-03-30 MED ORDER — ACETAMINOPHEN 650 MG RE SUPP: 650.0000 mg | Freq: Four times a day (QID) | RECTAL | Status: DC | PRN

## 2015-03-30 MED ORDER — METOPROLOL TARTRATE 25 MG PO TABS
25.0000 mg | ORAL_TABLET | Freq: Two times a day (BID) | ORAL | Status: DC
  Administered 2015-03-30 – 2015-03-31 (×3): 25 mg via ORAL
  Filled 2015-03-30 (×3): qty 1

## 2015-03-30 MED ORDER — ALBUTEROL SULFATE (2.5 MG/3ML) 0.083% IN NEBU
INHALATION_SOLUTION | RESPIRATORY_TRACT | Status: AC
Start: 2015-03-30 — End: 2015-03-30
  Filled 2015-03-30: qty 3

## 2015-03-30 MED ORDER — ONDANSETRON HCL 4 MG/2ML IJ SOLN
4.0000 mg | Freq: Once | INTRAMUSCULAR | Status: AC | PRN
  Administered 2015-03-30: 4 mg via INTRAVENOUS

## 2015-03-30 MED ORDER — MIDAZOLAM HCL 2 MG/2ML IJ SOLN
INTRAMUSCULAR | Status: AC
  Filled 2015-03-30: qty 2

## 2015-03-30 MED ORDER — FENTANYL CITRATE (PF) 100 MCG/2ML IJ SOLN
INTRAMUSCULAR | Status: AC
  Filled 2015-03-30: qty 2

## 2015-03-30 MED ORDER — POTASSIUM CHLORIDE IN NACL 20-0.45 MEQ/L-% IV SOLN
INTRAVENOUS | Status: DC
  Administered 2015-03-30: 23:00:00 via INTRAVENOUS
  Filled 2015-03-30 (×2): qty 1000

## 2015-03-30 MED ORDER — FENTANYL CITRATE (PF) 250 MCG/5ML IJ SOLN
INTRAMUSCULAR | Status: AC
  Filled 2015-03-30: qty 5

## 2015-03-30 MED ORDER — ONDANSETRON HCL 4 MG PO TABS: 4.0000 mg | ORAL_TABLET | Freq: Four times a day (QID) | ORAL | Status: DC | PRN

## 2015-03-30 MED ORDER — ALBUTEROL SULFATE (2.5 MG/3ML) 0.083% IN NEBU
2.5000 mg | INHALATION_SOLUTION | Freq: Four times a day (QID) | RESPIRATORY_TRACT | Status: DC | PRN
  Administered 2015-03-30: 2.5 mg via RESPIRATORY_TRACT

## 2015-03-30 MED ORDER — PHENYLEPHRINE HCL 10 MG/ML IJ SOLN
INTRAMUSCULAR | Status: DC | PRN
  Administered 2015-03-30: 120 ug via INTRAVENOUS
  Administered 2015-03-30: 280 ug via INTRAVENOUS

## 2015-03-30 MED ORDER — ACETAMINOPHEN 325 MG PO TABS
650.0000 mg | ORAL_TABLET | Freq: Four times a day (QID) | ORAL | Status: DC | PRN
  Administered 2015-04-02 – 2015-04-04 (×2): 650 mg via ORAL
  Filled 2015-03-30 (×2): qty 2

## 2015-03-30 MED ORDER — METOCLOPRAMIDE HCL 5 MG/ML IJ SOLN: 5.0000 mg | Freq: Three times a day (TID) | INTRAMUSCULAR | Status: DC | PRN

## 2015-03-30 MED ORDER — CEFAZOLIN SODIUM-DEXTROSE 2-3 GM-% IV SOLR
INTRAVENOUS | Status: AC
  Filled 2015-03-30: qty 50

## 2015-03-30 MED ORDER — HYDROMORPHONE HCL 1 MG/ML IJ SOLN: 0.2500 mg | INTRAMUSCULAR | Status: DC | PRN

## 2015-03-30 MED ORDER — ONDANSETRON HCL 4 MG/2ML IJ SOLN
INTRAMUSCULAR | Status: AC
  Filled 2015-03-30: qty 2

## 2015-03-30 MED ORDER — MIDAZOLAM HCL 5 MG/ML IJ SOLN: 2.0000 mg | Freq: Once | INTRAMUSCULAR | Status: DC

## 2015-03-30 MED ORDER — LIDOCAINE HCL (CARDIAC) 20 MG/ML IV SOLN
INTRAVENOUS | Status: DC | PRN
  Administered 2015-03-30: 60 mg via INTRAVENOUS

## 2015-03-30 MED ORDER — LIDOCAINE HCL (PF) 1 % IJ SOLN
INTRAMUSCULAR | Status: AC
  Administered 2015-03-30: 5 mL via INTRAMUSCULAR
  Filled 2015-03-30: qty 10

## 2015-03-30 MED ORDER — SODIUM CHLORIDE 0.9 % IR SOLN
Status: DC | PRN
  Administered 2015-03-30 (×3): 3000 mL

## 2015-03-30 MED ORDER — PROMETHAZINE HCL 25 MG/ML IJ SOLN
INTRAMUSCULAR | Status: AC
  Filled 2015-03-30: qty 1

## 2015-03-30 MED ORDER — PROPOFOL 10 MG/ML IV BOLUS
INTRAVENOUS | Status: DC | PRN
  Administered 2015-03-30: 180 mg via INTRAVENOUS

## 2015-03-30 MED ORDER — LACTATED RINGERS IV SOLN
INTRAVENOUS | Status: DC
  Administered 2015-03-30: 16:00:00 via INTRAVENOUS

## 2015-03-30 SURGICAL SUPPLY — 47 items
BANDAGE ELASTIC 4 VELCRO ST LF (GAUZE/BANDAGES/DRESSINGS) ×3 IMPLANT
BANDAGE ELASTIC 6 VELCRO ST LF (GAUZE/BANDAGES/DRESSINGS) ×3 IMPLANT
BLADE GREAT WHITE 4.2 (BLADE) ×2 IMPLANT
BLADE GREAT WHITE 4.2MM (BLADE) ×1
BLADE SURG 10 STRL SS (BLADE) ×3 IMPLANT
BNDG COHESIVE 4X5 TAN STRL (GAUZE/BANDAGES/DRESSINGS) ×3 IMPLANT
BNDG GAUZE ELAST 4 BULKY (GAUZE/BANDAGES/DRESSINGS) ×3 IMPLANT
CONT SPEC 4OZ CLIKSEAL STRL BL (MISCELLANEOUS) ×3 IMPLANT
COVER SURGICAL LIGHT HANDLE (MISCELLANEOUS) ×3 IMPLANT
CUFF TOURNIQUET SINGLE 34IN LL (TOURNIQUET CUFF) IMPLANT
DRSG ADAPTIC 3X8 NADH LF (GAUZE/BANDAGES/DRESSINGS) ×3 IMPLANT
DRSG PAD ABDOMINAL 8X10 ST (GAUZE/BANDAGES/DRESSINGS) ×3 IMPLANT
DURAPREP 26ML APPLICATOR (WOUND CARE) ×3 IMPLANT
ELECT REM PT RETURN 9FT ADLT (ELECTROSURGICAL)
ELECTRODE REM PT RTRN 9FT ADLT (ELECTROSURGICAL) IMPLANT
EVACUATOR 1/8 PVC DRAIN (DRAIN) IMPLANT
FACESHIELD WRAPAROUND (MASK) ×9 IMPLANT
GAUZE SPONGE 4X4 12PLY STRL (GAUZE/BANDAGES/DRESSINGS) ×3 IMPLANT
GAUZE XEROFORM 1X8 LF (GAUZE/BANDAGES/DRESSINGS) ×3 IMPLANT
GLOVE BIO SURGEON STRL SZ7 (GLOVE) ×3 IMPLANT
GLOVE BIO SURGEON STRL SZ7.5 (GLOVE) ×3 IMPLANT
GLOVE BIOGEL PI IND STRL 7.0 (GLOVE) ×1 IMPLANT
GLOVE BIOGEL PI INDICATOR 7.0 (GLOVE) ×2
GOWN STRL REUS W/ TWL LRG LVL3 (GOWN DISPOSABLE) ×2 IMPLANT
GOWN STRL REUS W/TWL LRG LVL3 (GOWN DISPOSABLE) ×4
HANDPIECE INTERPULSE COAX TIP (DISPOSABLE)
IV NS IRRIG 3000ML ARTHROMATIC (IV SOLUTION) ×3 IMPLANT
KIT BASIN OR (CUSTOM PROCEDURE TRAY) ×3 IMPLANT
KIT ROOM TURNOVER OR (KITS) ×3 IMPLANT
MANIFOLD NEPTUNE II (INSTRUMENTS) ×3 IMPLANT
NS IRRIG 1000ML POUR BTL (IV SOLUTION) IMPLANT
PACK ORTHO EXTREMITY (CUSTOM PROCEDURE TRAY) ×3 IMPLANT
PAD ARMBOARD 7.5X6 YLW CONV (MISCELLANEOUS) ×6 IMPLANT
PENCIL BUTTON HOLSTER BLD 10FT (ELECTRODE) IMPLANT
SET HNDPC FAN SPRY TIP SCT (DISPOSABLE) IMPLANT
SPONGE LAP 18X18 X RAY DECT (DISPOSABLE) ×3 IMPLANT
STOCKINETTE IMPERVIOUS 9X36 MD (GAUZE/BANDAGES/DRESSINGS) ×3 IMPLANT
SUT ETHILON 3 0 PS 1 (SUTURE) IMPLANT
TOWEL OR 17X24 6PK STRL BLUE (TOWEL DISPOSABLE) ×3 IMPLANT
TOWEL OR 17X26 10 PK STRL BLUE (TOWEL DISPOSABLE) ×3 IMPLANT
TOWEL OR NON WOVEN STRL DISP B (DISPOSABLE) ×3 IMPLANT
TUBE ANAEROBIC SPECIMEN COL (MISCELLANEOUS) IMPLANT
TUBE CONNECTING 12'X1/4 (SUCTIONS) ×1
TUBE CONNECTING 12X1/4 (SUCTIONS) ×2 IMPLANT
UNDERPAD 30X30 INCONTINENT (UNDERPADS AND DIAPERS) ×3 IMPLANT
WATER STERILE IRR 1000ML POUR (IV SOLUTION) ×3 IMPLANT
YANKAUER SUCT BULB TIP NO VENT (SUCTIONS) ×3 IMPLANT

## 2015-03-30 NOTE — Consult Note (Signed)
ORTHOPAEDIC CONSULTATION  REQUESTING PHYSICIAN: Domenic Polite, MD  Chief Complaint: Right shoulder pain  HPI: [name] is a 50 y.o. female who complains of right shoulder pain and swelling that has progressively worsened over the past 2 days.  Patient found to have GAS bacteremia during admission.  Medicine questioning rheumatic fever.  ID consulted.   TEE planned for tomorrow.  Patient had aspiration under fluoro done.  Cell count still pending.  Patient also complaining of R elbow and wrist pain.  Hx of DM and AKI due to sepsis.  Abx ordered.   Past Medical History  Diagnosis Date  . Hypertension   . Diabetes mellitus without complication [facility])    Past Surgical History  Procedure Laterality Date  . Breast surgery    . Uterine fibroid surgery    . Left oophorectomy Left    Social History   Social History  . Marital Status: Married    Spouse Name: N/A  . Number of Children: N/A  . Years of Education: N/A   Social History Main Topics  . Smoking status: Never Smoker   . Smokeless tobacco: None  . Alcohol Use: No  . Drug Use: No  . Sexual Activity: Not Asked   Other Topics Concern  . None   Social History Narrative   No family history on file. No Known Allergies Prior to Admission medications   Medication Sig Start Date End Date Taking? Authorizing Provider  acetaminophen (TYLENOL) 325 MG tablet Take 325 mg by mouth every 6 (six) hours as needed for mild pain or moderate pain.   Yes Historical Provider, MD  hydrochlorothiazide (HYDRODIURIL) 25 MG tablet Take 25 mg by mouth daily. 03/08/15  Yes Historical Provider, MD  losartan (COZAAR) 100 MG tablet Take 100 mg by mouth daily. 03/08/15  Yes Historical Provider, MD  metFORMIN (GLUCOPHAGE) 500 MG tablet Take 500 mg by mouth 2 (two) times daily. 03/08/15  Yes Historical Provider, MD  naproxen sodium (ANAPROX) 220 MG tablet Take 220 mg by mouth 2 (two) times daily as needed (pain).   Yes Historical Provider,  MD   Mr Shoulder Right Wo Contrast  03/30/2015  CLINICAL DATA:  Joint pain most severe in the right shoulder. Sepsis. EXAM: MRI OF THE RIGHT SHOULDER WITHOUT CONTRAST TECHNIQUE: Multiplanar, multisequence MR imaging of the shoulder was performed. No intravenous contrast was administered. COMPARISON:  03/27/2015 FINDINGS: Rotator cuff: Prominent partial thickness articular surface tearing of the supraspinatus, with the articular surface portion retracted 1.7 cm on image 16 series 5, compatible with tendon delamination. Moderate tendinopathy of the remaining supraspinatus tendon. Similarly there is partial thickness articular surface tearing of the infraspinatus tendon with moderate infraspinatus tendinopathy. Moderate subscapularis tendinopathy is present along with potential partial thickness articular surface tearing Muscles: Abnormal edema tracks primarily along the marginal portions of the supraspinatus, subscapularis, teres minor, and teres major muscles. There is also low-level edema along the superficial fascia margin of the lateral deltoid. Biceps long head: Partial tearing or advanced tendinopathy of the intra-articular segment. Acromioclavicular Joint: Mild degenerative AC joint arthropathy. Subacromial morphology is type 2 (curved). Small but abnormal amount of fluid in the subacromial subdeltoid bursa. Glenohumeral Joint: Prominent glenohumeral joint effusion. Moderate degenerative chondral thinning along the glenohumeral joint. Labrum: Linear irregularity in the superior labrum with a blunted appearance, compatible with SLAP tear extending into the biceps anchor. Bones: Unusual flaring of the proximal humeral metadiaphysis, query prior fracture. Small right axillary lymph nodes are observed. IMPRESSION: 1. Large  shoulder joint effusion with abnormal edema tracking within and along the margins of the regional musculature. In the setting of sepsis and new onset shoulder pain, there is a high degree of  concern for septic glenohumeral joint, and arthrocentesis is likely indicated. 2. Partial thickness articular surface tearing of the supraspinatus and infraspinatus with tendon delamination. There is potentially mild partial thickness articular surface tearing of the subscapularis. Considerable tendinopathy of these tendons as well. 3. Advanced tendinopathy or partial tearing of the long head of the biceps. 4. Increased signal in the superior labrum compatible with SLAP tear extending into the biceps anchor. 5. Subacromial subdeltoid bursitis. 6. Somewhat unusual flaring of the proximal humeral metadiaphysis, query prior fracture. 7. Moderate degenerative glenohumeral arthropathy. These results will be called to the ordering clinician or representative by the Radiologist Assistant, and communication documented in the PACS or zVision Dashboard. Electronically Signed   By: Van Clines M.D.   On: 03/30/2015 07:28   Dg Fluoro Guided Needle Plc Aspiration/injection Loc  03/30/2015  CLINICAL DATA:  Prior MRI suspicious for septic glenohumeral joint. Sepsis. EXAM: FLUORO GUIDED NEEDLE PLACEMENT FLUOROSCOPY TIME:  If the device does not provide the exposure index: Fluoroscopy Time (in minutes and seconds):  1 minutes and 42 seconds Number of Acquired Images:  None COMPARISON:  MRI of 03/29/2015. FINDINGS: Informed written and verbal consent were obtained. Risks and benefits of the procedure were discussed. A "Time out" was performed. The superior medial portion of the right humeral head was localized under fluoroscopic guidance. Skin was prepped and draped in standard sterile fashion. Skin was numbed with 1% lidocaine. A 20 gauge needle was inserted into the joint. No fluid could be aspirated. Intra-articular location was confirmed with approximately 6 cc of Omnipaque 180. Subsequent, approximately 5 cc of saline were injected. Only minimal fluid could be returned. IMPRESSION: Uncomplicated aspiration of the right  shoulder, as detailed above. Minimal fluid was obtained, and sent to laboratory as requested. Electronically Signed   By: Abigail Miyamoto M.D.   On: 03/30/2015 12:11    Positive ROS: All other systems have been reviewed and were otherwise negative with the exception of those mentioned in the HPI and as above.  Labs cbc  Recent Labs  03/29/15 0624 03/30/15 0715  WBC 17.0* 22.5*  HGB 9.3* 8.2*  HCT 27.9* 24.4*  PLT 168 193    Labs inflam No results for input(s): CRP in the last 72 hours.  Invalid input(s): ESR  Labs coag No results for input(s): INR, PTT in the last 72 hours.  Invalid input(s): PT   Recent Labs  03/29/15 0624 03/30/15 0715  NA 134* 131*  K 3.4* 3.8  CL 106 100*  CO2 20* 21*  GLUCOSE 198* 179*  BUN 19 15  CREATININE 1.08* 0.84  CALCIUM 8.2* 8.6*    Physical Exam: Filed Vitals:   03/30/15 0431 03/30/15 0937  BP: 146/86 146/89  Pulse: 111 121  Temp: 99.6 F (37.6 C) 99.3 F (37.4 C)  Resp: 16 18   General: Alert, no acute distress Cardiovascular: No pedal edema Respiratory: No cyanosis, no use of accessory musculature GI: No organomegaly, abdomen is soft and non-tender Skin: No lesions in the area of chief complaint other than those listed below in MSK exam.  Neurologic: Sensation intact distally Psychiatric: Patient is competent for consent with normal mood and affect Lymphatic: No axillary or cervical lymphadenopathy  MUSCULOSKELETAL:  R shoulder swelling with erythema and warmth.  Tender to palpation diffusely.  Limited ROM due to pain and swelling.  Sensation intact with 2+ distal pulses.  No swelling or warmth of right elbow or wrist but tender to palpation and pain with ROM.   Other extremities are atraumatic with painless ROM and NVI.  Assessment: R shoulder pain in the setting of sepsis with concern for possible rheumatic fever  Plan: R shoulder aspirated.  Cell count still pending.  Swelling and pain increasing.  High suspicion  for septic shoulder.  Recommending washout in the OR today.  Will have the patient be NPO.  Weight bearing as tolerated.  Will appreciate ID input on abx management once culture results received.    Gae Dry, PA-C Cell [phone]   03/30/2015 1:22 PM

## 2015-03-30 NOTE — Progress Notes (Signed)
[facility]   Reason for visit: Follow up on disseminated GAS bacteremia  Interval History: repeat blood cultures remain negative.  Shoulder aspirated and c/w septic arthritis with same organism since gram stain with GPC in pair and chains.  MRI noted and shoulder with large effusion.  WBC increased.  Now on penicillin.    MRI independently reviewed and effusion noted.   Physical Exam: Constitutional:  Filed Vitals:   03/30/15 0431 03/30/15 0937  BP: 146/86 146/89  Pulse: 111 121  Temp: 99.6 F (37.6 C) 99.3 F (37.4 C)  Resp: 16 18   patient appears in NAD Eyes: anicteric HENT: no thrush Respiratory: Normal respiratory effort; CTA B Cardiovascular: RRR GI: soft, nt, nd MS: right wrist with less edema, shoulder warm Skin no rashes  Review of Systems: Gastrointestinal: negative for diarrhea  Lab Results  Component Value Date   WBC 22.5* 03/30/2015   HGB 8.2* 03/30/2015   HCT 24.4* 03/30/2015   MCV 81.3 03/30/2015   PLT 193 03/30/2015    Lab Results  Component Value Date   CREATININE 0.84 03/30/2015   BUN 15 03/30/2015   NA 131* 03/30/2015   K 3.8 03/30/2015   CL 100* 03/30/2015   CO2 21* 03/30/2015    Lab Results  Component Value Date   ALT 24 03/30/2015   AST 52* 03/30/2015   ALKPHOS 161* 03/30/2015     Microbiology: Recent Results (from the past 240 hour(s))  Urine culture     Status: None   Collection Time: 03/27/15 12:42 PM  Result Value Ref Range Status   Specimen Description URINE, CLEAN CATCH  Final   Special Requests Normal  Final   Culture   Final    >=100,000 COLONIES/mL GROUP A STREP (S.PYOGENES) ISOLATED   Report Status 03/29/2015 FINAL  Final  Culture, blood (Routine X 2) w Reflex to ID Panel     Status: None   Collection Time: 03/27/15  2:25 PM  Result Value Ref Range Status   Specimen Description BLOOD LEFT ANTECUBITAL  Final   Special Requests BOTTLES DRAWN AEROBIC ONLY 5CC  Final   Culture  Setup Time    Final    GRAM POSITIVE COCCI IN PAIRS IN CHAINS AEROBIC BOTTLE ONLY CRITICAL RESULT CALLED TO, READ BACK BY AND VERIFIED WITH: A. MINTZ,RN AT 0738 ON [id number] BY Rhea Bleacher    Culture   Final    GROUP A STREP (S.PYOGENES) ISOLATED SUSCEPTIBILITIES PERFORMED ON PREVIOUS CULTURE WITHIN THE LAST 5 DAYS.    Report Status 03/30/2015 FINAL  Final  Culture, blood (Routine X 2) w Reflex to ID Panel     Status: None   Collection Time: 03/27/15  3:00 PM  Result Value Ref Range Status   Specimen Description BLOOD RIGHT HAND  Final   Special Requests BOTTLES DRAWN AEROBIC AND ANAEROBIC 5CC  Final   Culture  Setup Time   Final    GRAM POSITIVE COCCI IN PAIRS IN CHAINS IN BOTH AEROBIC AND ANAEROBIC BOTTLES CRITICAL RESULT CALLED TO, READ BACK BY AND VERIFIED WITH: A. MINTZ,RN AT 0848 ON [id number] BY Rhea Bleacher    Culture GROUP A STREP (S.PYOGENES) ISOLATED  Final   Report Status 03/30/2015 FINAL  Final   Organism ID, Bacteria GROUP A STREP (S.PYOGENES) ISOLATED  Final      Susceptibility   Group a strep (s.pyogenes) isolated - MIC*    CLINDAMYCIN >=1 RESISTANT Resistant     AMPICILLIN <=0.25 SENSITIVE Sensitive  ERYTHROMYCIN >=8 RESISTANT Resistant     VANCOMYCIN <=0.12 SENSITIVE Sensitive     CEFTRIAXONE <=0.12 SENSITIVE Sensitive     LEVOFLOXACIN <=0.25 SENSITIVE Sensitive     * GROUP A STREP (S.PYOGENES) ISOLATED  Culture, blood (routine x 2)     Status: None (Preliminary result)   Collection Time: 03/28/15  3:00 PM  Result Value Ref Range Status   Specimen Description BLOOD RIGHT HAND  Final   Special Requests BOTTLES DRAWN AEROBIC AND ANAEROBIC 5CC  Final   Culture NO GROWTH 2 DAYS  Final   Report Status PENDING  Incomplete  Culture, blood (routine x 2)     Status: None (Preliminary result)   Collection Time: 03/28/15  3:05 PM  Result Value Ref Range Status   Specimen Description BLOOD RIGHT HAND  Final   Special Requests BOTTLES DRAWN AEROBIC ONLY 8.5CC  Final   Culture NO  GROWTH 2 DAYS  Final   Report Status PENDING  Incomplete  Gram stain     Status: None   Collection Time: 03/30/15 10:52 AM  Result Value Ref Range Status   Specimen Description FLUID SYNOVIAL RIGHT SHOULDER  Final   Special Requests NONE  Final   Gram Stain   Final    MODERATE WBC PRESENT, PREDOMINANTLY PMN FEW GRAM POSITIVE COCCI IN PAIRS IN CHAINS    Report Status 03/30/2015 FINAL  Final    Impression/Plan:  1. GAS bacteremia - source seems to be adenitis.  On penicillin. 2. Septic arthritis - c/w GAS on gram stain.  Will need prolonged IV penicillin at discharge.  Going now to OR.  3. Low albumin - was a little low on admission, worse now. May need supplements after surgery if she is not eating. 4.  Mild transaminitis - normal on admission.  Will monitor.

## 2015-03-30 NOTE — Progress Notes (Signed)
03/30/15 21:45 Patient arrived from [facility] and oriented to person,disoriented to place,time and situation.Reoriented to time ,place and situation.Sling in use to right arm,dressing to rt arm dry and intactVS taken .Husband at bedside.Will continue to monitor.Valaria Good, Wonda Cheng, RN

## 2015-03-30 NOTE — Anesthesia Procedure Notes (Addendum)
Anesthesia Regional Block:  Interscalene brachial plexus block  Pre-Anesthetic Checklist: ,, timeout performed, Correct Patient, Correct Site, Correct Laterality, Correct Procedure, Correct Position, site marked, Risks and benefits discussed,  Surgical consent,  Pre-op evaluation,  At surgeon's request and post-op pain management  Laterality: Right  Prep: chloraprep       Needles:  Injection technique: Single-shot  Needle Type: Echogenic Stimulator Needle     Needle Length: 9cm 9 cm Needle Gauge: 21 and 21 G    Additional Needles:  Procedures: ultrasound guided (picture in chart) and nerve stimulator Interscalene brachial plexus block  Nerve Stimulator or Paresthesia:  Response: 0.4 mA,   Additional Responses:   Narrative:  Start time: 03/30/2015 5:05 PM End time: 03/30/2015 5:15 PM Injection made incrementally with aspirations every 5 mL.  Performed by: Personally  Anesthesiologist: Lillia Abed  Additional Notes: Monitors applied. Patient sedated. Sterile prep and drape,hand hygiene and sterile gloves were used. Relevant anatomy identified.Needle position confirmed.Local anesthetic injected incrementally after negative aspiration. Local anesthetic spread visualized around nerve(s). Vascular puncture avoided. No complications. Image printed for medical record.The patient tolerated the procedure well.        Procedure Name: Intubation Date/Time: 03/30/2015 6:45 PM Performed by: Sampson Si E Pre-anesthesia Checklist: Patient identified, Emergency Drugs available, Suction available, Patient being monitored and Timeout performed Patient Re-evaluated:Patient Re-evaluated prior to inductionOxygen Delivery Method: Circle system utilized Preoxygenation: Pre-oxygenation with 100% oxygen Intubation Type: IV induction Ventilation: Mask ventilation without difficulty Laryngoscope Size: Mac and 3 Grade View: Grade I Tube type: Oral Tube size: 7.0 mm Number of attempts:  1 Airway Equipment and Method: Stylet Placement Confirmation: ETT inserted through vocal cords under direct vision,  positive ETCO2 and breath sounds checked- equal and bilateral Secured at: 22 cm Tube secured with: Tape Dental Injury: Teeth and Oropharynx as per pre-operative assessment

## 2015-03-30 NOTE — Transfer of Care (Signed)
Immediate Anesthesia Transfer of Care Note  Patient: [name]  Procedure(s) Performed: Procedure(s): IRRIGATION AND DEBRIDEMENT EXTREMITY (Right) ARTHROSCOPY SHOULDER (Right)  Patient Location: PACU  Anesthesia Type:General  Level of Consciousness: lethargic and responds to stimulation  Airway & Oxygen Therapy: Patient Spontanous Breathing and Patient connected to nasal cannula oxygen  Post-op Assessment: Report given to RN  Post vital signs: Reviewed and stable  Last Vitals:  Filed Vitals:   03/30/15 1820 03/30/15 1825  BP: 162/76 153/96  Pulse: 110 110  Temp:    Resp: 17 18    Complications: No apparent anesthesia complications

## 2015-03-30 NOTE — Progress Notes (Addendum)
Subjective: Pt has no CP  Breathing is OK  Complains of L wrist pain   Denies palpitations    Filed Vitals:   03/29/15 1106 03/29/15 1819 03/29/15 2033 03/30/15 0431  BP: 129/80 134/82 120/72 146/86  Pulse: 111 109 110 111  Temp: 99 F (37.2 C) 99.8 F (37.7 C) 99.7 F (37.6 C) 99.6 F (37.6 C)  TempSrc: Oral Oral Oral Oral  Resp: 20 18 17 16   Height:      Weight:   87.5 kg (192 lb 14.4 oz)   SpO2: 94% 94% 97% 93%   Weight change: 7.45 kg (16 lb 6.8 oz)  Intake/Output Summary (Last 24 hours) at 03/30/15 0753 Last data filed at 03/30/15 0600  Gross per 24 hour  Intake    120 ml  Output      0 ml  Net    120 ml    General: Alert, awake, oriented x3, in no acute distress Neck:  JVP is normal Heart: Regular rate and rhythm, without murmurs, rubs, gallops.  Lungs: Clear to auscultation.  No rales or wheezes. Exemities:  No edema.   Neuro: Grossly intact, nonfocal.  Tele:  SR/ ST  100s  1 8 Beat NSVT    Lab Results: Results for orders placed or performed during the hospital encounter of 03/27/15 (from the past 24 hour(s))  Uric acid     Status: None   Collection Time: 03/29/15  9:53 AM  Result Value Ref Range   Uric Acid, Serum 5.3 2.3 - 6.6 mg/dL  Glucose, capillary     Status: Abnormal   Collection Time: 03/29/15  1:25 PM  Result Value Ref Range   Glucose-Capillary 140 (H) 65 - 99 mg/dL  Glucose, capillary     Status: Abnormal   Collection Time: 03/29/15  6:12 PM  Result Value Ref Range   Glucose-Capillary 171 (H) 65 - 99 mg/dL  Glucose, capillary     Status: Abnormal   Collection Time: 03/29/15  9:06 PM  Result Value Ref Range   Glucose-Capillary 203 (H) 65 - 99 mg/dL    Studies/Results: Mr Shoulder Right Wo Contrast  03/30/2015  CLINICAL DATA:  Joint pain most severe in the right shoulder. Sepsis. EXAM: MRI OF THE RIGHT SHOULDER WITHOUT CONTRAST TECHNIQUE: Multiplanar, multisequence MR imaging of the shoulder was performed. No intravenous contrast was  administered. COMPARISON:  03/27/2015 FINDINGS: Rotator cuff: Prominent partial thickness articular surface tearing of the supraspinatus, with the articular surface portion retracted 1.7 cm on image 16 series 5, compatible with tendon delamination. Moderate tendinopathy of the remaining supraspinatus tendon. Similarly there is partial thickness articular surface tearing of the infraspinatus tendon with moderate infraspinatus tendinopathy. Moderate subscapularis tendinopathy is present along with potential partial thickness articular surface tearing Muscles: Abnormal edema tracks primarily along the marginal portions of the supraspinatus, subscapularis, teres minor, and teres major muscles. There is also low-level edema along the superficial fascia margin of the lateral deltoid. Biceps long head: Partial tearing or advanced tendinopathy of the intra-articular segment. Acromioclavicular Joint: Mild degenerative AC joint arthropathy. Subacromial morphology is type 2 (curved). Small but abnormal amount of fluid in the subacromial subdeltoid bursa. Glenohumeral Joint: Prominent glenohumeral joint effusion. Moderate degenerative chondral thinning along the glenohumeral joint. Labrum: Linear irregularity in the superior labrum with a blunted appearance, compatible with SLAP tear extending into the biceps anchor. Bones: Unusual flaring of the proximal humeral metadiaphysis, query prior fracture. Small right axillary lymph nodes are observed. IMPRESSION: 1. Large shoulder joint  effusion with abnormal edema tracking within and along the margins of the regional musculature. In the setting of sepsis and new onset shoulder pain, there is a high degree of concern for septic glenohumeral joint, and arthrocentesis is likely indicated. 2. Partial thickness articular surface tearing of the supraspinatus and infraspinatus with tendon delamination. There is potentially mild partial thickness articular surface tearing of the  subscapularis. Considerable tendinopathy of these tendons as well. 3. Advanced tendinopathy or partial tearing of the long head of the biceps. 4. Increased signal in the superior labrum compatible with SLAP tear extending into the biceps anchor. 5. Subacromial subdeltoid bursitis. 6. Somewhat unusual flaring of the proximal humeral metadiaphysis, query prior fracture. 7. Moderate degenerative glenohumeral arthropathy. These results will be called to the ordering clinician or representative by the Radiologist Assistant, and communication documented in the PACS or zVision Dashboard. Electronically Signed   By: Van Clines M.D.   On: 03/30/2015 07:28    Medications: Reviewed    @PROBHOSP @  1  NSVT  Asymptomatic  Echo with structurally normal heart.   Would check trop (CK was normal)  Increase metoprolol to 25 bid  and follow HR and BP  2.  Bacteremia  Identified as S Pyogenes  ID following  On ABX  I have cancelled TEE  Can reschedule if blood cultures remain positive       LOS: 3 days   Dorris Carnes 03/30/2015, 7:53 AM

## 2015-03-30 NOTE — Progress Notes (Signed)
[facility] PROGRESS NOTE  [name] VN:[id number] DOB: May 06, 1965 DOA: 03/27/2015 PCP: [name], MD   49/F with DM, HTN admitted with sepsis, AKI, found to have GAS bacteremia, R shoulder Septic arthritis, NSVT,  ID and Cards following. AKI resolved, R shoulder s/p tap today, gm stain positive, Ortho Dr.Murphy consulted  Assessment/Plan: 1. GAS Bacteremia with Septic Arthritis of R shoulder -Was on IV Daptomycin, now IV Penicillin since 1/16 per ID -had some sore throat prior to admission and son had strep throat 2weeks ago. -ID and Cardiology following -suspect Disseminated GAS, repeat Blood Cx negative thus far -per ID unlikely to have endocarditis, hence Cards deferred TEE, Due to Severe pain and swelling of R shoudler got MRi and today Tap per IR, she is growing GPC in pairs and chains in R shoulder synovial fluid, c/w seeding and septic arthritis from bacteremia -Ortho consulted, d/w Dr.Tim Murphy  2. AKI  -due to sepsis, poor Po intake, NSAIDs, ARB -Baseline creatinine thought to be within normal limits, on admit creatinine  5.26  -Nephrology consulted. Ultrasounds of abdomen and pelvis showed signs of medical renal disease and no signs of obstruction. -creatinine normalized, Renal following, IVF stopped  3. R shoulder Septic arthritis -see above  4. DM -metformin on hold, SSI, Hbaic is 7  4. NSVT -none today, had an episode of 30beats 1/15 pm -continue metoprolol, replaced K and Mag -2d ECHO with normal EF and wall motion  -Cards following  5. Polyarthritis -See discussion on R shoulder septic arthritis as above, has inflammation of R elbow, wrist, L shoulder, elbow, wrist, much less pronounced than R shoulder -unable to use NSAIDs and steroids in setting of infection  6. Elevated Bili -could be due to sepsis, ALP, AST/ALT WNL -finally improving  7. HTN -stable, holding ARB due to AKI  8. Elevated B-HCG, she is not pregnant, LMP>1year ago,  menopausal now, Pelvic US negative for gestational sac -suspect this could be from her pituitary gland, needs to be followed, GYn  As outpatient  DVt proph: Hep SQ  Code Status: Full Code Family Communication: husband, Dr.Foli Lobello at bedside Disposition Plan: Pending improvement  HPI/Subjective: C/o pain in neck, shoulders, elbows etc, R shoulder very painful until tap, now better  Objective: Filed Vitals:   03/30/15 0431 03/30/15 0937  BP: 146/86 146/89  Pulse: 111 121  Temp: 99.6 F (37.6 C) 99.3 F (37.4 C)  Resp: 16 18    Intake/Output Summary (Last 24 hours) at 03/30/15 1514 Last data filed at 03/30/15 0900  Gross per 24 hour  Intake    120 ml  Output    850 ml  Net   -730 ml   Filed Weights   03/27/15 2028 03/28/15 2025 03/29/15 2033  Weight: 79.379 kg (175 lb) 80.05 kg (176 lb 7.7 oz) 87.5 kg (192 lb 14.4 oz)    Exam:   General:  AAOx3, ill appearing  Cardiovascular: S1S2/RRR  Respiratory: CTAB  Abdomen: soft, Mildly distended, BS present  Musculoskeletal: R shoulder with swelling warmth, tenderness, also some tenderness of R elbow and wrist but improved, L arm with swelling elbow, wrist too but much less pronounced  Data Reviewed: Basic Metabolic Panel:  Recent Labs Lab 03/28/15 0015 03/28/15 0944 03/28/15 1125 03/29/15 0624 03/30/15 0715  NA 137 138 154* 134* 131*  K 3.7 3.9 4.3 3.4* 3.8  CL 104 105 114* 106 100*  CO2 20* 20* 19* 20* 21*  GLUCOSE 141* 135* 139* 198* 179*  BUN 41*  31* 28* 19 15  CREATININE 2.52* 1.72* 1.77* 1.08* 0.84  CALCIUM 7.1* 7.7* 7.7* 8.2* 8.6*  MG  --   --  1.7  --   --   PHOS 3.6 2.9  --   --   --    Liver Function Tests:  Recent Labs Lab 03/27/15 1152 03/28/15 0015 03/28/15 0944 03/28/15 1125 03/29/15 0624 03/30/15 0715  AST 41  --   --  38 32 52*  ALT 31  --   --  27 27 24   ALKPHOS 115  --   --  107 137* 161*  BILITOT 2.1*  --   --  3.1* 4.0* 2.1*  PROT 7.2  --   --  6.3* 6.5 6.2*  ALBUMIN 3.2*  2.2* 2.4* 2.3* 2.2* 1.8*    Recent Labs Lab 03/27/15 1152  LIPASE 22   No results for input(s): AMMONIA in the last 168 hours. CBC:  Recent Labs Lab 03/27/15 1152 03/28/15 0943 03/29/15 0624 03/30/15 0715  WBC 21.9* 18.2* 17.0* 22.5*  NEUTROABS 21.1*  --   --   --   HGB 11.4* 9.2* 9.3* 8.2*  HCT 33.6* 27.9* 27.9* 24.4*  MCV 82.0 82.1 81.3 81.3  PLT 176 157 168 193   Cardiac Enzymes:  Recent Labs Lab 03/28/15 0943  CKTOTAL 179   BNP (last 3 results) No results for input(s): BNP in the last 8760 hours.  ProBNP (last 3 results) No results for input(s): PROBNP in the last 8760 hours.  CBG:  Recent Labs Lab 03/29/15 1325 03/29/15 1812 03/29/15 2106 03/30/15 0743 03/30/15 1121  GLUCAP 140* 171* 203* 196* 203*    Recent Results (from the past 240 hour(s))  Urine culture     Status: None   Collection Time: 03/27/15 12:42 PM  Result Value Ref Range Status   Specimen Description URINE, CLEAN CATCH  Final   Special Requests Normal  Final   Culture   Final    >=100,000 COLONIES/mL GROUP A STREP (S.PYOGENES) ISOLATED   Report Status 03/29/2015 FINAL  Final  Culture, blood (Routine X 2) w Reflex to ID Panel     Status: None   Collection Time: 03/27/15  2:25 PM  Result Value Ref Range Status   Specimen Description BLOOD LEFT ANTECUBITAL  Final   Special Requests BOTTLES DRAWN AEROBIC ONLY 5CC  Final   Culture  Setup Time   Final    GRAM POSITIVE COCCI IN PAIRS IN CHAINS AEROBIC BOTTLE ONLY CRITICAL RESULT CALLED TO, READ BACK BY AND VERIFIED WITH: A. MINTZ,RN AT 0738 ON [id number] BY Rhea Bleacher    Culture   Final    GROUP A STREP (S.PYOGENES) ISOLATED SUSCEPTIBILITIES PERFORMED ON PREVIOUS CULTURE WITHIN THE LAST 5 DAYS.    Report Status 03/30/2015 FINAL  Final  Culture, blood (Routine X 2) w Reflex to ID Panel     Status: None   Collection Time: 03/27/15  3:00 PM  Result Value Ref Range Status   Specimen Description BLOOD RIGHT HAND  Final   Special  Requests BOTTLES DRAWN AEROBIC AND ANAEROBIC 5CC  Final   Culture  Setup Time   Final    GRAM POSITIVE COCCI IN PAIRS IN CHAINS IN BOTH AEROBIC AND ANAEROBIC BOTTLES CRITICAL RESULT CALLED TO, READ BACK BY AND VERIFIED WITH: A. MINTZ,RN AT 0848 ON [id number] BY Rhea Bleacher    Culture GROUP A STREP (S.PYOGENES) ISOLATED  Final   Report Status 03/30/2015 FINAL  Final   Organism ID, Bacteria  GROUP A STREP (S.PYOGENES) ISOLATED  Final      Susceptibility   Group a strep (s.pyogenes) isolated - MIC*    CLINDAMYCIN >=1 RESISTANT Resistant     AMPICILLIN <=0.25 SENSITIVE Sensitive     ERYTHROMYCIN >=8 RESISTANT Resistant     VANCOMYCIN <=0.12 SENSITIVE Sensitive     CEFTRIAXONE <=0.12 SENSITIVE Sensitive     LEVOFLOXACIN <=0.25 SENSITIVE Sensitive     * GROUP A STREP (S.PYOGENES) ISOLATED  Culture, blood (routine x 2)     Status: None (Preliminary result)   Collection Time: 03/28/15  3:00 PM  Result Value Ref Range Status   Specimen Description BLOOD RIGHT HAND  Final   Special Requests BOTTLES DRAWN AEROBIC AND ANAEROBIC 5CC  Final   Culture NO GROWTH 2 DAYS  Final   Report Status PENDING  Incomplete  Culture, blood (routine x 2)     Status: None (Preliminary result)   Collection Time: 03/28/15  3:05 PM  Result Value Ref Range Status   Specimen Description BLOOD RIGHT HAND  Final   Special Requests BOTTLES DRAWN AEROBIC ONLY 8.5CC  Final   Culture NO GROWTH 2 DAYS  Final   Report Status PENDING  Incomplete  Gram stain     Status: None   Collection Time: 03/30/15 10:52 AM  Result Value Ref Range Status   Specimen Description FLUID SYNOVIAL RIGHT SHOULDER  Final   Special Requests NONE  Final   Gram Stain   Final    MODERATE WBC PRESENT, PREDOMINANTLY PMN FEW GRAM POSITIVE COCCI IN PAIRS IN CHAINS    Report Status 03/30/2015 FINAL  Final     Studies: Mr Shoulder Right Wo Contrast  03/30/2015  CLINICAL DATA:  Joint pain most severe in the right shoulder. Sepsis. EXAM: MRI OF THE  RIGHT SHOULDER WITHOUT CONTRAST TECHNIQUE: Multiplanar, multisequence MR imaging of the shoulder was performed. No intravenous contrast was administered. COMPARISON:  03/27/2015 FINDINGS: Rotator cuff: Prominent partial thickness articular surface tearing of the supraspinatus, with the articular surface portion retracted 1.7 cm on image 16 series 5, compatible with tendon delamination. Moderate tendinopathy of the remaining supraspinatus tendon. Similarly there is partial thickness articular surface tearing of the infraspinatus tendon with moderate infraspinatus tendinopathy. Moderate subscapularis tendinopathy is present along with potential partial thickness articular surface tearing Muscles: Abnormal edema tracks primarily along the marginal portions of the supraspinatus, subscapularis, teres minor, and teres major muscles. There is also low-level edema along the superficial fascia margin of the lateral deltoid. Biceps long head: Partial tearing or advanced tendinopathy of the intra-articular segment. Acromioclavicular Joint: Mild degenerative AC joint arthropathy. Subacromial morphology is type 2 (curved). Small but abnormal amount of fluid in the subacromial subdeltoid bursa. Glenohumeral Joint: Prominent glenohumeral joint effusion. Moderate degenerative chondral thinning along the glenohumeral joint. Labrum: Linear irregularity in the superior labrum with a blunted appearance, compatible with SLAP tear extending into the biceps anchor. Bones: Unusual flaring of the proximal humeral metadiaphysis, query prior fracture. Small right axillary lymph nodes are observed. IMPRESSION: 1. Large shoulder joint effusion with abnormal edema tracking within and along the margins of the regional musculature. In the setting of sepsis and new onset shoulder pain, there is a high degree of concern for septic glenohumeral joint, and arthrocentesis is likely indicated. 2. Partial thickness articular surface tearing of the  supraspinatus and infraspinatus with tendon delamination. There is potentially mild partial thickness articular surface tearing of the subscapularis. Considerable tendinopathy of these tendons as well. 3. Advanced tendinopathy or  partial tearing of the long head of the biceps. 4. Increased signal in the superior labrum compatible with SLAP tear extending into the biceps anchor. 5. Subacromial subdeltoid bursitis. 6. Somewhat unusual flaring of the proximal humeral metadiaphysis, query prior fracture. 7. Moderate degenerative glenohumeral arthropathy. These results will be called to the ordering clinician or representative by the Radiologist Assistant, and communication documented in the PACS or zVision Dashboard. Electronically Signed   By: Van Clines M.D.   On: 03/30/2015 07:28   Dg Fluoro Guided Needle Plc Aspiration/injection Loc  03/30/2015  CLINICAL DATA:  Prior MRI suspicious for septic glenohumeral joint. Sepsis. EXAM: FLUORO GUIDED NEEDLE PLACEMENT FLUOROSCOPY TIME:  If the device does not provide the exposure index: Fluoroscopy Time (in minutes and seconds):  1 minutes and 42 seconds Number of Acquired Images:  None COMPARISON:  MRI of 03/29/2015. FINDINGS: Informed written and verbal consent were obtained. Risks and benefits of the procedure were discussed. A "Time out" was performed. The superior medial portion of the right humeral head was localized under fluoroscopic guidance. Skin was prepped and draped in standard sterile fashion. Skin was numbed with 1% lidocaine. A 20 gauge needle was inserted into the joint. No fluid could be aspirated. Intra-articular location was confirmed with approximately 6 cc of Omnipaque 180. Subsequent, approximately 5 cc of saline were injected. Only minimal fluid could be returned. IMPRESSION: Uncomplicated aspiration of the right shoulder, as detailed above. Minimal fluid was obtained, and sent to laboratory as requested. Electronically Signed   By: Abigail Miyamoto M.D.   On: 03/30/2015 12:11    Scheduled Meds: . insulin aspart  0-9 Units Subcutaneous TID WC  . insulin aspart  3 Units Subcutaneous TID WC  . metoprolol tartrate  25 mg Oral BID  . pencillin G potassium IV  4 Million Units Intravenous Q4H  . sodium chloride       Continuous Infusions:   Antibiotics Given (last 72 hours)    Date/Time Action Medication Dose Rate   03/27/15 1853 Given   DAPTOmycin (CUBICIN) 473.5 mg in sodium chloride 0.9 % IVPB 473.5 mg 218.9 mL/hr   03/29/15 1156 Given   DAPTOmycin (CUBICIN) 480 mg in sodium chloride 0.9 % IVPB 480 mg 219.2 mL/hr   03/29/15 1630 Given   penicillin G potassium 4 Million Units in dextrose 5 % 250 mL IVPB 4 Million Units 250 mL/hr   03/29/15 2046 Given   penicillin G potassium 4 Million Units in dextrose 5 % 250 mL IVPB 4 Million Units 250 mL/hr   03/30/15 0029 Given   penicillin G potassium 4 Million Units in dextrose 5 % 250 mL IVPB 4 Million Units 250 mL/hr   03/30/15 [id number] Given   penicillin G potassium 4 Million Units in dextrose 5 % 250 mL IVPB 4 Million Units 250 mL/hr   03/30/15 0824 Given   penicillin G potassium 4 Million Units in dextrose 5 % 250 mL IVPB 4 Million Units 250 mL/hr   03/30/15 1206 Given   penicillin G potassium 4 Million Units in dextrose 5 % 250 mL IVPB 4 Million Units 250 mL/hr      Principal Problem:   Streptococcal bacteremia Active Problems:   Acute renal failure (ARF) (HCC)   Abdominal pain, vomiting, and diarrhea   Posterior cervical adenopathy   Leukocytosis   Diabetes mellitus (HCC)   Hypertension   Polyarthralgia   NSVT (nonsustained ventricular tachycardia) (HCC)   Sinus tachycardia (HCC)   Neck pain on left side  Arthritis    Time spent: 37min    Tambi Thole  [facility] Pager [phone]. If 7PM-7AM, please contact night-coverage at [email or web address], password [facility] 03/30/2015, 3:14 PM  LOS: 3 days

## 2015-03-30 NOTE — Anesthesia Preprocedure Evaluation (Signed)
Anesthesia Evaluation  Patient identified by MRN, date of birth, ID band Patient awake    Reviewed: Allergy & Precautions, NPO status , Patient's Chart, lab work & pertinent test results  Airway Mallampati: I  TM Distance: >3 FB Neck ROM: Full    Dental   Pulmonary    Pulmonary exam normal        Cardiovascular hypertension, Pt. on medications Normal cardiovascular exam     Neuro/Psych    GI/Hepatic   Endo/Other  diabetes, Type 2, Oral Hypoglycemic Agents  Renal/GU      Musculoskeletal   Abdominal   Peds  Hematology   Anesthesia Other Findings   Reproductive/Obstetrics                             Anesthesia Physical Anesthesia Plan  ASA: II  Anesthesia Plan: General   Post-op Pain Management: GA combined w/ Regional for post-op pain   Induction: Intravenous  Airway Management Planned: Oral ETT  Additional Equipment:   Intra-op Plan:   Post-operative Plan: Extubation in OR  Informed Consent: I have reviewed the patients History and Physical, chart, labs and discussed the procedure including the risks, benefits and alternatives for the proposed anesthesia with the patient or authorized representative who has indicated his/her understanding and acceptance.     Plan Discussed with: CRNA and Surgeon  Anesthesia Plan Comments:         Anesthesia Quick Evaluation

## 2015-03-30 NOTE — Progress Notes (Signed)
Husband's phone number [phone]

## 2015-03-30 NOTE — Op Note (Signed)
03/27/2015 - 03/30/2015  7:43 PM  PATIENT:  [name]    PRE-OPERATIVE DIAGNOSIS:  septic Right shoulder  POST-OPERATIVE DIAGNOSIS:  Same  PROCEDURE:  IRRIGATION AND DEBRIDEMENT EXTREMITY, ARTHROSCOPY SHOULDER  SURGEON:  Jacquette Canales, Ernesta Amble, MD  ASSISTANT: Lovett Calender, PA-C, She was present and scrubbed throughout the case, critical for completion in a timely fashion, and for retraction, instrumentation, and closure.   ANESTHESIA:   gen  PREOPERATIVE INDICATIONS:  [name] is a  50 y.o. female with a diagnosis of septic Right shoulder who failed conservative measures and elected for surgical management.    The risks benefits and alternatives were discussed with the patient preoperatively including but not limited to the risks of infection, bleeding, nerve injury, cardiopulmonary complications, the need for revision surgery, among others, and the patient was willing to proceed.  OPERATIVE IMPLANTS: none  OPERATIVE FINDINGS: Purulent fluid noted from her glenohumeral joint. She had a small undersurface articular sided tear of roughly 40%.  BLOOD LOSS: min  COMPLICATIONS: none  TOURNIQUET TIME: none  OPERATIVE PROCEDURE:  Patient was identified in the preoperative holding area and site was marked by me She was transported to the operating theater and placed on the table in supine position taking care to pad all bony prominences. After a preincinduction time out anesthesia was induced. The right upper extremity was prepped and draped in normal sterile fashion and a pre-incision timeout was performed. She received ancef for preoperative antibiotics.   She is in place in the beachchair position again padding all bony prominences. I made a posterior and anterior arthroscopic portals I inserted the arthroscope into her glenohumeral joint medially noted was purulent fluid this was collected and sent to the lab.  Next I inserted the arthroscope as well as the shaver through  the anterior portal and performed an extensive debridement of her reactive synovitis. She had a small SLAP tear as well as a undersurface articular sided cuff tear that was very small.  After this extensive debridement due to the possibility of a full-thickness cuff tear that was not visible I did elect to insert the arthroscope into the subacromial space as well to confirm a complete washout here also. I inserted the arthroscope into the subacromial space and performed an extensive debridement of her bursa here. There was no bursal sided cuff tear noted. All told  All told I ran 9 L of fluid through her shoulder and glenohumeral and bursal space.  I then removed the arthroscope expressed our scopic fluid closer portals with simple stitches and a sterile dressing was applied she was awoken and taken the PACU in stable condition.  POST OPERATIVE PLAN: ROM as tolerated    This note was generated using a template and dragon dictation system. In light of that, I have reviewed the note and all aspects of it are applicable to this case. Any dictation errors are due to the computerized dictation system.

## 2015-03-30 NOTE — Progress Notes (Signed)
Patient had 8 beats of Vtach. On call NP notified. RN will continue to monitor patient.   Ermalinda Memos, RN

## 2015-03-31 ENCOUNTER — Encounter (HOSPITAL_COMMUNITY): Payer: Self-pay | Admitting: Orthopedic Surgery

## 2015-03-31 ENCOUNTER — Inpatient Hospital Stay (HOSPITAL_COMMUNITY): Payer: Managed Care, Other (non HMO)

## 2015-03-31 DIAGNOSIS — J90 Pleural effusion, not elsewhere classified: Secondary | ICD-10-CM

## 2015-03-31 DIAGNOSIS — J9601 Acute respiratory failure with hypoxia: Secondary | ICD-10-CM

## 2015-03-31 DIAGNOSIS — Z9889 Other specified postprocedural states: Secondary | ICD-10-CM

## 2015-03-31 DIAGNOSIS — M1990 Unspecified osteoarthritis, unspecified site: Secondary | ICD-10-CM

## 2015-03-31 DIAGNOSIS — R599 Enlarged lymph nodes, unspecified: Secondary | ICD-10-CM

## 2015-03-31 DIAGNOSIS — R410 Disorientation, unspecified: Secondary | ICD-10-CM

## 2015-03-31 LAB — CBC
HEMATOCRIT: 23.6 % — AB (ref 36.0–46.0)
HEMOGLOBIN: 7.9 g/dL — AB (ref 12.0–15.0)
MCH: 27.1 pg (ref 26.0–34.0)
MCHC: 33.5 g/dL (ref 30.0–36.0)
MCV: 81.1 fL (ref 78.0–100.0)
Platelets: 177 10*3/uL (ref 150–400)
RBC: 2.91 MIL/uL — AB (ref 3.87–5.11)
RDW: 13.9 % (ref 11.5–15.5)
WBC: 21.2 10*3/uL — AB (ref 4.0–10.5)

## 2015-03-31 LAB — COMPREHENSIVE METABOLIC PANEL
ALBUMIN: 1.7 g/dL — AB (ref 3.5–5.0)
ALT: 22 U/L (ref 14–54)
AST: 36 U/L (ref 15–41)
Alkaline Phosphatase: 114 U/L (ref 38–126)
Anion gap: 10 (ref 5–15)
BUN: 16 mg/dL (ref 6–20)
CHLORIDE: 102 mmol/L (ref 101–111)
CO2: 21 mmol/L — ABNORMAL LOW (ref 22–32)
Calcium: 8.5 mg/dL — ABNORMAL LOW (ref 8.9–10.3)
Creatinine, Ser: 0.95 mg/dL (ref 0.44–1.00)
GFR calc Af Amer: >60 mL/min (ref >60)
GFR calc non Af Amer: >60 mL/min (ref >60)
GLUCOSE: 196 mg/dL — AB (ref 65–99)
POTASSIUM: 3.8 mmol/L (ref 3.5–5.1)
Sodium: 133 mmol/L — ABNORMAL LOW (ref 135–145)
Total Bilirubin: 1.2 mg/dL (ref 0.3–1.2)
Total Protein: 6 g/dL — ABNORMAL LOW (ref 6.5–8.1)

## 2015-03-31 LAB — BLOOD GAS, ARTERIAL
Acid-base deficit: 1.7 mmol/L (ref 0.0–2.0)
Acid-base deficit: 4.1 mmol/L — ABNORMAL HIGH (ref 0.0–2.0)
Bicarbonate: 19.5 mEq/L — ABNORMAL LOW (ref 20.0–24.0)
Bicarbonate: 21.4 mEq/L (ref 20.0–24.0)
DRAWN BY: 225631
Drawn by: 437071
O2 CONTENT: 2 L/min
O2 CONTENT: 3 L/min
O2 Saturation: 89.8 %
O2 Saturation: 98 %
PCO2 ART: 29.7 mmHg — AB (ref 35.0–45.0)
PCO2 ART: 30.1 mmHg — AB (ref 35.0–45.0)
PH ART: 7.428 (ref 7.350–7.450)
PH ART: 7.471 — AB (ref 7.350–7.450)
Patient temperature: 98.6
Patient temperature: 98.6
TCO2: 20.4 mmol/L (ref 0–100)
TCO2: 22.3 mmol/L (ref 0–100)
pO2, Arterial: 102 mmHg — ABNORMAL HIGH (ref 80.0–100.0)
pO2, Arterial: 60.5 mmHg — ABNORMAL LOW (ref 80.0–100.0)

## 2015-03-31 LAB — POCT I-STAT 3, ART BLOOD GAS (G3+)
ACID-BASE EXCESS: 3 mmol/L — AB (ref 0.0–2.0)
Bicarbonate: 25.9 mEq/L — ABNORMAL HIGH (ref 20.0–24.0)
O2 SAT: 100 %
PCO2 ART: 35.3 mmHg (ref 35.0–45.0)
Patient temperature: 37.8
TCO2: 27 mmol/L (ref 0–100)
pH, Arterial: 7.476 — ABNORMAL HIGH (ref 7.350–7.450)
pO2, Arterial: 378 mmHg — ABNORMAL HIGH (ref 80.0–100.0)

## 2015-03-31 LAB — GLUCOSE, CAPILLARY
GLUCOSE-CAPILLARY: 196 mg/dL — AB (ref 65–99)
GLUCOSE-CAPILLARY: 238 mg/dL — AB (ref 65–99)

## 2015-03-31 LAB — PREPARE RBC (CROSSMATCH)

## 2015-03-31 LAB — ABO/RH: ABO/RH(D): O POS

## 2015-03-31 LAB — MRSA PCR SCREENING: MRSA by PCR: NEGATIVE

## 2015-03-31 LAB — AMMONIA: Ammonia: 49 umol/L — ABNORMAL HIGH (ref 9–35)

## 2015-03-31 MED ORDER — NALOXONE HCL 0.4 MG/ML IJ SOLN
0.4000 mg | INTRAMUSCULAR | Status: AC
  Administered 2015-03-31: 0.4 mg via INTRAVENOUS

## 2015-03-31 MED ORDER — METOPROLOL TARTRATE 1 MG/ML IV SOLN
5.0000 mg | INTRAVENOUS | Status: AC
  Administered 2015-03-31: 5 mg via INTRAVENOUS

## 2015-03-31 MED ORDER — FUROSEMIDE 10 MG/ML IJ SOLN
20.0000 mg | Freq: Once | INTRAMUSCULAR | Status: AC
  Administered 2015-03-31: 20 mg via INTRAVENOUS
  Filled 2015-03-31: qty 2

## 2015-03-31 MED ORDER — AMLODIPINE BESYLATE 2.5 MG PO TABS
2.5000 mg | ORAL_TABLET | Freq: Two times a day (BID) | ORAL | Status: DC
  Administered 2015-03-31 – 2015-04-01 (×2): 2.5 mg via ORAL
  Filled 2015-03-31 (×2): qty 1

## 2015-03-31 MED ORDER — SODIUM CHLORIDE 0.9 % IV SOLN
INTRAVENOUS | Status: DC
  Administered 2015-04-01: 21:00:00 via INTRAVENOUS

## 2015-03-31 MED ORDER — NALOXONE HCL 0.4 MG/ML IJ SOLN
INTRAMUSCULAR | Status: AC
  Filled 2015-03-31: qty 1

## 2015-03-31 MED ORDER — METOPROLOL TARTRATE 1 MG/ML IV SOLN
INTRAVENOUS | Status: AC
  Filled 2015-03-31: qty 5

## 2015-03-31 MED ORDER — SODIUM CHLORIDE 0.9 % IV SOLN
Freq: Once | INTRAVENOUS | Status: AC
  Administered 2015-03-31: 16:00:00 via INTRAVENOUS

## 2015-03-31 MED ORDER — FUROSEMIDE 10 MG/ML IJ SOLN
60.0000 mg | Freq: Once | INTRAMUSCULAR | Status: AC
  Administered 2015-03-31: 60 mg via INTRAVENOUS
  Filled 2015-03-31: qty 6

## 2015-03-31 MED ORDER — METOPROLOL TARTRATE 25 MG PO TABS
25.0000 mg | ORAL_TABLET | Freq: Four times a day (QID) | ORAL | Status: DC
  Administered 2015-03-31 – 2015-04-01 (×5): 25 mg via ORAL
  Filled 2015-03-31 (×5): qty 1

## 2015-03-31 NOTE — Progress Notes (Signed)
abg collected  

## 2015-03-31 NOTE — Progress Notes (Addendum)
[facility] PROGRESS NOTE  [name] VN:[id number] DOB: 06-Feb-1966 DOA: 03/27/2015 PCP: [name], MD   49/F with DM, HTN admitted with sepsis, AKI, found to have GAS bacteremia, R shoulder Septic arthritis, NSVT,  ID and Cards following. AKI resolved, R shoulder s/p tap today, gm stain positive, Ortho Dr.Murphy consulted  Assessment/Plan GAS Bacteremia with Septic Arthritis of R shoulder -Was on IV Daptomycin, now IV Penicillin since 1/16 per ID -had some sore throat prior to admission and son had strep throat 2weeks ago. -ID and Cardiology following Bacteremia Identified as S Pyogenes ID following On ABX , cardiology has cancelled TEE , Can reschedule if blood cultures remain positive  -per ID unlikely to have endocarditis, hence Cards deferred TEE, Due to Severe pain and swelling of R shoudler got MRi and today Tap per IR, she is growing GPC in pairs and chains in R shoulder synovial fluid, c/w seeding and septic arthritis from bacteremia -Ortho consulted, d/w Dr.Tim Percell Miller, patient is status post arthroscopy, irrigation and debridement of right shoulder   Right lower lobe pneumonia/HCAP Patient is small to moderate-sized pleural effusion, possible right basilar air space opacity, atelectasis versus pneumonia, increasing white count 21.2 this morning Patient currently on cefazolin and penicillin G Infectious disease following, would notify Dr.[name] , about this new finding PCCM consult requested per husband Unable to do ultrasound-guided thoracentesis due to very small amount of fluid   Family requested transfer to [place], discussed with hospitalist (Dr Beckey Downing)  there who has not accepted patient as they are on diversion due to unavailability of beds , husband aware that patient has not been accepted due to no medical indication for transfer  ABG 7.47, 29.7, 102  2. AKI Resolved, now 0.95 -due to sepsis, poor Po intake, NSAIDs, ARB -Baseline creatinine  thought to be within normal limits, on admit creatinine  5.26  -Nephrology consulted. Ultrasounds of abdomen and pelvis showed signs of medical renal disease and no signs of obstruction. -creatinine normalized, Renal following, IVF stopped   3. R shoulder Septic arthritis -see above  4. DM -metformin on hold, SSI, Hbaic is 7  4. NSVT -none today, had an episode of 30beats 1/15 pm -continue metoprolol, replaced K and Mag -2d ECHO with normal EF and wall motion  -Cards following  5. Polyarthritis -See discussion on R shoulder septic arthritis as above, has inflammation of R elbow, wrist, L shoulder, elbow, wrist, much less pronounced than R shoulder -unable to use NSAIDs and steroids in setting of infection  6. Elevated Bili -could be due to sepsis, ALP, AST/ALT WNL -finally improving  7. HTN -stable, holding ARB due to AKI  8. Elevated B-HCG, she is not pregnant, LMP>1year ago, menopausal now, Pelvic US negative for gestational sac -suspect this could be from her pituitary gland, needs to be followed, GYn  As outpatient  9. Anemia of chronic disease, hemoglobin 7.9, husband has requested one unit of packed red blood cells, repeat CBC tomorrow, FOBT  DVt proph: Hep SQ  Code Status: Full Code Family Communication: husband, Dr.Foli Mangano at bedside Disposition Plan   Pending insurance approval for transfer to [facility], contingent upon bed availability, as of 12:00 pm   Husband changed his mind and would like the patient to continue her care in [facility]     HPI/SubjectiveHusband by the bedside, upset about unavailability of MD cross coverage overnight  patient appears to be comfortable and stable Denies any cough, chest pain, shortness of breath   Objective: [employer]  Vitals:   03/31/15 0737 03/31/15 0839  BP: 152/87 149/89  Pulse: 109 112  Temp: 99.7 F (37.6 C)   Resp: 22 22    Intake/Output Summary (Last 24 hours) at 03/31/15 1027 Last data filed at 03/31/15  [id number]  Gross per 24 hour  Intake   1389 ml  Output   1850 ml  Net   -461 ml   Filed Weights   03/28/15 2025 03/29/15 2033 03/30/15 2148  Weight: 80.05 kg (176 lb 7.7 oz) 87.5 kg (192 lb 14.4 oz) 88.1 kg (194 lb 3.6 oz)    Exam:   General:  AAOx3, ill appearing  Cardiovascular: S1S2/RRR  Respiratory: CTAB  Abdomen: soft, Mildly distended, BS present  Musculoskeletal: R shoulder with swelling warmth, tenderness, also some tenderness of R elbow and wrist but improved, L arm with swelling elbow, wrist too but much less pronounced  Data Reviewed: Basic Metabolic Panel:  Recent Labs Lab 03/28/15 0015 03/28/15 0944 03/28/15 1125 03/29/15 0624 03/30/15 0715 03/31/15 0105  NA 137 138 154* 134* 131* 133*  K 3.7 3.9 4.3 3.4* 3.8 3.8  CL 104 105 114* 106 100* 102  CO2 20* 20* 19* 20* 21* 21*  GLUCOSE 141* 135* 139* 198* 179* 196*  BUN 41* 31* 28* 19 15 16   CREATININE 2.52* 1.72* 1.77* 1.08* 0.84 0.95  CALCIUM 7.1* 7.7* 7.7* 8.2* 8.6* 8.5*  MG  --   --  1.7  --   --   --   PHOS 3.6 2.9  --   --   --   --    Liver Function Tests:  Recent Labs Lab 03/27/15 1152  03/28/15 0944 03/28/15 1125 03/29/15 0624 03/30/15 0715 03/31/15 0105  AST 41  --   --  38 32 52* 36  ALT 31  --   --  27 27 24 22   ALKPHOS 115  --   --  107 137* 161* 114  BILITOT 2.1*  --   --  3.1* 4.0* 2.1* 1.2  PROT 7.2  --   --  6.3* 6.5 6.2* 6.0*  ALBUMIN 3.2*  < > 2.4* 2.3* 2.2* 1.8* 1.7*  < > = values in this interval not displayed.  Recent Labs Lab 03/27/15 1152  LIPASE 22    Recent Labs Lab 03/31/15 0105  AMMONIA 49*   CBC:  Recent Labs Lab 03/27/15 1152 03/28/15 0943 03/29/15 0624 03/30/15 0715 03/31/15 0105  WBC 21.9* 18.2* 17.0* 22.5* 21.2*  NEUTROABS 21.1*  --   --   --   --   HGB 11.4* 9.2* 9.3* 8.2* 7.9*  HCT 33.6* 27.9* 27.9* 24.4* 23.6*  MCV 82.0 82.1 81.3 81.3 81.1  PLT 176 157 168 193 177   Cardiac Enzymes:  Recent Labs Lab 03/28/15 0943  CKTOTAL 179   BNP  (last 3 results) No results for input(s): BNP in the last 8760 hours.  ProBNP (last 3 results) No results for input(s): PROBNP in the last 8760 hours.  CBG:  Recent Labs Lab 03/30/15 1517 03/30/15 1724 03/30/15 2004 03/30/15 2145 03/31/15 0734  GLUCAP 146* 126* 125* 160* 196*    Recent Results (from the past 240 hour(s))  Urine culture     Status: None   Collection Time: 03/27/15 12:42 PM  Result Value Ref Range Status   Specimen Description URINE, CLEAN CATCH  Final   Special Requests Normal  Final   Culture   Final    >=100,000 COLONIES/mL GROUP A STREP (S.PYOGENES) ISOLATED  Report Status 03/29/2015 FINAL  Final  Culture, blood (Routine X 2) w Reflex to ID Panel     Status: None   Collection Time: 03/27/15  2:25 PM  Result Value Ref Range Status   Specimen Description BLOOD LEFT ANTECUBITAL  Final   Special Requests BOTTLES DRAWN AEROBIC ONLY 5CC  Final   Culture  Setup Time   Final    GRAM POSITIVE COCCI IN PAIRS IN CHAINS AEROBIC BOTTLE ONLY CRITICAL RESULT CALLED TO, READ BACK BY AND VERIFIED WITH: A. MINTZ,RN AT 0738 ON [id number] BY Rhea Bleacher    Culture   Final    GROUP A STREP (S.PYOGENES) ISOLATED SUSCEPTIBILITIES PERFORMED ON PREVIOUS CULTURE WITHIN THE LAST 5 DAYS.    Report Status 03/30/2015 FINAL  Final  Culture, blood (Routine X 2) w Reflex to ID Panel     Status: None   Collection Time: 03/27/15  3:00 PM  Result Value Ref Range Status   Specimen Description BLOOD RIGHT HAND  Final   Special Requests BOTTLES DRAWN AEROBIC AND ANAEROBIC 5CC  Final   Culture  Setup Time   Final    GRAM POSITIVE COCCI IN PAIRS IN CHAINS IN BOTH AEROBIC AND ANAEROBIC BOTTLES CRITICAL RESULT CALLED TO, READ BACK BY AND VERIFIED WITH: A. MINTZ,RN AT 0848 ON [id number] BY Rhea Bleacher    Culture GROUP A STREP (S.PYOGENES) ISOLATED  Final   Report Status 03/30/2015 FINAL  Final   Organism ID, Bacteria GROUP A STREP (S.PYOGENES) ISOLATED  Final      Susceptibility   Group a  strep (s.pyogenes) isolated - MIC*    CLINDAMYCIN >=1 RESISTANT Resistant     AMPICILLIN <=0.25 SENSITIVE Sensitive     ERYTHROMYCIN >=8 RESISTANT Resistant     VANCOMYCIN <=0.12 SENSITIVE Sensitive     CEFTRIAXONE <=0.12 SENSITIVE Sensitive     LEVOFLOXACIN <=0.25 SENSITIVE Sensitive     * GROUP A STREP (S.PYOGENES) ISOLATED  Culture, blood (routine x 2)     Status: None (Preliminary result)   Collection Time: 03/28/15  3:00 PM  Result Value Ref Range Status   Specimen Description BLOOD RIGHT HAND  Final   Special Requests BOTTLES DRAWN AEROBIC AND ANAEROBIC 5CC  Final   Culture NO GROWTH 3 DAYS  Final   Report Status PENDING  Incomplete  Culture, blood (routine x 2)     Status: None (Preliminary result)   Collection Time: 03/28/15  3:05 PM  Result Value Ref Range Status   Specimen Description BLOOD RIGHT HAND  Final   Special Requests BOTTLES DRAWN AEROBIC ONLY 8.5CC  Final   Culture NO GROWTH 3 DAYS  Final   Report Status PENDING  Incomplete  Gram stain     Status: None   Collection Time: 03/30/15 10:52 AM  Result Value Ref Range Status   Specimen Description FLUID SYNOVIAL RIGHT SHOULDER  Final   Special Requests NONE  Final   Gram Stain   Final    MODERATE WBC PRESENT, PREDOMINANTLY PMN FEW GRAM POSITIVE COCCI IN PAIRS IN CHAINS    Report Status 03/30/2015 FINAL  Final  Fungus Culture with Smear     Status: None (Preliminary result)   Collection Time: 03/30/15 10:52 AM  Result Value Ref Range Status   Specimen Description FLUID SYNOVIAL RIGHT SHOULDER  Final   Special Requests NONE  Final   Fungal Smear   Final    NO YEAST OR FUNGAL ELEMENTS SEEN Performed at [employer] [facility]  Final    CULTURE IN PROGRESS FOR FOUR WEEKS Performed at [facility]    Report Status PENDING  Incomplete  Anaerobic culture     Status: None (Preliminary result)   Collection Time: 03/30/15  7:42 PM  Result Value Ref Range Status   Specimen Description SYNOVIAL  RIGHT SHOULDER  Final   Special Requests PATIENT ON FOLLOWING PENICILLIN  Final   Gram Stain   Final    ABUNDANT WBC PRESENT, PREDOMINANTLY PMN NO ORGANISMS SEEN    Culture   Final    NO ANAEROBES ISOLATED; CULTURE IN PROGRESS FOR 5 DAYS   Report Status PENDING  Incomplete  Body fluid culture     Status: None (Preliminary result)   Collection Time: 03/30/15  7:43 PM  Result Value Ref Range Status   Specimen Description SYNOVIAL RIGHT SHOULDER  Final   Special Requests PATIENT ON FOLLOWING PENICILLIN  Final   Gram Stain   Final    ABUNDANT WBC PRESENT, PREDOMINANTLY PMN NO ORGANISMS SEEN    Culture NO GROWTH < 12 HOURS  Final   Report Status PENDING  Incomplete  MRSA PCR Screening     Status: None   Collection Time: 03/31/15  1:55 AM  Result Value Ref Range Status   MRSA by PCR NEGATIVE NEGATIVE Final    Comment:        The GeneXpert MRSA Assay (FDA approved for NASAL specimens only), is one component of a comprehensive MRSA colonization surveillance program. It is not intended to diagnose MRSA infection nor to guide or monitor treatment for MRSA infections.      Studies: Mr Shoulder Right Wo Contrast  03/30/2015  CLINICAL DATA:  Joint pain most severe in the right shoulder. Sepsis. EXAM: MRI OF THE RIGHT SHOULDER WITHOUT CONTRAST TECHNIQUE: Multiplanar, multisequence MR imaging of the shoulder was performed. No intravenous contrast was administered. COMPARISON:  03/27/2015 FINDINGS: Rotator cuff: Prominent partial thickness articular surface tearing of the supraspinatus, with the articular surface portion retracted 1.7 cm on image 16 series 5, compatible with tendon delamination. Moderate tendinopathy of the remaining supraspinatus tendon. Similarly there is partial thickness articular surface tearing of the infraspinatus tendon with moderate infraspinatus tendinopathy. Moderate subscapularis tendinopathy is present along with potential partial thickness articular surface  tearing Muscles: Abnormal edema tracks primarily along the marginal portions of the supraspinatus, subscapularis, teres minor, and teres major muscles. There is also low-level edema along the superficial fascia margin of the lateral deltoid. Biceps long head: Partial tearing or advanced tendinopathy of the intra-articular segment. Acromioclavicular Joint: Mild degenerative AC joint arthropathy. Subacromial morphology is type 2 (curved). Small but abnormal amount of fluid in the subacromial subdeltoid bursa. Glenohumeral Joint: Prominent glenohumeral joint effusion. Moderate degenerative chondral thinning along the glenohumeral joint. Labrum: Linear irregularity in the superior labrum with a blunted appearance, compatible with SLAP tear extending into the biceps anchor. Bones: Unusual flaring of the proximal humeral metadiaphysis, query prior fracture. Small right axillary lymph nodes are observed. IMPRESSION: 1. Large shoulder joint effusion with abnormal edema tracking within and along the margins of the regional musculature. In the setting of sepsis and new onset shoulder pain, there is a high degree of concern for septic glenohumeral joint, and arthrocentesis is likely indicated. 2. Partial thickness articular surface tearing of the supraspinatus and infraspinatus with tendon delamination. There is potentially mild partial thickness articular surface tearing of the subscapularis. Considerable tendinopathy of these tendons as well. 3. Advanced tendinopathy or partial tearing of the long head  of the biceps. 4. Increased signal in the superior labrum compatible with SLAP tear extending into the biceps anchor. 5. Subacromial subdeltoid bursitis. 6. Somewhat unusual flaring of the proximal humeral metadiaphysis, query prior fracture. 7. Moderate degenerative glenohumeral arthropathy. These results will be called to the ordering clinician or representative by the Radiologist Assistant, and communication documented in  the PACS or zVision Dashboard. Electronically Signed   By: Van Clines M.D.   On: 03/30/2015 07:28   Dg Chest Port 1 View  03/31/2015  CLINICAL DATA:  Acute onset of shortness of breath. Initial encounter. EXAM: PORTABLE CHEST 1 VIEW COMPARISON:  Chest radiograph from 03/27/2015 FINDINGS: A small to moderate right-sided pleural effusion is noted. Right basilar airspace opacity may reflect atelectasis or pneumonia. Asymmetric interstitial edema could have a similar appearance. Underlying vascular congestion is seen. No pneumothorax is identified. The cardiomediastinal silhouette is borderline enlarged. No acute osseous abnormalities are seen. IMPRESSION: 1. Small to moderate right-sided pleural effusion noted. Right basilar airspace opacity may reflect atelectasis or pneumonia. Asymmetric interstitial edema could have a similar appearance. 2. Underlying vascular congestion and borderline cardiomegaly. Electronically Signed   By: Garald Balding M.D.   On: 03/31/2015 01:49   Dg Fluoro Guided Needle Plc Aspiration/injection Loc  03/30/2015  CLINICAL DATA:  Prior MRI suspicious for septic glenohumeral joint. Sepsis. EXAM: FLUORO GUIDED NEEDLE PLACEMENT FLUOROSCOPY TIME:  If the device does not provide the exposure index: Fluoroscopy Time (in minutes and seconds):  1 minutes and 42 seconds Number of Acquired Images:  None COMPARISON:  MRI of 03/29/2015. FINDINGS: Informed written and verbal consent were obtained. Risks and benefits of the procedure were discussed. A "Time out" was performed. The superior medial portion of the right humeral head was localized under fluoroscopic guidance. Skin was prepped and draped in standard sterile fashion. Skin was numbed with 1% lidocaine. A 20 gauge needle was inserted into the joint. No fluid could be aspirated. Intra-articular location was confirmed with approximately 6 cc of Omnipaque 180. Subsequent, approximately 5 cc of saline were injected. Only minimal fluid  could be returned. IMPRESSION: Uncomplicated aspiration of the right shoulder, as detailed above. Minimal fluid was obtained, and sent to laboratory as requested. Electronically Signed   By: Abigail Miyamoto M.D.   On: 03/30/2015 12:11    Scheduled Meds: .  ceFAZolin (ANCEF) IV  2 g Intravenous Q6H  . docusate sodium  100 mg Oral BID  . insulin aspart  0-9 Units Subcutaneous TID WC  . insulin aspart  3 Units Subcutaneous TID WC  . metoprolol tartrate  25 mg Oral BID  . midazolam  2 mg Intravenous Once  . pencillin G potassium IV  4 Million Units Intravenous Q4H   Continuous Infusions: . 0.45 % NaCl with KCl 20 mEq / L 20 mL (03/31/15 0153)  . sodium chloride 20 mL/hr at 03/31/15 0800  . lactated ringers 10 mL/hr at 03/30/15 1543   Antibiotics Given (last 72 hours)    Date/Time Action Medication Dose Rate   03/29/15 1156 Given   DAPTOmycin (CUBICIN) 480 mg in sodium chloride 0.9 % IVPB 480 mg 219.2 mL/hr   03/29/15 1630 Given   penicillin G potassium 4 Million Units in dextrose 5 % 250 mL IVPB 4 Million Units 250 mL/hr   03/29/15 2046 Given   penicillin G potassium 4 Million Units in dextrose 5 % 250 mL IVPB 4 Million Units 250 mL/hr   03/30/15 0029 Given   penicillin G potassium 4 Million  Units in dextrose 5 % 250 mL IVPB 4 Million Units 250 mL/hr   03/30/15 [id number] Given   penicillin G potassium 4 Million Units in dextrose 5 % 250 mL IVPB 4 Million Units 250 mL/hr   03/30/15 0824 Given   penicillin G potassium 4 Million Units in dextrose 5 % 250 mL IVPB 4 Million Units 250 mL/hr   03/30/15 1206 Given   penicillin G potassium 4 Million Units in dextrose 5 % 250 mL IVPB 4 Million Units 250 mL/hr   03/30/15 1900 Given   ceFAZolin (ANCEF) IVPB 2 g/50 mL premix 2 g    03/30/15 2226 Given   penicillin G potassium 4 Million Units in dextrose 5 % 250 mL IVPB 4 Million Units 250 mL/hr   03/31/15 0022 Given   ceFAZolin (ANCEF) IVPB 2 g/50 mL premix 2 g 100 mL/hr   03/31/15 0223 Given    penicillin G potassium 4 Million Units in dextrose 5 % 250 mL IVPB 4 Million Units 250 mL/hr   03/31/15 0524 Given   ceFAZolin (ANCEF) IVPB 2 g/50 mL premix 2 g 100 mL/hr   03/31/15 [id number] Given   penicillin G potassium 4 Million Units in dextrose 5 % 250 mL IVPB 4 Million Units 250 mL/hr      Principal Problem:   Streptococcal bacteremia Active Problems:   Acute renal failure (ARF) (HCC)   Abdominal pain, vomiting, and diarrhea   Posterior cervical adenopathy   Leukocytosis   Diabetes mellitus (HCC)   Hypertension   Polyarthralgia   NSVT (nonsustained ventricular tachycardia) (HCC)   Sinus tachycardia (HCC)   Neck pain on left side   Arthritis   Pleural effusion right with thoracentesis 1/18   Acute respiratory failure with hypoxia (HCC)   Confusion, postoperative    Time spent: 59min    Kayden Hutmacher  [facility] Pager [phone]. If 7PM-7AM, please contact night-coverage at [email or web address], password [facility] 03/31/2015, 10:27 AM  LOS: 4 days

## 2015-03-31 NOTE — Evaluation (Signed)
Physical Therapy Evaluation Patient Details Name: [name] MRN: [id number] DOB: May 25, 1965 Today's Date: 03/31/2015   History of Present Illness  49/F with DM, HTN admitted with sepsis, AKI, found to have GAS bacteremia, R shoulder Septic arthritis, NSVT, ID and Cards following with pt with I& D right shoulder.   Clinical Impression  Pt admitted with above diagnosis. Pt currently with functional limitations due to the deficits listed below (see PT Problem List). Pt was able to stand and pivot to recliner with steadying assist.  Pt did not want to do more than this due to pain in right shoulder.  Pt should progress and be able to go home but may need some help initially.  Pt will benefit from skilled PT to increase their independence and safety with mobility to allow discharge to the venue listed below.      Follow Up Recommendations Home health PT;Supervision - 24 hour initially    Equipment Recommendations  Other (comment) (TBA)    Recommendations for Other Services       Precautions / Restrictions Precautions Precautions: Fall Required Braces or Orthoses: Sling Restrictions Weight Bearing Restrictions: Yes RUE Weight Bearing: Weight bearing as tolerated      Mobility  Bed Mobility Overal bed mobility: Needs Assistance Bed Mobility: Rolling;Sidelying to Sit Rolling: Min assist Sidelying to sit: Mod assist       General bed mobility comments: Needed assist to elevate trunk due to right shoulder immobility.    Transfers Overall transfer level: Needs assistance Equipment used: 1 person hand held assist Transfers: Sit to/from [facility] Sit to Stand: Min assist Stand pivot transfers: Min assist       General transfer comment: Pt needed assist to power up with pretty good use of LEs and was able to take pivotal steps aroudn to chair but did need steadying assist.   Ambulation/Gait                Stairs            Wheelchair  Mobility    Modified Rankin (Stroke Patients Only)       Balance Overall balance assessment: Needs assistance Sitting-balance support: No upper extremity supported;Feet supported Sitting balance-Leahy Scale: Fair     Standing balance support: Single extremity supported;During functional activity Standing balance-Leahy Scale: Poor Standing balance comment: requiring UE support for balance in standing.                             Pertinent Vitals/Pain Pain Assessment: 0-10 Pain Score: 7  Pain Location: right shoulder Pain Descriptors / Indicators: Aching Pain Intervention(s): Monitored during session;Limited activity within patient's tolerance;Premedicated before session;Repositioned  VSS    Home Living Family/patient expects to be discharged to:: Private residence Living Arrangements: Spouse/significant other;Children Available Help at Discharge: Family;Available PRN/intermittently (husband and twin boys -44 years old) Type of Home: House Home Access: Stairs to enter Entrance Stairs-Rails: Right Entrance Stairs-Number of Steps: 3 Home Layout: Two level;Able to live on main level with bedroom/bathroom Home Equipment: None Additional Comments: Pt worked full time at [employer].  Husband is physician in [place] and children go to school.      Prior Function Level of Independence: Independent               Hand Dominance        Extremity/Trunk Assessment   Upper Extremity Assessment: Defer to OT evaluation  Lower Extremity Assessment: Generalized weakness      Cervical / Trunk Assessment: Normal  Communication   Communication: No difficulties  Cognition Arousal/Alertness: Awake/alert Behavior During Therapy: Flat affect Overall Cognitive Status: Within Functional Limits for tasks assessed                      General Comments      Exercises        Assessment/Plan    PT Assessment Patient needs continued PT  services  PT Diagnosis Generalized weakness;Acute pain   PT Problem List Decreased activity tolerance;Decreased balance;Decreased mobility;Decreased knowledge of use of DME;Decreased safety awareness;Decreased knowledge of precautions;Pain  PT Treatment Interventions DME instruction;Gait training;Functional mobility training;Therapeutic activities;Therapeutic exercise;Balance training;Stair training;Patient/family education   PT Goals (Current goals can be found in the Care Plan section) Acute Rehab PT Goals Patient Stated Goal: to get better PT Goal Formulation: With patient Time For Goal Achievement: 04/14/15 Potential to Achieve Goals: Good    Frequency Min 3X/week   Barriers to discharge Decreased caregiver support will need to be I when she goes home    Co-evaluation               End of Session Equipment Utilized During Treatment: Gait belt Activity Tolerance: Patient limited by fatigue Patient left: in chair;with call bell/phone within reach;with family/visitor present Nurse Communication: Mobility status         Time: [id number] PT Time Calculation (min) (ACUTE ONLY): 21 min   Charges:   PT Evaluation $PT Eval Moderate Complexity: 1 Procedure     PT G CodesDenice Paradise April 21, 2015, 2:25 PM Thresa Dozier [facility] Acute Rehabilitation [phone] (pager)

## 2015-03-31 NOTE — Progress Notes (Signed)
Pt achieved 550-659ml on the IS. Good patient effort no complications noted. Pt take shallow deep breaths no pain noted when asked. Pt pao2 is high oxygen was titrated.

## 2015-03-31 NOTE — Progress Notes (Signed)
Asked to see patient by cross-coverage hospitalist Kirby-Graham re: new pleural effusion.  Briefly, patient is a previously healthy [name] who presented four days ago with AKI and sepsis which turned out to be from GAS bacteremia.  AKI resolved and blood cultures normal.  She appears to have a cervical adenitis as the primary source, and also to be complicated by R shoulder septic arthritis, debrided in the OR today.  After surgery tonight, the patient was initially somewhat disoriented and tachypneic.  An ABG showed normal pH and mildly low pCO2.  A CXR showed a new pleural effusion on the R.  BP 170/86 mmHg  Pulse 103  Temp(Src) 100 [name] (37.8 C) (Oral)  Resp 35  Ht 5\' 6"  (1.676 m)  Wt 88.1 kg (194 lb 3.6 oz)  BMI 31.36 kg/m2  SpO2 100%  Alert and oriented.  On NRB and tachypneic, but mentating and conversing normally.  Lungs diminished markedly at R base.  Clear on L.  Tachycardic.  Pulses good.  Skin warm.   CBC shows Hgb slowly trending down to 7.9 g/dL from admission 11.4 and 8.2 g/dL earlier today.  Leukocytosis steady since admission.  Tachycardia unchanged.  The patient has a new pleural effusion.  At this point in time, the patient is hemodynamically stable, her respiratory status appears stable, and she is on appropriate antibiotics.  Recommend thoracentesis, diagnostic and therapeutic.  Family have asked instead for transfer to [facility].    Will work on transfer arrangements, in the interim, have ordered thoracentesis.

## 2015-03-31 NOTE — Progress Notes (Addendum)
Shift event: RN, Charito, paged this NP as pt was somewhat confused upon return from surgery. Satting well on RA but slight increase in respiratory effort. Is s/p I&D this pm for septic shoulder. She was also admitted with AKI and sepsis/bacteremia. ID is following her and she is on long-term PCN.  S: pt denies SOB or CP. Husband is at bedside and is apparently a nephrologist out of state. He is asking for CT head and for pt to possibly be transferred to a higher level of care. Tests pending.  O: Appears well, sleepy but easily awaken. Oriented to place, person, and why she is at hospital. Not oriented to time.  Her speech is clear and there are no noted focal neuro deficits. Slightly tachypneic but not working hard. O2 sat normal on RA, but placed A/P: 1. Confusion-given she is just back from surgery and had narcotics, etc, this doesn't seem too out of the ordinary. Because of the increased respiratory effort, an ABG is pending. Will get some stat labs including CBC, CMP and ammonia. Discussed issues with husband who is suggesting a CT head. This NP is not sure that is warranted at this time, but will see what tests show. He is also questioning the need to move to a higher level of care, but the pt is completely stable except for disorientation to time. Will give a dose of Narcan to see if opoid reversal will solve issues. Husband agrees to this.  2. AKI-resolved. Creat back to normal.  3. Sepsis/septic shoulder/bacteremia-followed by ID. Could definitely be a contributing factor to mild confusion.  Will follow.  KJKG, NP [facility] Update: Back to check on pt after NRB placed. She is more alert and knows the year now. O2 sats normal. Diminished breath sounds in Right lower lobe with soft crackles. No wheezing. However, she is still breathing rapidly and CXR showed pleural effusion. Lasix 60mg  IV x 1 given. IVF to [facility]  Pt TF to 2H for Bipap. Recheck ABG in 1-1.5 hours. CBC shows decreased WBCC  and slightly lowered Hgb at 7.9 but could be dilutional from previous IVF. Recheck later. Ammonia slightly elevated but no hx liver disease and LFTs are normal. CMP essentially same and creat stable at .95. Plan discussed with husband who is in agreement. This NP listened to husband and made sure he was OK with plan and he made suggestions at times also.  Since we have identified a source for confusion and it is improved, will defer CT head for now.  Will follow. BP and HR elevated. Metoprolol 5mg  IV now.  KJKG, NP [facility] Addendum: Upon arrival in SDU, pt is alert and oriented x 4 and no longer tachypneic. Therefore, will not progress to Bipap at this time. RT will leave NRB in place and recheck ABG in one hour.  KJKG, NP [facility] Addendum: Pt remains stable. BP and HR are improved after Lasix and metoprolol IV. ABG is due to be repeated and RT is aware. Per RN, husband was asking to speak with me again. Per RN, husband wanted to know why pt was NPO. I explained to the RN that the order was still in the computer for the pt to have TEE and this NP didn't know the distinct plan, so pt would need to remain NPO til decision made in the am about test. This NP then spoke to husband over the phone. He is demanding that we "jump on this now...stating, we need to move on this" "Are  we going to tap the effusion and is there someone to do that tonight what if it is an empyema" I explained to him that there was no evidence of empyema or comment about this by the radiologist. Explained that if we need to get a CT, we will do that in the am. Explained that if attending in am feels it is necessary, then pulmonary will be consulted. At that point, he insisted on speaking with a physician. This NP discussed case with [facility] Dr. Loleta Books who will go to bedside and speak with husband.  KJKG, NP [facility] Update: Dr. Loleta Books spoke to pt's husband at bedside and husband requested TF to [facility]. Dr. Loleta Books went ahead and ordered IR  thoracentesis for this am.  TF process was started by this NP. Spoke to [employer] in [employer] at [employer]. More info is needed on their part to determine level of care at [facility] and this will designate which TF MD we will talk to about actual TF. We are working on that f/up info now. Demographic sheet was faxed to [facility] confirmed receipt of such.  ABG after NRB for about an hour showed a nl pH, nl. PCO2 and PO2 of 373. Pt is presently being weaned to [facility]. After we get her weaned will r/p another ABG (required by [facility]) and call TF center back.  At present, pt is completely alert and oriented on 4L of O2. RLL is still somewhat diminished but air exchange is better after the Lasix. So far, pt has voided 500cc after Lasix 60mg . Metoprolol was given again for slightly elevated BP. BP now in the 140 range. HR less than 100.  Husband and pt updated on situation with TF to [facility]. Informed husband of the required process for TF and info needed by [facility] to decide level of care bed to assign. After level of care established, a [facility] MD will call for discussion. Informed husband that it could be hours before transfer occurs because as per last conversation with [facility], there are no beds available.  Will follow.Gave sign out to oncoming attending at 0700.  KJKG, NP [facility] Update: ABG on 3L just drawn. Asked RN to call to attending. Pt had further output after Lasix of 750cc for a total of 1250cc since Lasix. NP called [facility] TF center again and spoke to Shanon Brow who is aware of this situation. Last info needed is the ABG results on the 3L and Dr. To Dr. Loetta Rough call can take place.  KJKG, NP [facility]

## 2015-03-31 NOTE — Progress Notes (Signed)
Report given to [facility] RN. 01:45 Patient transferred to [facility] room 20.Patient's belongings brought with pt.,husband with patient. Ambur Province, Wonda Cheng, Therapist, sports

## 2015-03-31 NOTE — Progress Notes (Signed)
Notified md of pt's abg results.  Will continue to monitor .  Saunders Revel T

## 2015-03-31 NOTE — Progress Notes (Signed)
OT Cancellation Note  Patient Details Name: [name] MRN: [id number] DOB: Jan 17, 1966   Cancelled Treatment:    Reason Eval/Treat Not Completed: Patient at procedure or test/ unavailable (at thoracentesis)  Vonita Moss   OTR/L Pager: [phone] Office: [phone] .  03/31/2015, 9:50 AM

## 2015-03-31 NOTE — Progress Notes (Signed)
Patient ID: [name], female   DOB: 1965-10-23, 50 y.o.   MRN: [id number]    [place] R Thoracentesis was requested  Limited US Chest was performed Very small sliver of fluid was seen on Rt  No safe window to access small amount No procedure performed  Pt and family aware and agreeable

## 2015-03-31 NOTE — Consult Note (Signed)
Name: [name] MRN: [id number] DOB: April 24, 1965    ADMISSION DATE:  03/27/2015 CONSULTATION DATE:  1/18  REFERRING MD :  [facility]  CHIEF COMPLAINT:  Post op confusion  BRIEF PATIENT DESCRIPTION: 50 yo post shoulder I&D  SIGNIFICANT EVENTS  1/18 attempted rt thora but not enough fluid  STUDIES:     HISTORY OF PRESENT ILLNESS:   50 yo AAF who had known right shoulder abscess and proved refractory to opt abx and was underwent I&D right shoulder per Dr. Percell Miller on 1/17 1945 hrs. She is a never smoker and no history of lung dz. Post operative she had an episode of confusion and tachypnea without hypoxia. She was moved to SDU at family insistence. She has fully recovered as to confusion and tachypnea but and incidental finding of small right pleural effusion was noted on CxR. She was sent to IR for thoracentesis but there was not enough fluid to tap. PCCM evaluated her and no further invasive interventions are required at this time.   PAST MEDICAL HISTORY :   has a past medical history of Hypertension and Diabetes mellitus without complication ([place]).  has past surgical history that includes Breast surgery; Uterine fibroid surgery; and Left oophorectomy (Left). Prior to Admission medications   Medication Sig Start Date End Date Taking? Authorizing Provider  acetaminophen (TYLENOL) 325 MG tablet Take 325 mg by mouth every 6 (six) hours as needed for mild pain or moderate pain.   Yes Historical Provider, MD  hydrochlorothiazide (HYDRODIURIL) 25 MG tablet Take 25 mg by mouth daily. 03/08/15  Yes Historical Provider, MD  losartan (COZAAR) 100 MG tablet Take 100 mg by mouth daily. 03/08/15  Yes Historical Provider, MD  metFORMIN (GLUCOPHAGE) 500 MG tablet Take 500 mg by mouth 2 (two) times daily. 03/08/15  Yes Historical Provider, MD  naproxen sodium (ANAPROX) 220 MG tablet Take 220 mg by mouth 2 (two) times daily as needed (pain).   Yes Historical Provider, MD   No Known  Allergies  FAMILY HISTORY:  family history is not on file. SOCIAL HISTORY:  reports that she has never smoked. She does not have any smokeless tobacco history on file. She reports that she does not drink alcohol or use illicit drugs.  REVIEW OF SYSTEMS:   10 point review of system taken, please see HPI for positives and negatives.  SUBJECTIVE:   VITAL SIGNS: Temp:  [97.5 F (36.4 C)-100 F (37.8 C)] 99.7 F (37.6 C) (01/18 0737) Pulse Rate:  [89-121] 112 (01/18 0839) Resp:  [14-41] 22 (01/18 0839) BP: (113-170)/(71-118) 149/89 mmHg (01/18 0839) SpO2:  [90 %-100 %] 100 % (01/18 0839) Weight:  [194 lb 3.6 oz (88.1 kg)] 194 lb 3.6 oz (88.1 kg) (01/17 2148)  PHYSICAL EXAMINATION: General:  [id number] @ rest Neuro: Intact HEENT: No JVD/LAN Cardiovascular:  HSR RRR Lungs: CTA, decreased bs bases Abdomen:  Soft + bs Musculoskeletal: Rt shoulder dressing intact Skin:  warm   Recent Labs Lab 03/29/15 0624 03/30/15 0715 03/31/15 0105  NA 134* 131* 133*  K 3.4* 3.8 3.8  CL 106 100* 102  CO2 20* 21* 21*  BUN 19 15 16   CREATININE 1.08* 0.84 0.95  GLUCOSE 198* 179* 196*    Recent Labs Lab 03/29/15 0624 03/30/15 0715 03/31/15 0105  HGB 9.3* 8.2* 7.9*  HCT 27.9* 24.4* 23.6*  WBC 17.0* 22.5* 21.2*  PLT 168 193 177   Mr Shoulder Right Wo Contrast  03/30/2015  CLINICAL DATA:  Joint pain most severe in the  right shoulder. Sepsis. EXAM: MRI OF THE RIGHT SHOULDER WITHOUT CONTRAST TECHNIQUE: Multiplanar, multisequence MR imaging of the shoulder was performed. No intravenous contrast was administered. COMPARISON:  03/27/2015 FINDINGS: Rotator cuff: Prominent partial thickness articular surface tearing of the supraspinatus, with the articular surface portion retracted 1.7 cm on image 16 series 5, compatible with tendon delamination. Moderate tendinopathy of the remaining supraspinatus tendon. Similarly there is partial thickness articular surface tearing of the infraspinatus tendon  with moderate infraspinatus tendinopathy. Moderate subscapularis tendinopathy is present along with potential partial thickness articular surface tearing Muscles: Abnormal edema tracks primarily along the marginal portions of the supraspinatus, subscapularis, teres minor, and teres major muscles. There is also low-level edema along the superficial fascia margin of the lateral deltoid. Biceps long head: Partial tearing or advanced tendinopathy of the intra-articular segment. Acromioclavicular Joint: Mild degenerative AC joint arthropathy. Subacromial morphology is type 2 (curved). Small but abnormal amount of fluid in the subacromial subdeltoid bursa. Glenohumeral Joint: Prominent glenohumeral joint effusion. Moderate degenerative chondral thinning along the glenohumeral joint. Labrum: Linear irregularity in the superior labrum with a blunted appearance, compatible with SLAP tear extending into the biceps anchor. Bones: Unusual flaring of the proximal humeral metadiaphysis, query prior fracture. Small right axillary lymph nodes are observed. IMPRESSION: 1. Large shoulder joint effusion with abnormal edema tracking within and along the margins of the regional musculature. In the setting of sepsis and new onset shoulder pain, there is a high degree of concern for septic glenohumeral joint, and arthrocentesis is likely indicated. 2. Partial thickness articular surface tearing of the supraspinatus and infraspinatus with tendon delamination. There is potentially mild partial thickness articular surface tearing of the subscapularis. Considerable tendinopathy of these tendons as well. 3. Advanced tendinopathy or partial tearing of the long head of the biceps. 4. Increased signal in the superior labrum compatible with SLAP tear extending into the biceps anchor. 5. Subacromial subdeltoid bursitis. 6. Somewhat unusual flaring of the proximal humeral metadiaphysis, query prior fracture. 7. Moderate degenerative glenohumeral  arthropathy. These results will be called to the ordering clinician or representative by the Radiologist Assistant, and communication documented in the PACS or zVision Dashboard. Electronically Signed   By: Van Clines M.D.   On: 03/30/2015 07:28   Dg Chest Port 1 View  03/31/2015  CLINICAL DATA:  Acute onset of shortness of breath. Initial encounter. EXAM: PORTABLE CHEST 1 VIEW COMPARISON:  Chest radiograph from 03/27/2015 FINDINGS: A small to moderate right-sided pleural effusion is noted. Right basilar airspace opacity may reflect atelectasis or pneumonia. Asymmetric interstitial edema could have a similar appearance. Underlying vascular congestion is seen. No pneumothorax is identified. The cardiomediastinal silhouette is borderline enlarged. No acute osseous abnormalities are seen. IMPRESSION: 1. Small to moderate right-sided pleural effusion noted. Right basilar airspace opacity may reflect atelectasis or pneumonia. Asymmetric interstitial edema could have a similar appearance. 2. Underlying vascular congestion and borderline cardiomegaly. Electronically Signed   By: Garald Balding M.D.   On: 03/31/2015 01:49   Dg Fluoro Guided Needle Plc Aspiration/injection Loc  03/30/2015  CLINICAL DATA:  Prior MRI suspicious for septic glenohumeral joint. Sepsis. EXAM: FLUORO GUIDED NEEDLE PLACEMENT FLUOROSCOPY TIME:  If the device does not provide the exposure index: Fluoroscopy Time (in minutes and seconds):  1 minutes and 42 seconds Number of Acquired Images:  None COMPARISON:  MRI of 03/29/2015. FINDINGS: Informed written and verbal consent were obtained. Risks and benefits of the procedure were discussed. A "Time out" was performed. The superior medial portion of the  right humeral head was localized under fluoroscopic guidance. Skin was prepped and draped in standard sterile fashion. Skin was numbed with 1% lidocaine. A 20 gauge needle was inserted into the joint. No fluid could be aspirated.  Intra-articular location was confirmed with approximately 6 cc of Omnipaque 180. Subsequent, approximately 5 cc of saline were injected. Only minimal fluid could be returned. IMPRESSION: Uncomplicated aspiration of the right shoulder, as detailed above. Minimal fluid was obtained, and sent to laboratory as requested. Electronically Signed   By: Abigail Miyamoto M.D.   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 Percell Miller on 1/17 1945 hrs. She is a never smoker and no history of lung dz. Post operative she had an episode of confusion and tachypnea without hypoxia. She was moved to SDU at family insistence. She has fully recovered as to confusion and tachypnea but and incidental finding of small right pleural effusion was noted on CxR. She was sent to IR for thoracentesis but there was not enough fluid to tap. PCCM evaluated her and no further invasive interventions are required at this time.          PLAN:  Wean O2 for sats>92% IS Q1h while awake Consider CT of chest for further delineation of pulmonary process. Stable to transfer to [facility] as husband has requested Serial c x r for clearing of effusion.  Richardson Landry Minor ACNP Maryanna Shape PCCM Pager [phone] till 3 pm If no answer page [phone] 03/31/2015, 10:37 AM

## 2015-03-31 NOTE — Progress Notes (Signed)
Subjective: No CP  Breating is fair   Objective: Filed Vitals:   03/31/15 1546 03/31/15 1601 03/31/15 1614 03/31/15 1704  BP:  164/96 154/95 180/99  Pulse: 114 114 115 116  Temp: 100.1 F (37.8 C) 100.7 F (38.2 C) 100.1 F (37.8 C) 100.3 F (37.9 C)  TempSrc: Oral   Oral  Resp: 35 37 37 32  Height:      Weight:      SpO2:  98% 97% 96%   Weight change: 0.6 kg (1 lb 5.2 oz)  Intake/Output Summary (Last 24 hours) at 03/31/15 1744 Last data filed at 03/31/15 1546  Gross per 24 hour  Intake   3379 ml  Output   1850 ml  Net   1529 ml    General: Alert, awake, oriented x3, in no acute distress Neck:  JVP is normal Heart: Regular rate and rhythm, without murmurs, rubs, gallops.  Lungs: Clear to auscultation.  No rales or wheezes. Exemities:  R shoulder with bandage  L wrist swollen   Neuro: Grossly intact, nonfocal.  Tele  ST   Lab Results: Results for orders placed or performed during the hospital encounter of 03/27/15 (from the past 24 hour(s))  Anaerobic culture     Status: None (Preliminary result)   Collection Time: 03/30/15  7:42 PM  Result Value Ref Range   Specimen Description SYNOVIAL RIGHT SHOULDER    Special Requests PATIENT ON FOLLOWING PENICILLIN    Gram Stain      ABUNDANT WBC PRESENT, PREDOMINANTLY PMN NO ORGANISMS SEEN    Culture      NO ANAEROBES ISOLATED; CULTURE IN PROGRESS FOR 5 DAYS   Report Status PENDING   Body fluid culture     Status: None (Preliminary result)   Collection Time: 03/30/15  7:43 PM  Result Value Ref Range   Specimen Description SYNOVIAL RIGHT SHOULDER    Special Requests PATIENT ON FOLLOWING PENICILLIN    Gram Stain      ABUNDANT WBC PRESENT, PREDOMINANTLY PMN NO ORGANISMS SEEN    Culture NO GROWTH < 12 HOURS    Report Status PENDING   Synovial cell count + diff, w/ crystals     Status: Abnormal   Collection Time: 03/30/15  7:44 PM  Result Value Ref Range   Color, Synovial YELLOW (A) YELLOW   Appearance-Synovial  TURBID (A) CLEAR   Crystals, Fluid NO CRYSTALS SEEN    WBC, Synovial 79750 (H) 0 - 200 /cu mm   Neutrophil, Synovial 95 (H) 0 - 25 %   Lymphocytes-Synovial Fld 0 0 - 20 %   Monocyte-Macrophage-Synovial Fluid 5 (L) 50 - 90 %   Eosinophils-Synovial 0 0 - 1 %  Glucose, capillary     Status: Abnormal   Collection Time: 03/30/15  8:04 PM  Result Value Ref Range   Glucose-Capillary 125 (H) 65 - 99 mg/dL  Glucose, capillary     Status: Abnormal   Collection Time: 03/30/15  9:45 PM  Result Value Ref Range   Glucose-Capillary 160 (H) 65 - 99 mg/dL  Blood gas, arterial     Status: Abnormal   Collection Time: 03/31/15 12:35 AM  Result Value Ref Range   FIO2 NASAL CANNULA    O2 Content 2.0 L/min   pH, Arterial 7.428 7.350 - 7.450   pCO2 arterial 30.1 (L) 35.0 - 45.0 mmHg   pO2, Arterial 60.5 (L) 80.0 - 100.0 mmHg   Bicarbonate 19.5 (L) 20.0 - 24.0 mEq/L   TCO2 20.4 0 -  100 mmol/L   Acid-base deficit 4.1 (H) 0.0 - 2.0 mmol/L   O2 Saturation 89.8 %   Patient temperature 98.6    Collection site RIGHT RADIAL    Drawn by [id number]    Sample type ARTERIAL    Allens test (pass/fail) PASS PASS  CBC     Status: Abnormal   Collection Time: 03/31/15  1:05 AM  Result Value Ref Range   WBC 21.2 (H) 4.0 - 10.5 K/uL   RBC 2.91 (L) 3.87 - 5.11 MIL/uL   Hemoglobin 7.9 (L) 12.0 - 15.0 g/dL   HCT 23.6 (L) 36.0 - 46.0 %   MCV 81.1 78.0 - 100.0 fL   MCH 27.1 26.0 - 34.0 pg   MCHC 33.5 30.0 - 36.0 g/dL   RDW 13.9 11.5 - 15.5 %   Platelets 177 150 - 400 K/uL  Comprehensive metabolic panel     Status: Abnormal   Collection Time: 03/31/15  1:05 AM  Result Value Ref Range   Sodium 133 (L) 135 - 145 mmol/L   Potassium 3.8 3.5 - 5.1 mmol/L   Chloride 102 101 - 111 mmol/L   CO2 21 (L) 22 - 32 mmol/L   Glucose, Bld 196 (H) 65 - 99 mg/dL   BUN 16 6 - 20 mg/dL   Creatinine, Ser 0.95 0.44 - 1.00 mg/dL   Calcium 8.5 (L) 8.9 - 10.3 mg/dL   Total Protein 6.0 (L) 6.5 - 8.1 g/dL   Albumin 1.7 (L) 3.5 - 5.0 g/dL     AST 36 15 - 41 U/L   ALT 22 14 - 54 U/L   Alkaline Phosphatase 114 38 - 126 U/L   Total Bilirubin 1.2 0.3 - 1.2 mg/dL   GFR calc non Af Amer >60 >60 mL/min   GFR calc Af Amer >60 >60 mL/min   Anion gap 10 5 - 15  Ammonia     Status: Abnormal   Collection Time: 03/31/15  1:05 AM  Result Value Ref Range   Ammonia 49 (H) 9 - 35 umol/L  MRSA PCR Screening     Status: None   Collection Time: 03/31/15  1:55 AM  Result Value Ref Range   MRSA by PCR NEGATIVE NEGATIVE  I-STAT 3, arterial blood gas (G3+)     Status: Abnormal   Collection Time: 03/31/15  4:39 AM  Result Value Ref Range   pH, Arterial 7.476 (H) 7.350 - 7.450   pCO2 arterial 35.3 35.0 - 45.0 mmHg   pO2, Arterial 378.0 (H) 80.0 - 100.0 mmHg   Bicarbonate 25.9 (H) 20.0 - 24.0 mEq/L   TCO2 27 0 - 100 mmol/L   O2 Saturation 100.0 %   Acid-Base Excess 3.0 (H) 0.0 - 2.0 mmol/L   Patient temperature 37.8 C    Sample type ARTERIAL   Blood gas, arterial     Status: Abnormal   Collection Time: 03/31/15  6:55 AM  Result Value Ref Range   O2 Content 3.0 L/min   Delivery systems NO CHARGE    pH, Arterial 7.471 (H) 7.350 - 7.450   pCO2 arterial 29.7 (L) 35.0 - 45.0 mmHg   pO2, Arterial 102 (H) 80.0 - 100.0 mmHg   Bicarbonate 21.4 20.0 - 24.0 mEq/L   TCO2 22.3 0 - 100 mmol/L   Acid-base deficit 1.7 0.0 - 2.0 mmol/L   O2 Saturation 98.0 %   Patient temperature 98.6    Collection site LEFT RADIAL    Drawn by [id number]    Sample  type ARTERIAL    Allens test (pass/fail) PASS PASS  Glucose, capillary     Status: Abnormal   Collection Time: 03/31/15  7:34 AM  Result Value Ref Range   Glucose-Capillary 196 (H) 65 - 99 mg/dL  Type and screen [place]     Status: None (Preliminary result)   Collection Time: 03/31/15 11:21 AM  Result Value Ref Range   ABO/RH(D) O POS    Antibody Screen NEG    Sample Expiration 04/03/2015    Unit Number [id number]    Blood Component Type RED CELLS,LR    Unit division 00     Status of Unit ISSUED    Transfusion Status OK TO TRANSFUSE    Crossmatch Result Compatible   Prepare RBC     Status: None   Collection Time: 03/31/15 11:21 AM  Result Value Ref Range   Order Confirmation ORDER PROCESSED BY BLOOD BANK   ABO/Rh     Status: None   Collection Time: 03/31/15 11:21 AM  Result Value Ref Range   ABO/RH(D) O POS     Studies/Results: [place] Chest  03/31/2015  CLINICAL DATA:  50 year old female with chest x-ray demonstrating right-sided small pleural effusion. EXAM: CHEST ULTRASOUND COMPARISON:  None. FINDINGS: Ultrasound survey of the right chest demonstrating small complex pleural effusion, not amenable to ultrasound-guided drainage. IMPRESSION: Small, complex appearing right pleural effusion not amenable for ultrasound-guided thoracentesis. Signed, Dulcy Fanny. Earleen Newport, DO [facility] Radiology Electronically Signed   By: [name] D.O.   On: 03/31/2015 11:05   Dg Chest Port 1 View  03/31/2015  CLINICAL DATA:  Acute onset of shortness of breath. Initial encounter. EXAM: PORTABLE CHEST 1 VIEW COMPARISON:  Chest radiograph from 03/27/2015 FINDINGS: A small to moderate right-sided pleural effusion is noted. Right basilar airspace opacity may reflect atelectasis or pneumonia. Asymmetric interstitial edema could have a similar appearance. Underlying vascular congestion is seen. No pneumothorax is identified. The cardiomediastinal silhouette is borderline enlarged. No acute osseous abnormalities are seen. IMPRESSION: 1. Small to moderate right-sided pleural effusion noted. Right basilar airspace opacity may reflect atelectasis or pneumonia. Asymmetric interstitial edema could have a similar appearance. 2. Underlying vascular congestion and borderline cardiomegaly. Electronically Signed   By: Garald Balding M.D.   On: 03/31/2015 01:49    Medications: REviewed    @PROBHOSP @  1  NSVT  No recurrence  Continue b blocker  Increase  dose   2  HTN  BP higher today  Will increase metoprolol and add low dose amlodipine 2.5 bid to regimen  Follow  (Pt was on losartan 100 and HCTZ prior to admit, for past 7 to 8 years)  3.  ID  Events of yesterday noted  Pulm has been contacted.    LOS: 4 days   Dorris Carnes 03/31/2015, 5:44 PM

## 2015-03-31 NOTE — Progress Notes (Signed)
03/30/15 2340 Patient still confused,respirations up to 34 breaths/min,patient getting restless,wanting to go home,will not keep Oxygen on,husband at bedside.Josephine Cables made aware. Order received.Will continue to monitor. Marciel Offenberger, Wonda Cheng, Therapist, sports

## 2015-03-31 NOTE — Progress Notes (Addendum)
[facility] for Infectious Disease   Reason for visit: Follow up on disseminated GAS bacteremia  Interval History: repeat blood cultures remain negative.  Surgery washout yesterday.  Now with pleural effusion.  Afebrile.  WBC at 21.5, stable.   Physical Exam: Constitutional:  Filed Vitals:   03/31/15 0737 03/31/15 0839  BP: 152/87 149/89  Pulse: 109 112  Temp: 99.7 F (37.6 C)   Resp: 22 22   patient appears in NAD Eyes: anicteric HENT: no thrush Respiratory: Normal respiratory effort; CTA B Cardiovascular: RRR GI: soft, nt, nd MS: right wrist with less edema, shoulder warm; knees without effusion Skin no rashes  Review of Systems: Gastrointestinal: negative for diarrhea  Lab Results  Component Value Date   WBC 21.2* 03/31/2015   HGB 7.9* 03/31/2015   HCT 23.6* 03/31/2015   MCV 81.1 03/31/2015   PLT 177 03/31/2015    Lab Results  Component Value Date   CREATININE 0.95 03/31/2015   BUN 16 03/31/2015   NA 133* 03/31/2015   K 3.8 03/31/2015   CL 102 03/31/2015   CO2 21* 03/31/2015    Lab Results  Component Value Date   ALT 22 03/31/2015   AST 36 03/31/2015   ALKPHOS 114 03/31/2015     Microbiology: Recent Results (from the past 240 hour(s))  Urine culture     Status: None   Collection Time: 03/27/15 12:42 PM  Result Value Ref Range Status   Specimen Description URINE, CLEAN CATCH  Final   Special Requests Normal  Final   Culture   Final    >=100,000 COLONIES/mL GROUP A STREP (S.PYOGENES) ISOLATED   Report Status 03/29/2015 FINAL  Final  Culture, blood (Routine X 2) w Reflex to ID Panel     Status: None   Collection Time: 03/27/15  2:25 PM  Result Value Ref Range Status   Specimen Description BLOOD LEFT ANTECUBITAL  Final   Special Requests BOTTLES DRAWN AEROBIC ONLY 5CC  Final   Culture  Setup Time   Final    GRAM POSITIVE COCCI IN PAIRS IN CHAINS AEROBIC BOTTLE ONLY CRITICAL RESULT CALLED TO, READ BACK BY AND VERIFIED WITH: A. MINTZ,RN AT  0738 ON [id number] BY Rhea Bleacher    Culture   Final    GROUP A STREP (S.PYOGENES) ISOLATED SUSCEPTIBILITIES PERFORMED ON PREVIOUS CULTURE WITHIN THE LAST 5 DAYS.    Report Status 03/30/2015 FINAL  Final  Culture, blood (Routine X 2) w Reflex to ID Panel     Status: None   Collection Time: 03/27/15  3:00 PM  Result Value Ref Range Status   Specimen Description BLOOD RIGHT HAND  Final   Special Requests BOTTLES DRAWN AEROBIC AND ANAEROBIC 5CC  Final   Culture  Setup Time   Final    GRAM POSITIVE COCCI IN PAIRS IN CHAINS IN BOTH AEROBIC AND ANAEROBIC BOTTLES CRITICAL RESULT CALLED TO, READ BACK BY AND VERIFIED WITH: A. MINTZ,RN AT 0848 ON [id number] BY Rhea Bleacher    Culture GROUP A STREP (S.PYOGENES) ISOLATED  Final   Report Status 03/30/2015 FINAL  Final   Organism ID, Bacteria GROUP A STREP (S.PYOGENES) ISOLATED  Final      Susceptibility   Group a strep (s.pyogenes) isolated - MIC*    CLINDAMYCIN >=1 RESISTANT Resistant     AMPICILLIN <=0.25 SENSITIVE Sensitive     ERYTHROMYCIN >=8 RESISTANT Resistant     VANCOMYCIN <=0.12 SENSITIVE Sensitive     CEFTRIAXONE <=0.12 SENSITIVE Sensitive  LEVOFLOXACIN <=0.25 SENSITIVE Sensitive     * GROUP A STREP (S.PYOGENES) ISOLATED  Culture, blood (routine x 2)     Status: None (Preliminary result)   Collection Time: 03/28/15  3:00 PM  Result Value Ref Range Status   Specimen Description BLOOD RIGHT HAND  Final   Special Requests BOTTLES DRAWN AEROBIC AND ANAEROBIC 5CC  Final   Culture NO GROWTH 2 DAYS  Final   Report Status PENDING  Incomplete  Culture, blood (routine x 2)     Status: None (Preliminary result)   Collection Time: 03/28/15  3:05 PM  Result Value Ref Range Status   Specimen Description BLOOD RIGHT HAND  Final   Special Requests BOTTLES DRAWN AEROBIC ONLY 8.5CC  Final   Culture NO GROWTH 2 DAYS  Final   Report Status PENDING  Incomplete  Gram stain     Status: None   Collection Time: 03/30/15 10:52 AM  Result Value Ref Range  Status   Specimen Description FLUID SYNOVIAL RIGHT SHOULDER  Final   Special Requests NONE  Final   Gram Stain   Final    MODERATE WBC PRESENT, PREDOMINANTLY PMN FEW GRAM POSITIVE COCCI IN PAIRS IN CHAINS    Report Status 03/30/2015 FINAL  Final  Fungus Culture with Smear     Status: None (Preliminary result)   Collection Time: 03/30/15 10:52 AM  Result Value Ref Range Status   Specimen Description FLUID SYNOVIAL RIGHT SHOULDER  Final   Special Requests NONE  Final   Fungal Smear   Final    NO YEAST OR FUNGAL ELEMENTS SEEN Performed at [facility]    Culture   Final    CULTURE IN PROGRESS FOR FOUR WEEKS Performed at [facility]    Report Status PENDING  Incomplete  Anaerobic culture     Status: None (Preliminary result)   Collection Time: 03/30/15  7:42 PM  Result Value Ref Range Status   Specimen Description SYNOVIAL RIGHT SHOULDER  Final   Special Requests PATIENT ON FOLLOWING PENICILLIN  Final   Gram Stain   Final    ABUNDANT WBC PRESENT, PREDOMINANTLY PMN NO ORGANISMS SEEN    Culture   Final    NO ANAEROBES ISOLATED; CULTURE IN PROGRESS FOR 5 DAYS   Report Status PENDING  Incomplete  Body fluid culture     Status: None (Preliminary result)   Collection Time: 03/30/15  7:43 PM  Result Value Ref Range Status   Specimen Description SYNOVIAL RIGHT SHOULDER  Final   Special Requests PATIENT ON FOLLOWING PENICILLIN  Final   Gram Stain   Final    ABUNDANT WBC PRESENT, PREDOMINANTLY PMN NO ORGANISMS SEEN    Culture NO GROWTH < 12 HOURS  Final   Report Status PENDING  Incomplete  MRSA PCR Screening     Status: None   Collection Time: 03/31/15  1:55 AM  Result Value Ref Range Status   MRSA by PCR NEGATIVE NEGATIVE Final    Comment:        The GeneXpert MRSA Assay (FDA approved for NASAL specimens only), is one component of a comprehensive MRSA colonization surveillance program. It is not intended to diagnose MRSA infection nor to guide or monitor  treatment for MRSA infections.     Impression/Plan:  1. GAS bacteremia - source seems to be adenitis.  On penicillin. 2. Septic arthritis - c/w GAS on gram stain.  Will need prolonged IV penicillin at discharge.  Washout done.  3. Low albumin -  was a little low on admission, worse now. May need supplements after surgery if she is not eating. 4.  Mild transaminitis - normal on admission.  Will monitor.  5. Pleural effusion - likely related to bacteremia.

## 2015-03-31 NOTE — Progress Notes (Signed)
     Subjective:  POD#1 ID of R shoulder. Patient reports pain as moderate.  Resting comfortably in the bed this morning.  CXR last night revealed new R pleural effusion.  ABG abnormal.  Respiratory status has been stable.  Recommended to have thoracentesis today.     Objective:   VITALS:   Filed Vitals:   03/31/15 0454 03/31/15 0500 03/31/15 0600 03/31/15 0737  BP:  162/82 150/85 152/87  Pulse:  102 108 109  Temp:    99.7 F (37.6 C)  TempSrc:    Oral  Resp:  33 29 22  Height:      Weight:      SpO2: 100% 100% 100% 100%    Neurologically intact ABD soft Neurovascular intact Sensation intact distally Intact pulses distally Incision: dressing C/D/I R arm in sling  Lab Results  Component Value Date   WBC 21.2* 03/31/2015   HGB 7.9* 03/31/2015   HCT 23.6* 03/31/2015   MCV 81.1 03/31/2015   PLT 177 03/31/2015   BMET    Component Value Date/Time   NA 133* 03/31/2015 0105   K 3.8 03/31/2015 0105   CL 102 03/31/2015 0105   CO2 21* 03/31/2015 0105   GLUCOSE 196* 03/31/2015 0105   BUN 16 03/31/2015 0105   CREATININE 0.95 03/31/2015 0105   CALCIUM 8.5* 03/31/2015 0105   GFRNONAA >60 03/31/2015 0105   GFRAA >60 03/31/2015 0105     Assessment/Plan: 1 Day Post-Op   Principal Problem:   Streptococcal bacteremia Active Problems:   Acute renal failure (ARF) (HCC)   Abdominal pain, vomiting, and diarrhea   Posterior cervical adenopathy   Leukocytosis   Diabetes mellitus (HCC)   Hypertension   Polyarthralgia   NSVT (nonsustained ventricular tachycardia) (HCC)   Sinus tachycardia (HCC)   Neck pain on left side   Arthritis   Up with therapy WBAT in the RUE, sling for comfort only Continue abx per ID team   Myranda Pavone Marie 03/31/2015, 8:18 AM Cell [phone]

## 2015-03-31 NOTE — Progress Notes (Signed)
BiPAP at bedside. Patient is being cleaned and settled from transport to new unit. RT in [place] notified.

## 2015-03-31 NOTE — Progress Notes (Signed)
Spoke with NP regarding the use of the BiPAP. Pt respiratory effort is slightly increase, but patient stated that she is not short of breath at the moment. Neurological status is fine she is alert and oriented, pt is able to answer questions and follow commands. Pt was asked to take a deep breath in, pt took a deep breath in and stated that no pain or problems noted with shortness of breath. Told pt and husband if anything changes with her WOB/feel SOB please let me know. NP and I agreed at the moment NIV is not indicated. RT suggested to leave pt on NRB for an hour and repeat ABG to track arterial saturation levels. NP consented and agreed, RN aware. RT will continue to monitor pt status.

## 2015-04-01 ENCOUNTER — Inpatient Hospital Stay (HOSPITAL_COMMUNITY): Payer: Managed Care, Other (non HMO)

## 2015-04-01 DIAGNOSIS — J9601 Acute respiratory failure with hypoxia: Secondary | ICD-10-CM

## 2015-04-01 DIAGNOSIS — I10 Essential (primary) hypertension: Secondary | ICD-10-CM | POA: Insufficient documentation

## 2015-04-01 LAB — COMPREHENSIVE METABOLIC PANEL
ALBUMIN: 1.6 g/dL — AB (ref 3.5–5.0)
ALK PHOS: 103 U/L (ref 38–126)
ALT: 12 U/L — ABNORMAL LOW (ref 14–54)
AST: 25 U/L (ref 15–41)
Anion gap: 10 (ref 5–15)
BILIRUBIN TOTAL: 1.3 mg/dL — AB (ref 0.3–1.2)
BUN: 18 mg/dL (ref 6–20)
CALCIUM: 7.9 mg/dL — AB (ref 8.9–10.3)
CO2: 20 mmol/L — ABNORMAL LOW (ref 22–32)
CREATININE: 1.09 mg/dL — AB (ref 0.44–1.00)
Chloride: 101 mmol/L (ref 101–111)
GFR calc Af Amer: >60 mL/min (ref >60)
GFR calc non Af Amer: 58 mL/min — ABNORMAL LOW (ref >60)
Glucose, Bld: 214 mg/dL — ABNORMAL HIGH (ref 65–99)
POTASSIUM: 3.7 mmol/L (ref 3.5–5.1)
Sodium: 131 mmol/L — ABNORMAL LOW (ref 135–145)
TOTAL PROTEIN: 5.9 g/dL — AB (ref 6.5–8.1)

## 2015-04-01 LAB — GLUCOSE, CAPILLARY
GLUCOSE-CAPILLARY: 197 mg/dL — AB (ref 65–99)
GLUCOSE-CAPILLARY: 226 mg/dL — AB (ref 65–99)
GLUCOSE-CAPILLARY: 168 mg/dL — AB (ref 65–99)
Glucose-Capillary: 245 mg/dL — ABNORMAL HIGH (ref 65–99)
Glucose-Capillary: 140 mg/dL — ABNORMAL HIGH (ref 65–99)
Glucose-Capillary: 128 mg/dL — ABNORMAL HIGH (ref 65–99)

## 2015-04-01 LAB — CBC
HEMATOCRIT: 23.2 % — AB (ref 36.0–46.0)
HEMOGLOBIN: 7.9 g/dL — AB (ref 12.0–15.0)
MCH: 27.4 pg (ref 26.0–34.0)
MCHC: 34.1 g/dL (ref 30.0–36.0)
MCV: 80.6 fL (ref 78.0–100.0)
Platelets: 169 10*3/uL (ref 150–400)
RBC: 2.88 MIL/uL — ABNORMAL LOW (ref 3.87–5.11)
RDW: 14.2 % (ref 11.5–15.5)
WBC: 19.3 10*3/uL — AB (ref 4.0–10.5)

## 2015-04-01 MED ORDER — SENNA 8.6 MG PO TABS
2.0000 | ORAL_TABLET | Freq: Every day | ORAL | Status: DC
  Administered 2015-04-01 – 2015-04-02 (×2): 17.2 mg via ORAL
  Filled 2015-04-01 (×6): qty 2

## 2015-04-01 MED ORDER — AMLODIPINE BESYLATE 5 MG PO TABS
5.0000 mg | ORAL_TABLET | Freq: Two times a day (BID) | ORAL | Status: DC
  Administered 2015-04-01 – 2015-04-07 (×12): 5 mg via ORAL
  Filled 2015-04-01 (×12): qty 1

## 2015-04-01 MED ORDER — LIDOCAINE HCL 1 % IJ SOLN
INTRAMUSCULAR | Status: AC
  Filled 2015-04-01: qty 20

## 2015-04-01 NOTE — Progress Notes (Signed)
Occupational Therapy Treatment Patient Details Name: [name] MRN: [id number] DOB: [birth year] Today's Date: 04/01/2015    History of present illness 49/F with DM, HTN admitted with sepsis, AKI, found to have GAS bacteremia, R shoulder Septic arthritis, NSVT, ID and Cards following with pt with I& D right shoulder.    OT comments  Husband arrived. Completed education with pt/husband regarding compensatory techniques for ADL and HEP for RUE. Goals met. Pt safe to D/C home with initial 24/7 S when medically stable. Will defer further OT to [place].   Follow Up Recommendations  Home health OT    Equipment Recommendations  Tub/shower seat    Recommendations for Other Services      Precautions / Restrictions Precautions Precautions: Fall Required Braces or Orthoses: Sling (for comfort) Restrictions Weight Bearing Restrictions: Yes RUE Weight Bearing: Weight bearing as tolerated              ADL       Functional mobility during ADLs: Min guard General ADL Comments: completed education with pt/husband regarding compensatory techniques for ADL. REcommended pt use showerseat and have someone physically assist her into/out of the tub. Educated husband on fall prevention in the home. Husband states 24/7 S will be arranged.                                       Cognition   Behavior During Therapy: WFL for tasks assessed/performed Overall Cognitive Status: Within Functional Limits for tasks assessed                         Exercises  Other Exercises Other Exercises: comleted education with husband regarding HRP including shoulder FF/ER and ABD in supine and sitting. towel slides/counter slides Other Exercises: elbow AROM Other Exercises: positioning of arm when sitting adn sleeping  Other Exercises: use of ice to reduce edema and pain Other Exercises: pendulum exercises Donning/doffing shirt without moving shoulder: husband verbalized  understadning Method for sponge bathing under operated UE: husband verbalized understanding Pendulum exercises (written home exercise program):  husband verbalized understanding ROM for elbow, wrist and digits of operated UE:  husband verbalized understanding Positioning of UE while sleeping: Supervision/safety   Shoulder Instructions     General Comments      Pertinent Vitals/ Pain       Pain Assessment: 0-10 Pain Score: 7  Pain Location: R shoulder Pain Descriptors / Indicators: Aching Pain Intervention(s): Limited activity within patient's tolerance  Home Living Family/patient expects to be discharged to:: Private residence Living Arrangements: Spouse/significant other;Children Available Help at Discharge: Family;Available PRN/intermittently (husband and twin boys -63 years old) Type of Home: House Home Access: Stairs to enter [employer] of Steps: 3 Entrance Stairs-Rails: Right Home Layout: Two level;Able to live on main level with bedroom/bathroom Alternate Level Stairs-Number of Steps: flight (upper level to childrens rooms)   Bathroom Shower/Tub: Teacher, early years/pre: Standard Bathroom Accessibility: Yes How Accessible: Accessible via walker Home Equipment: Shower seat (husband states they have a "stool" to sit on)   Additional Comments: Pt worked full time at [employer].  Husband is physician in [place] and children go to school.        Prior Functioning/Environment Level of Independence: Independent            Frequency Min 2X/week     Progress Toward Goals  OT Goals(current goals can  now be found in the care plan section)  Progress towards OT goals: Goals met/education completed, patient discharged from OT  Acute Rehab OT Goals Patient Stated Goal: to get better OT Goal Formulation: With patient Time For Goal Achievement: 04/15/15 Potential to Achieve Goals: Good ADL Goals Pt/caregiver will Perform Home Exercise Program:  Increased ROM;Right Upper extremity;With written HEP provided (with caregiver assisting) Additional ADL Goal #1: Pt/family will verbalize understadning of copensatory techniques for ADL  Plan All goals met and education completed, patient discharged from OT services    Co-evaluation                 End of Session     Activity Tolerance Patient tolerated treatment well   Patient Left in bed;with family/visitor present;with call bell/phone within reach   Nurse Communication Mobility status        Time: 3601-6580 OT Time Calculation (min): 16 min  Charges: OT General Charges $OT Visit: 1 Procedure  OT Treatments $Self Care/Home Management : 8-22 mins  Shatisha Falter,HILLARY 04/01/2015, 10:24 AM   Maurie Boettcher, OTR/L  [phone] 04/01/2015

## 2015-04-01 NOTE — Progress Notes (Signed)
Inpatient Diabetes Program Recommendations  AACE/ADA: New Consensus Statement on Inpatient Glycemic Control (2015)  Target Ranges:  Prepandial:   less than 140 mg/dL      Peak postprandial:   less than 180 mg/dL (1-2 hours)      Critically ill patients:  140 - 180 mg/dL   Review of Glycemic Control Results for DELCENIA, SABATER (MRN [id number]) as of 04/01/2015 10:32  Ref. Range 03/31/2015 17:03 03/31/2015 21:08 04/01/2015 07:33  Glucose-Capillary Latest Ref Range: 65-99 mg/dL 226 (H) 238 (H) 245 (H)   Inpatient Diabetes Program Recommendations:   Consider increasing Novolog meal dose.   Thank you  Raoul Pitch BSN, RN,CDE Inpatient Diabetes Coordinator [phone] (team pager)

## 2015-04-01 NOTE — Progress Notes (Signed)
IV team went to obtain a consent and place PICC line in the patient.  The patient's husband stated he does not want this patient to have a PICC line because he wants to preserve the peripheral vessels for possible future dialysis.  He states he is aware of other options and would like the patient to receive a tunneled central catheter in the chest. I advised that if dialysis is in her future, than that would be a wise decision and that we would relay this information to her nurse.  We informed Veneta Penton of this patients request.  Catalina Pizza

## 2015-04-01 NOTE — Progress Notes (Signed)
Occupational Therapy Evaluation Patient Details Name: [name] MRN: [id number] DOB: September 24, 1965 Today's Date: 04/01/2015    History of Present Illness 50/F with DM, HTN admitted with sepsis, AKI, found to have GAS bacteremia, R shoulder Septic arthritis, NSVT, ID and Cards following with pt with I& D right shoulder.    Clinical Impression   PTA, pt independent with ADL and mobility. Pt currently requires min A with mobility, mod A with ADL and decreased functional use of RUE. Recommend pt follow up with [place]. Will return to [employer] education with husband prior to D/C . Pt having PICC line placed this am.     Follow Up Recommendations  Home health OT;Supervision/Assistance - 24 hour (initially)    Equipment Recommendations  Tub/shower seat    Recommendations for Other Services       Precautions / Restrictions Precautions Precautions: Fall Required Braces or Orthoses: Sling Restrictions Weight Bearing Restrictions: Yes RUE Weight Bearing: Weight bearing as tolerated      Mobility Bed Mobility Overal bed mobility: Needs Assistance Bed Mobility: Sidelying to Sit;Sit to Supine Rolling: Supervision Sidelying to sit: Supervision          Transfers Overall transfer level: Needs assistance Equipment used: 1 person hand held assist   Sit to Stand: Supervision Stand pivot transfers: Min guard            Balance     Sitting balance-Leahy Scale: Fair       Standing balance-Leahy Scale: Fair                              ADL Overall ADL's : Needs assistance/impaired Eating/Feeding: Set up   Grooming: Moderate assistance   Upper Body Bathing: Moderate assistance   Lower Body Bathing: Moderate assistance   Upper Body Dressing : Moderate assistance   Lower Body Dressing: Moderate assistance   Toilet Transfer: Minimal assistance   Toileting- Clothing Manipulation and Hygiene: Minimal assistance       Functional mobility during ADLs:  Min guard General ADL Comments: educated pt on compensatory techniques for ADL     Vision     Perception     Praxis      Pertinent Vitals/Pain Pain Assessment: 0-10 Pain Score: 7  Pain Location: R shoulder and L wrist Pain Descriptors / Indicators: Aching;Discomfort Pain Intervention(s): Limited activity within patient's tolerance;Repositioned     Hand Dominance Right   Extremity/Trunk Assessment Upper Extremity Assessment Upper Extremity Assessment: RUE deficits/detail;LUE deficits/detail RUE Deficits / Details: AROM WFL with the exception of R shoulder. Passively tolerates @ 0-20 FF adn ER to neutral. Position of comfort is palm on stomach. RUE Coordination: decreased fine motor;decreased gross motor LUE Deficits / Details: L wrist pain but AROM WFL   Lower Extremity Assessment Lower Extremity Assessment: Defer to PT evaluation   Cervical / Trunk Assessment Cervical / Trunk Assessment: Normal   Communication Communication Communication: No difficulties   Cognition Arousal/Alertness: Awake/alert Behavior During Therapy: WFL for tasks assessed/performed Overall Cognitive Status: Within Functional Limits for tasks assessed                     General Comments       Exercises Exercises: Shoulder     Shoulder Instructions Shoulder Instructions Donning/doffing shirt without moving shoulder: Moderate assistance Method for sponge bathing under operated UE: Moderate assistance Pendulum exercises (written home exercise program): Minimal assistance ROM for elbow, wrist and digits of operated UE:  Minimal assistance Positioning of UE while sleeping: Supervision/safety    Home Living Family/patient expects to be discharged to:: Private residence Living Arrangements: Spouse/significant other;Children Available Help at Discharge: Family;Available PRN/intermittently (husband and twin boys -44 years old) Type of Home: House Home Access: Stairs to enter [employer] of Steps: 3 Entrance Stairs-Rails: Right Home Layout: Two level;Able to live on main level with bedroom/bathroom Alternate Level Stairs-Number of Steps: flight (upper level to childrens rooms)   Bathroom Shower/Tub: Teacher, early years/pre: Standard Bathroom Accessibility: Yes How Accessible: Accessible via walker Home Equipment: Shower seat (husband states they have a "stool" to sit on)   Additional Comments: Pt worked full time at [employer].  Husband is physician in [place] and children go to school.        Prior Functioning/Environment Level of Independence: Independent             OT Diagnosis: Generalized weakness;Acute pain   OT Problem List: Decreased strength;Decreased range of motion;Decreased activity tolerance;Impaired balance (sitting and/or standing);Decreased safety awareness;Decreased knowledge of use of DME or AE;Cardiopulmonary status limiting activity;Impaired UE functional use;Pain;Increased edema   OT Treatment/Interventions: Self-care/ADL training;Therapeutic exercise;Energy conservation;DME and/or AE instruction;Therapeutic activities;Patient/family education;Balance training    OT Goals(Current goals can be found in the care plan section) Acute Rehab OT Goals Patient Stated Goal: to get better OT Goal Formulation: With patient Time For Goal Achievement: 04/15/15 Potential to Achieve Goals: Good ADL Goals Pt/caregiver will Perform Home Exercise Program: Increased ROM;Right Upper extremity;With written HEP provided (with caregiver assisting) Additional ADL Goal #1: Pt/family will verbalize understadning of copensatory techniques for ADL  OT Frequency: Min 2X/week   Barriers to D/C:            Co-evaluation              End of Session Nurse Communication: Mobility status  Activity Tolerance: Patient tolerated treatment well Patient left: in bed;with call bell/phone within reach   Time: 0910-0928 OT Time Calculation  (min): 18 min Charges:  OT General Charges $OT Visit: 1 Procedure OT Evaluation $OT Eval Moderate Complexity: 1 Procedure G-Codes:    Daiya Tamer,HILLARY Apr 29, 2015, 10:18 AM   Maurie Boettcher, OTR/L  [phone] 04/29/15

## 2015-04-01 NOTE — Progress Notes (Signed)
PROGRESS NOTE  [name] [id number] DOB: 1965-12-28 DOA: 03/27/2015 PCP: [name], MD 50/F with DM, HTN admitted with sepsis, AKI, found to have GAS bacteremia, R shoulder Septic arthritis, NSVT, ID and Cards following. AKI resolved, R shoulder s/p tap today, gm stain positive, Ortho Dr.Murphy consulted Assessment/Plan: Sepsis -present at the time of admission -secondary to disseminated Streptococcus pyogenes bacteremia GAS Bacteremia with Septic Arthritis of R shoulder -Was on IV Daptomycin, now IV Penicillin since 1/16 per ID -had some sore throat prior to admission and son had strep throat 2weeks ago. -ID and Cardiology following Bacteremia Identified as S Pyogenes ID following On ABX , cardiology has cancelled TEE , Can reschedule if blood cultures remain positive  -per ID unlikely to have endocarditis, hence Cards deferred TEE, Due to Severe pain and swelling of R shoudler got MRi and today  -Arthrocentesis per IR--gram stain GPC in pairs and chains in R shoulder synovial fluid, c/w seeding and septic arthritis from bacteremia (culture neg) -Ortho consulted, d/w Dr.Tim Percell Miller, patient is status post arthroscopy, irrigation and debridement of right shoulder -04/01/2015--infectious disease ordered PICC line--husband refuses--demands for IR to place a tunnel catheter in the IJ due to his concerns of vein sclerosis presumptive need of dialysis although the patient has normal renal function at this time -I will speak with IR physician to check on feasibility  Right lower lobe infiltrate/HCAP? -Patient is small to moderate-sized pleural effusion, possible right basilar air space opacity, atelectasis versus pneumonia, increasing white count 21.2 this morning Patient currently on cefazolin and penicillin G Infectious disease following, would notify Dr.[name] , about this new finding PCCM consult requested per husband Unable to do ultrasound-guided thoracentesis  due to very small amount of fluid  Family requested transfer to [place], discussed with hospitalist (Dr Beckey Downing) there who has not accepted patient as they are on diversion due to unavailability of beds , husband aware that patient has not been accepted due to no medical indication for transfer  ABG 7.47, 29.7, 102  AKI  -Resolved -due to sepsis, poor Po intake, NSAIDs, ARB -Baseline creatinine thought to be within normal limits, on admit creatinine 5.26  -Nephrology consulted. Ultrasounds of abdomen and pelvis showed signs of medical renal disease and no signs of obstruction. -creatinine normalized, Renal following, IVF stopped  R shoulder Septic arthritis -see above -Secondary to disseminated Streptococcus biology needs bacteremia  DM2--controlled -metformin on hold, SSI, Hbaic is 7  Nonsustained ventricular tachycardia -had an episode of 30beats 1/15 pm -continue metoprolol, replaced K and Mag -2d ECHO with normal EF and wall motion;  no structural abnormalities -Cards following  Polyarthritis -See discussion on R shoulder septic arthritis as above, has inflammation of R elbow, wrist, L shoulder, elbow, wrist, much less pronounced than R shoulder -unable to use NSAIDs and steroids in setting of infection and acute kidney injury  Elevated Bili -Secondary to sepsis and cholestasis, ALP, AST/ALT WNL -improving  HTN -stable, holding ARB due to AKI\ -Elevation partly due to pain -Metoprolol tartrate increased to 25 mg every 6 hours, amlodipine added  Elevated B-HCG,  -she is not pregnant, LMP>1year ago, menopausal now, Pelvic US negative for gestational sac -suspect this could be from her pituitary gland, needs to be followed, GYN As outpatient  Anemia of chronic disease,  -hemoglobin 7.9,  -husband has requested one unit of packed red blood cells -I have explained to the husband that the patient's drop in hemoglobin is likely  dilutional and from frequent blood  draws    Family Communication:   Husband updated at beside Disposition Plan:   Home when medically stable       Procedures/Studies: US Soft Tissue Head/neck  03/27/2015  CLINICAL DATA:  Left-sided neck pain EXAM: ULTRASOUND LEFT NECK TECHNIQUE: Longitudinal and transverse images of the left neck region in the area of pain performed COMPARISON:  None. FINDINGS: Sonographic images of the left neck show no mass or inflammatory focus. No abnormal fluid collection. In particular, no abscess. Several tiny lymph nodes are noted in this area; by size criteria, there is no adenopathy in this area. Vascular structures of this area are widely patent. IMPRESSION: Scattered subcentimeter lymph nodes in the left neck, not meeting size criteria for pathologic significance. No mass or inflammatory focus appreciable. No abscess in particular. Electronically Signed   By: Lowella Grip III M.D.   On: 03/27/2015 17:19   [place] Chest  03/31/2015  CLINICAL DATA:  50 year old female with chest x-ray demonstrating right-sided small pleural effusion. EXAM: CHEST ULTRASOUND COMPARISON:  None. FINDINGS: Ultrasound survey of the right chest demonstrating small complex pleural effusion, not amenable to ultrasound-guided drainage. IMPRESSION: Small, complex appearing right pleural effusion not amenable for ultrasound-guided thoracentesis. Signed, Dulcy Fanny. Earleen Newport, DO [facility] Radiology Electronically Signed   By: [name] D.O.   On: 03/31/2015 11:05   US Abdomen Complete  03/27/2015  CLINICAL DATA:  Septic, generalized upper and lower abdominal pain, positive pregnancy test EXAM: ABDOMEN ULTRASOUND COMPLETE COMPARISON:  None FINDINGS: Gallbladder: Dependent echogenic material with minimal shadowing likely calculi in gallbladder. No gallbladder wall thickening, pericholecystic fluid, or sonographic Murphy sign. Common bile duct: Diameter: Normal caliber 4 mm diameter  Liver: Normal appearance IVC: Normal appearance Pancreas: Distal tail obscured by bowel gas. Visualized portion normal appearance. Spleen: Normal appearance, 8.5 cm length Right Kidney: Length: 12.5 cm. Increased cortical echogenicity. Normal cortical thickness. No mass or hydronephrosis. Left Kidney: Length: 12.7 cm. Increased cortical echogenicity. Normal cortical thickness. No mass or hydronephrosis. Abdominal aorta: Normal caliber Other findings: No free fluid IMPRESSION: Probable medical renal disease changes. Probable cholelithiasis without evidence acute cholecystitis. Remainder of exam unremarkable. Electronically Signed   By: Lavonia Dana M.D.   On: 03/27/2015 17:29   US Ob Comp Less 14 Wks  03/27/2015  CLINICAL DATA:  Low beta HCG of 27. Abdominal pain with sepsis. Elevated white blood cell count. EXAM: OBSTETRIC <14 WK [place] AND TRANSVAGINAL OB US TECHNIQUE: Both transabdominal and transvaginal ultrasound examinations were performed for complete evaluation of the gestation as well as the maternal uterus, adnexal regions, and pelvic cul-de-sac. Transvaginal technique was performed to assess early pregnancy. COMPARISON:  Ultrasound 02/13/2012 FINDINGS: Intrauterine gestational sac: None Yolk sac:  Not present Embryo:  Not present Maternal uterus/adnexae: The uterus is lobular and heterogeneous echotexture consistent multiple leiomyoma. This appears similar to the ultrasound of 2013. The endometrium is difficult to identified on the background of the multiple leiomyoma but measures 8 mm. Additionally, the ovaries are not identified. There is reported history of a LEFT oophorectomy in 2008. No free fluid. IMPRESSION: 1. Leiomyomatous uterus similar to 2013. 2. No decidual reaction or gestational sac identified. 3. No intrauterine gestational sac, yolk sac, or fetal pole identified. Differential considerations include intrauterine pregnancy too early to be sonographically visualized, missed abortion, or ectopic  pregnancy. Followup ultrasound is recommended in 10-14 days for further evaluation. Electronically Signed   By: Helane Gunther.D.  On: 03/27/2015 18:07   US Ob Transvaginal  03/27/2015  CLINICAL DATA:  Low beta HCG of 27. Abdominal pain with sepsis. Elevated white blood cell count. EXAM: OBSTETRIC <14 WK [place] AND TRANSVAGINAL OB US TECHNIQUE: Both transabdominal and transvaginal ultrasound examinations were performed for complete evaluation of the gestation as well as the maternal uterus, adnexal regions, and pelvic cul-de-sac. Transvaginal technique was performed to assess early pregnancy. COMPARISON:  Ultrasound 02/13/2012 FINDINGS: Intrauterine gestational sac: None Yolk sac:  Not present Embryo:  Not present Maternal uterus/adnexae: The uterus is lobular and heterogeneous echotexture consistent multiple leiomyoma. This appears similar to the ultrasound of 2013. The endometrium is difficult to identified on the background of the multiple leiomyoma but measures 8 mm. Additionally, the ovaries are not identified. There is reported history of a LEFT oophorectomy in 2008. No free fluid. IMPRESSION: 1. Leiomyomatous uterus similar to 2013. 2. No decidual reaction or gestational sac identified. 3. No intrauterine gestational sac, yolk sac, or fetal pole identified. Differential considerations include intrauterine pregnancy too early to be sonographically visualized, missed abortion, or ectopic pregnancy. Followup ultrasound is recommended in 10-14 days for further evaluation. Electronically Signed   By: Suzy Bouchard M.D.   On: 03/27/2015 18:07   Mr Shoulder Right Wo Contrast  03/30/2015  CLINICAL DATA:  Joint pain most severe in the right shoulder. Sepsis. EXAM: MRI OF THE RIGHT SHOULDER WITHOUT CONTRAST TECHNIQUE: Multiplanar, multisequence MR imaging of the shoulder was performed. No intravenous contrast was administered. COMPARISON:  03/27/2015 FINDINGS: Rotator cuff: Prominent partial thickness  articular surface tearing of the supraspinatus, with the articular surface portion retracted 1.7 cm on image 16 series 5, compatible with tendon delamination. Moderate tendinopathy of the remaining supraspinatus tendon. Similarly there is partial thickness articular surface tearing of the infraspinatus tendon with moderate infraspinatus tendinopathy. Moderate subscapularis tendinopathy is present along with potential partial thickness articular surface tearing Muscles: Abnormal edema tracks primarily along the marginal portions of the supraspinatus, subscapularis, teres minor, and teres major muscles. There is also low-level edema along the superficial fascia margin of the lateral deltoid. Biceps long head: Partial tearing or advanced tendinopathy of the intra-articular segment. Acromioclavicular Joint: Mild degenerative AC joint arthropathy. Subacromial morphology is type 2 (curved). Small but abnormal amount of fluid in the subacromial subdeltoid bursa. Glenohumeral Joint: Prominent glenohumeral joint effusion. Moderate degenerative chondral thinning along the glenohumeral joint. Labrum: Linear irregularity in the superior labrum with a blunted appearance, compatible with SLAP tear extending into the biceps anchor. Bones: Unusual flaring of the proximal humeral metadiaphysis, query prior fracture. Small right axillary lymph nodes are observed. IMPRESSION: 1. Large shoulder joint effusion with abnormal edema tracking within and along the margins of the regional musculature. In the setting of sepsis and new onset shoulder pain, there is a high degree of concern for septic glenohumeral joint, and arthrocentesis is likely indicated. 2. Partial thickness articular surface tearing of the supraspinatus and infraspinatus with tendon delamination. There is potentially mild partial thickness articular surface tearing of the subscapularis. Considerable tendinopathy of these tendons as well. 3. Advanced tendinopathy or  partial tearing of the long head of the biceps. 4. Increased signal in the superior labrum compatible with SLAP tear extending into the biceps anchor. 5. Subacromial subdeltoid bursitis. 6. Somewhat unusual flaring of the proximal humeral metadiaphysis, query prior fracture. 7. Moderate degenerative glenohumeral arthropathy. These results will be called to the ordering clinician or representative by the Radiologist Assistant, and communication documented in the PACS or zVision Dashboard. Electronically  Signed   By: Van Clines M.D.   On: 03/30/2015 07:28   Dg Chest Port 1 View  03/31/2015  CLINICAL DATA:  Acute onset of shortness of breath. Initial encounter. EXAM: PORTABLE CHEST 1 VIEW COMPARISON:  Chest radiograph from 03/27/2015 FINDINGS: A small to moderate right-sided pleural effusion is noted. Right basilar airspace opacity may reflect atelectasis or pneumonia. Asymmetric interstitial edema could have a similar appearance. Underlying vascular congestion is seen. No pneumothorax is identified. The cardiomediastinal silhouette is borderline enlarged. No acute osseous abnormalities are seen. IMPRESSION: 1. Small to moderate right-sided pleural effusion noted. Right basilar airspace opacity may reflect atelectasis or pneumonia. Asymmetric interstitial edema could have a similar appearance. 2. Underlying vascular congestion and borderline cardiomegaly. Electronically Signed   By: Garald Balding M.D.   On: 03/31/2015 01:49   Dg Chest Port 1 View  03/27/2015  CLINICAL DATA:  Fever for 3 days EXAM: PORTABLE CHEST 1 VIEW COMPARISON:  None. FINDINGS: Lungs are clear. Heart is upper normal in size with pulmonary vascularity within normal limits. No adenopathy. No bone lesions. IMPRESSION: No edema or consolidation. Electronically Signed   By: Lowella Grip III M.D.   On: 03/27/2015 15:43   Dg Fluoro Guided Needle Plc Aspiration/injection Loc  03/30/2015  CLINICAL DATA:  Prior MRI suspicious for septic  glenohumeral joint. Sepsis. EXAM: FLUORO GUIDED NEEDLE PLACEMENT FLUOROSCOPY TIME:  If the device does not provide the exposure index: Fluoroscopy Time (in minutes and seconds):  1 minutes and 42 seconds Number of Acquired Images:  None COMPARISON:  MRI of 03/29/2015. FINDINGS: Informed written and verbal consent were obtained. Risks and benefits of the procedure were discussed. A "Time out" was performed. The superior medial portion of the right humeral head was localized under fluoroscopic guidance. Skin was prepped and draped in standard sterile fashion. Skin was numbed with 1% lidocaine. A 20 gauge needle was inserted into the joint. No fluid could be aspirated. Intra-articular location was confirmed with approximately 6 cc of Omnipaque 180. Subsequent, approximately 5 cc of saline were injected. Only minimal fluid could be returned. IMPRESSION: Uncomplicated aspiration of the right shoulder, as detailed above. Minimal fluid was obtained, and sent to laboratory as requested. Electronically Signed   By: Abigail Miyamoto M.D.   On: 03/30/2015 12:11         Subjective: Patient continues to complain of pain in the right shoulder, right elbow as well as intermittent pain in the left upper extremity. Has some nausea without any emesis. Denies any chest pain, shortness of breath, diarrhea, abdominal pain, dysuria, hematuria.  Objective: Filed Vitals:   04/01/15 0000 04/01/15 0326 04/01/15 0734 04/01/15 0925  BP: 127/77 136/85 171/95 171/95  Pulse: 89 95 101 105  Temp:  99.6 F (37.6 C) 97.5 F (36.4 C)   TempSrc:  Axillary Oral   Resp: 18 21 19    Height:      Weight:      SpO2: 98% 93% 98%     Intake/Output Summary (Last 24 hours) at 04/01/15 1030 Last data filed at 04/01/15 0700  Gross per 24 hour  Intake   3415 ml  Output    750 ml  Net   2665 ml   Weight change:  Exam:   General:  Pt is alert, follows commands appropriately, not in acute distress  HEENT: No icterus, No thrush, No  neck mass, [place]/AT  Cardiovascular: RRR, S1/S2, no rubs, no gallops  Respiratory: Bibasilar crackles, L>R. No wheeze.  Abdomen: Soft/+BS,  non tender, non distended, no guarding  Extremities: No edema, No lymphangitis, No petechiae, No rashes, no synovitis  Data Reviewed: Basic Metabolic Panel:  Recent Labs Lab 03/28/15 0015 03/28/15 0944 03/28/15 1125 03/29/15 0624 03/30/15 0715 03/31/15 0105 04/01/15 0237  NA 137 138 154* 134* 131* 133* 131*  K 3.7 3.9 4.3 3.4* 3.8 3.8 3.7  CL 104 105 114* 106 100* 102 101  CO2 20* 20* 19* 20* 21* 21* 20*  GLUCOSE 141* 135* 139* 198* 179* 196* 214*  BUN 41* 31* 28* 19 15 16 18   CREATININE 2.52* 1.72* 1.77* 1.08* 0.84 0.95 1.09*  CALCIUM 7.1* 7.7* 7.7* 8.2* 8.6* 8.5* 7.9*  MG  --   --  1.7  --   --   --   --   PHOS 3.6 2.9  --   --   --   --   --    Liver Function Tests:  Recent Labs Lab 03/28/15 1125 03/29/15 0624 03/30/15 0715 03/31/15 0105 04/01/15 0237  AST 38 32 52* 36 25  ALT 27 27 24 22  12*  ALKPHOS 107 137* 161* 114 103  BILITOT 3.1* 4.0* 2.1* 1.2 1.3*  PROT 6.3* 6.5 6.2* 6.0* 5.9*  ALBUMIN 2.3* 2.2* 1.8* 1.7* 1.6*    Recent Labs Lab 03/27/15 1152  LIPASE 22    Recent Labs Lab 03/31/15 0105  AMMONIA 49*   CBC:  Recent Labs Lab 03/27/15 1152 03/28/15 0943 03/29/15 0624 03/30/15 0715 03/31/15 0105 04/01/15 0237  WBC 21.9* 18.2* 17.0* 22.5* 21.2* 19.3*  NEUTROABS 21.1*  --   --   --   --   --   HGB 11.4* 9.2* 9.3* 8.2* 7.9* 7.9*  HCT 33.6* 27.9* 27.9* 24.4* 23.6* 23.2*  MCV 82.0 82.1 81.3 81.3 81.1 80.6  PLT 176 157 168 193 177 169   Cardiac Enzymes:  Recent Labs Lab 03/28/15 0943  CKTOTAL 179   BNP: Invalid input(s): POCBNP CBG:  Recent Labs Lab 03/31/15 0734 03/31/15 1247 03/31/15 1703 03/31/15 2108 04/01/15 0733  GLUCAP 196* 197* 226* 238* 245*    Recent Results (from the past 240 hour(s))  Urine culture     Status: None   Collection Time: 03/27/15 12:42 PM  Result Value Ref  Range Status   Specimen Description URINE, CLEAN CATCH  Final   Special Requests Normal  Final   Culture   Final    >=100,000 COLONIES/mL GROUP A STREP (S.PYOGENES) ISOLATED   Report Status 03/29/2015 FINAL  Final  Culture, blood (Routine X 2) w Reflex to ID Panel     Status: None   Collection Time: 03/27/15  2:25 PM  Result Value Ref Range Status   Specimen Description BLOOD LEFT ANTECUBITAL  Final   Special Requests BOTTLES DRAWN AEROBIC ONLY 5CC  Final   Culture  Setup Time   Final    GRAM POSITIVE COCCI IN PAIRS IN CHAINS AEROBIC BOTTLE ONLY CRITICAL RESULT CALLED TO, READ BACK BY AND VERIFIED WITH: A. MINTZ,RN AT 0738 ON [id number] BY Rhea Bleacher    Culture   Final    GROUP A STREP (S.PYOGENES) ISOLATED SUSCEPTIBILITIES PERFORMED ON PREVIOUS CULTURE WITHIN THE LAST 5 DAYS.    Report Status 03/30/2015 FINAL  Final  Culture, blood (Routine X 2) w Reflex to ID Panel     Status: None   Collection Time: 03/27/15  3:00 PM  Result Value Ref Range Status   Specimen Description BLOOD RIGHT HAND  Final   Special Requests BOTTLES DRAWN  AEROBIC AND ANAEROBIC 5CC  Final   Culture  Setup Time   Final    GRAM POSITIVE COCCI IN PAIRS IN CHAINS IN BOTH AEROBIC AND ANAEROBIC BOTTLES CRITICAL RESULT CALLED TO, READ BACK BY AND VERIFIED WITH: A. MINTZ,RN AT 0848 ON [id number] BY Rhea Bleacher    Culture GROUP A STREP (S.PYOGENES) ISOLATED  Final   Report Status 03/30/2015 FINAL  Final   Organism ID, Bacteria GROUP A STREP (S.PYOGENES) ISOLATED  Final      Susceptibility   Group a strep (s.pyogenes) isolated - MIC*    CLINDAMYCIN >=1 RESISTANT Resistant     AMPICILLIN <=0.25 SENSITIVE Sensitive     ERYTHROMYCIN >=8 RESISTANT Resistant     VANCOMYCIN <=0.12 SENSITIVE Sensitive     CEFTRIAXONE <=0.12 SENSITIVE Sensitive     LEVOFLOXACIN <=0.25 SENSITIVE Sensitive     * GROUP A STREP (S.PYOGENES) ISOLATED  Culture, blood (routine x 2)     Status: None (Preliminary result)   Collection Time: 03/28/15   3:00 PM  Result Value Ref Range Status   Specimen Description BLOOD RIGHT HAND  Final   Special Requests BOTTLES DRAWN AEROBIC AND ANAEROBIC 5CC  Final   Culture NO GROWTH 3 DAYS  Final   Report Status PENDING  Incomplete  Culture, blood (routine x 2)     Status: None (Preliminary result)   Collection Time: 03/28/15  3:05 PM  Result Value Ref Range Status   Specimen Description BLOOD RIGHT HAND  Final   Special Requests BOTTLES DRAWN AEROBIC ONLY 8.5CC  Final   Culture NO GROWTH 3 DAYS  Final   Report Status PENDING  Incomplete  Gram stain     Status: None   Collection Time: 03/30/15 10:52 AM  Result Value Ref Range Status   Specimen Description FLUID SYNOVIAL RIGHT SHOULDER  Final   Special Requests NONE  Final   Gram Stain   Final    MODERATE WBC PRESENT, PREDOMINANTLY PMN FEW GRAM POSITIVE COCCI IN PAIRS IN CHAINS    Report Status 03/30/2015 FINAL  Final  Fungus Culture with Smear     Status: None (Preliminary result)   Collection Time: 03/30/15 10:52 AM  Result Value Ref Range Status   Specimen Description FLUID SYNOVIAL RIGHT SHOULDER  Final   Special Requests NONE  Final   Fungal Smear   Final    NO YEAST OR FUNGAL ELEMENTS SEEN Performed at [facility]    Culture   Final    CULTURE IN PROGRESS FOR FOUR WEEKS Performed at [facility]    Report Status PENDING  Incomplete  Anaerobic culture     Status: None (Preliminary result)   Collection Time: 03/30/15  7:42 PM  Result Value Ref Range Status   Specimen Description SYNOVIAL RIGHT SHOULDER  Final   Special Requests PATIENT ON FOLLOWING PENICILLIN  Final   Gram Stain   Final    ABUNDANT WBC PRESENT, PREDOMINANTLY PMN NO ORGANISMS SEEN    Culture   Final    NO ANAEROBES ISOLATED; CULTURE IN PROGRESS FOR 5 DAYS   Report Status PENDING  Incomplete  Body fluid culture     Status: None (Preliminary result)   Collection Time: 03/30/15  7:43 PM  Result Value Ref Range Status   Specimen Description  SYNOVIAL RIGHT SHOULDER  Final   Special Requests PATIENT ON FOLLOWING PENICILLIN  Final   Gram Stain   Final    ABUNDANT WBC PRESENT, PREDOMINANTLY PMN NO ORGANISMS SEEN  Culture NO GROWTH 2 DAYS  Final   Report Status PENDING  Incomplete  MRSA PCR Screening     Status: None   Collection Time: 03/31/15  1:55 AM  Result Value Ref Range Status   MRSA by PCR NEGATIVE NEGATIVE Final    Comment:        The GeneXpert MRSA Assay (FDA approved for NASAL specimens only), is one component of a comprehensive MRSA colonization surveillance program. It is not intended to diagnose MRSA infection nor to guide or monitor treatment for MRSA infections.      Scheduled Meds: . amLODipine  5 mg Oral BID  . docusate sodium  100 mg Oral BID  . insulin aspart  0-9 Units Subcutaneous TID WC  . insulin aspart  3 Units Subcutaneous TID WC  . metoprolol tartrate  25 mg Oral QID  . midazolam  2 mg Intravenous Once  . pencillin G potassium IV  4 Million Units Intravenous Q4H   Continuous Infusions: . 0.45 % NaCl with KCl 20 mEq / L 20 mL/hr at 03/31/15 2000  . sodium chloride 20 mL/hr at 03/31/15 1700  . lactated ringers 10 mL/hr at 03/30/15 1543     Infant Zink, DO  [facility] Pager [phone]  If 7PM-7AM, please contact night-coverage [email or web address] Password [email or web address] 04/01/2015, 10:30 AM   LOS: 5 days

## 2015-04-01 NOTE — Progress Notes (Signed)
[facility]  Patient Status: New pt for [facility] this admission  [facility] is providing the following services: HHRN, PT, OT and [facility] for home IV ABX. [facility] hospital team will follow [name] and support her DC home when ordered.   In hospital education regarding home IV ABX and CVC observation and safety provided for pt.  Will follow up tomorrow for further teaching and hopefully husband will be present.    If patient discharges after hours, please call [phone].   Larry Sierras 04/01/2015, 4:10 PM

## 2015-04-01 NOTE — Progress Notes (Signed)
[facility] for Infectious Disease   Reason for visit: Follow up on GAS bacteremia  Interval History: repeat blood cultures remain negative, Tmax 100.7, feels better, much improved pain.  Eating.    Physical Exam: Constitutional:  Filed Vitals:   04/01/15 0326 04/01/15 0734  BP: 136/85 171/95  Pulse: 95 101  Temp: 99.6 F (37.6 C) 97.5 F (36.4 C)  Resp: 21 19   patient appears in NAD Respiratory: Normal respiratory effort; CTA B Cardiovascular: RRR GI: soft, nt SKin: no rashes  Review of Systems: Constitutional: negative for chills Gastrointestinal: negative for nausea, vomiting and diarrhea Skin No rashes  Lab Results  Component Value Date   WBC 19.3* 04/01/2015   HGB 7.9* 04/01/2015   HCT 23.2* 04/01/2015   MCV 80.6 04/01/2015   PLT 169 04/01/2015    Lab Results  Component Value Date   CREATININE 1.09* 04/01/2015   BUN 18 04/01/2015   NA 131* 04/01/2015   K 3.7 04/01/2015   CL 101 04/01/2015   CO2 20* 04/01/2015    Lab Results  Component Value Date   ALT 12* 04/01/2015   AST 25 04/01/2015   ALKPHOS 103 04/01/2015     Microbiology: Recent Results (from the past 240 hour(s))  Urine culture     Status: None   Collection Time: 03/27/15 12:42 PM  Result Value Ref Range Status   Specimen Description URINE, CLEAN CATCH  Final   Special Requests Normal  Final   Culture   Final    >=100,000 COLONIES/mL GROUP A STREP (S.PYOGENES) ISOLATED   Report Status 03/29/2015 FINAL  Final  Culture, blood (Routine X 2) w Reflex to ID Panel     Status: None   Collection Time: 03/27/15  2:25 PM  Result Value Ref Range Status   Specimen Description BLOOD LEFT ANTECUBITAL  Final   Special Requests BOTTLES DRAWN AEROBIC ONLY 5CC  Final   Culture  Setup Time   Final    GRAM POSITIVE COCCI IN PAIRS IN CHAINS AEROBIC BOTTLE ONLY CRITICAL RESULT CALLED TO, READ BACK BY AND VERIFIED WITH: A. MINTZ,RN AT 0738 ON [id number] BY Rhea Bleacher    Culture   Final    GROUP A  STREP (S.PYOGENES) ISOLATED SUSCEPTIBILITIES PERFORMED ON PREVIOUS CULTURE WITHIN THE LAST 5 DAYS.    Report Status 03/30/2015 FINAL  Final  Culture, blood (Routine X 2) w Reflex to ID Panel     Status: None   Collection Time: 03/27/15  3:00 PM  Result Value Ref Range Status   Specimen Description BLOOD RIGHT HAND  Final   Special Requests BOTTLES DRAWN AEROBIC AND ANAEROBIC 5CC  Final   Culture  Setup Time   Final    GRAM POSITIVE COCCI IN PAIRS IN CHAINS IN BOTH AEROBIC AND ANAEROBIC BOTTLES CRITICAL RESULT CALLED TO, READ BACK BY AND VERIFIED WITH: A. MINTZ,RN AT 0848 ON [id number] BY Rhea Bleacher    Culture GROUP A STREP (S.PYOGENES) ISOLATED  Final   Report Status 03/30/2015 FINAL  Final   Organism ID, Bacteria GROUP A STREP (S.PYOGENES) ISOLATED  Final      Susceptibility   Group a strep (s.pyogenes) isolated - MIC*    CLINDAMYCIN >=1 RESISTANT Resistant     AMPICILLIN <=0.25 SENSITIVE Sensitive     ERYTHROMYCIN >=8 RESISTANT Resistant     VANCOMYCIN <=0.12 SENSITIVE Sensitive     CEFTRIAXONE <=0.12 SENSITIVE Sensitive     LEVOFLOXACIN <=0.25 SENSITIVE Sensitive     * GROUP  A STREP (S.PYOGENES) ISOLATED  Culture, blood (routine x 2)     Status: None (Preliminary result)   Collection Time: 03/28/15  3:00 PM  Result Value Ref Range Status   Specimen Description BLOOD RIGHT HAND  Final   Special Requests BOTTLES DRAWN AEROBIC AND ANAEROBIC 5CC  Final   Culture NO GROWTH 3 DAYS  Final   Report Status PENDING  Incomplete  Culture, blood (routine x 2)     Status: None (Preliminary result)   Collection Time: 03/28/15  3:05 PM  Result Value Ref Range Status   Specimen Description BLOOD RIGHT HAND  Final   Special Requests BOTTLES DRAWN AEROBIC ONLY 8.5CC  Final   Culture NO GROWTH 3 DAYS  Final   Report Status PENDING  Incomplete  Gram stain     Status: None   Collection Time: 03/30/15 10:52 AM  Result Value Ref Range Status   Specimen Description FLUID SYNOVIAL RIGHT SHOULDER   Final   Special Requests NONE  Final   Gram Stain   Final    MODERATE WBC PRESENT, PREDOMINANTLY PMN FEW GRAM POSITIVE COCCI IN PAIRS IN CHAINS    Report Status 03/30/2015 FINAL  Final  Fungus Culture with Smear     Status: None (Preliminary result)   Collection Time: 03/30/15 10:52 AM  Result Value Ref Range Status   Specimen Description FLUID SYNOVIAL RIGHT SHOULDER  Final   Special Requests NONE  Final   Fungal Smear   Final    NO YEAST OR FUNGAL ELEMENTS SEEN Performed at [facility]    Culture   Final    CULTURE IN PROGRESS FOR FOUR WEEKS Performed at [facility]    Report Status PENDING  Incomplete  Anaerobic culture     Status: None (Preliminary result)   Collection Time: 03/30/15  7:42 PM  Result Value Ref Range Status   Specimen Description SYNOVIAL RIGHT SHOULDER  Final   Special Requests PATIENT ON FOLLOWING PENICILLIN  Final   Gram Stain   Final    ABUNDANT WBC PRESENT, PREDOMINANTLY PMN NO ORGANISMS SEEN    Culture   Final    NO ANAEROBES ISOLATED; CULTURE IN PROGRESS FOR 5 DAYS   Report Status PENDING  Incomplete  Body fluid culture     Status: None (Preliminary result)   Collection Time: 03/30/15  7:43 PM  Result Value Ref Range Status   Specimen Description SYNOVIAL RIGHT SHOULDER  Final   Special Requests PATIENT ON FOLLOWING PENICILLIN  Final   Gram Stain   Final    ABUNDANT WBC PRESENT, PREDOMINANTLY PMN NO ORGANISMS SEEN    Culture NO GROWTH 2 DAYS  Final   Report Status PENDING  Incomplete  MRSA PCR Screening     Status: None   Collection Time: 03/31/15  1:55 AM  Result Value Ref Range Status   MRSA by PCR NEGATIVE NEGATIVE Final    Comment:        The GeneXpert MRSA Assay (FDA approved for NASAL specimens only), is one component of a comprehensive MRSA colonization surveillance program. It is not intended to diagnose MRSA infection nor to guide or monitor treatment for MRSA infections.     Impression/Plan:  1.  Disseminated GAS bacteremia - on penicillin and WBC improved, seems to be improving overall.  No changes 2.  Septic arthritis - improved pain and movement of shoulder.  Recommend treatment for 4 weeks with IV penicillin.  I will order a picc line.

## 2015-04-01 NOTE — Care Management Note (Signed)
Case Management Note  Patient Details  Name: Laysa Mahieu MRN: [id number] Date of Birth: 08/31/1965  Subjective/Objective:    Adm w shoulder surg                Action/Plan: lives w fam, pcp dr polite   Expected Discharge Date:                  Expected Discharge Plan:  [place]  In-House Referral:     Discharge planning Services  CM Consult  Post Acute Care Choice:  Durable Medical Equipment, Home Health Choice offered to:     DME Arranged:  IV pump/equipment DME Agency:  [place] Arranged:  RN, PT, OT [facility] Agency:  [place]  Status of Service:  Completed, signed off  Medicare Important Message Given:    Date Medicare IM Given:    Medicare IM give by:    Date Additional Medicare IM Given:    Additional Medicare Important Message give by:     If discussed at [place] of Stay Meetings, dates discussed:    Additional Comments: spoke w pt and fam. Went over list of hhc agencies and no pref. md had rec [facility] and pt ok w [facility] for hhrn for iv antibiotics and hhpt and hhot. Ref to pam w [facility] for hhrn-hhpt-hhot. Does not feel needs any eq.  Lacretia Leigh, RN 04/01/2015, 9:56 AM

## 2015-04-01 NOTE — Progress Notes (Signed)
Subjective: No CP  Feeling much better than yesterday    Objective: Filed Vitals:   04/01/15 0000 04/01/15 0326 04/01/15 0734 04/01/15 0925  BP: 127/77 136/85 171/95 171/95  Pulse: 89 95 101 105  Temp:  99.6 F (37.6 C) 97.5 F (36.4 C)   TempSrc:  Axillary Oral   Resp: 18 21 19    Height:      Weight:      SpO2: 98% 93% 98%    Weight change:   Intake/Output Summary (Last 24 hours) at 04/01/15 0957 Last data filed at 04/01/15 0700  Gross per 24 hour  Intake   3435 ml  Output    750 ml  Net   2685 ml    General: Alert, awake, oriented x3, in no acute distress Neck:  JVP is normal Heart: Regular rate and rhythm, without murmurs, rubs, gallops.  Lungs:Decreased BS R base   Exemities: R shoulder and L wrist still hurt   Neuro: Grossly intact, nonfocal.  TEle  SR / ST  Rates 90s   Lab Results: Results for orders placed or performed during the hospital encounter of 03/27/15 (from the past 24 hour(s))  Type and screen [place]     Status: None (Preliminary result)   Collection Time: 03/31/15 11:21 AM  Result Value Ref Range   ABO/RH(D) O POS    Antibody Screen NEG    Sample Expiration 04/03/2015    Unit Number [id number]    Blood Component Type RED CELLS,LR    Unit division 00    Status of Unit ISSUED    Transfusion Status OK TO TRANSFUSE    Crossmatch Result Compatible   Prepare RBC     Status: None   Collection Time: 03/31/15 11:21 AM  Result Value Ref Range   Order Confirmation ORDER PROCESSED BY BLOOD BANK   ABO/Rh     Status: None   Collection Time: 03/31/15 11:21 AM  Result Value Ref Range   ABO/RH(D) O POS   Glucose, capillary     Status: Abnormal   Collection Time: 03/31/15 12:47 PM  Result Value Ref Range   Glucose-Capillary 197 (H) 65 - 99 mg/dL   Comment 1 Notify RN   Glucose, capillary     Status: Abnormal   Collection Time: 03/31/15  5:03 PM  Result Value Ref Range   Glucose-Capillary 226 (H) 65 - 99 mg/dL   Comment 1  Notify RN   Glucose, capillary     Status: Abnormal   Collection Time: 03/31/15  9:08 PM  Result Value Ref Range   Glucose-Capillary 238 (H) 65 - 99 mg/dL   Comment 1 Capillary Specimen   CBC     Status: Abnormal   Collection Time: 04/01/15  2:37 AM  Result Value Ref Range   WBC 19.3 (H) 4.0 - 10.5 K/uL   RBC 2.88 (L) 3.87 - 5.11 MIL/uL   Hemoglobin 7.9 (L) 12.0 - 15.0 g/dL   HCT 23.2 (L) 36.0 - 46.0 %   MCV 80.6 78.0 - 100.0 fL   MCH 27.4 26.0 - 34.0 pg   MCHC 34.1 30.0 - 36.0 g/dL   RDW 14.2 11.5 - 15.5 %   Platelets 169 150 - 400 K/uL  Comprehensive metabolic panel     Status: Abnormal   Collection Time: 04/01/15  2:37 AM  Result Value Ref Range   Sodium 131 (L) 135 - 145 mmol/L   Potassium 3.7 3.5 - 5.1 mmol/L   Chloride 101  101 - 111 mmol/L   CO2 20 (L) 22 - 32 mmol/L   Glucose, Bld 214 (H) 65 - 99 mg/dL   BUN 18 6 - 20 mg/dL   Creatinine, Ser 1.09 (H) 0.44 - 1.00 mg/dL   Calcium 7.9 (L) 8.9 - 10.3 mg/dL   Total Protein 5.9 (L) 6.5 - 8.1 g/dL   Albumin 1.6 (L) 3.5 - 5.0 g/dL   AST 25 15 - 41 U/L   ALT 12 (L) 14 - 54 U/L   Alkaline Phosphatase 103 38 - 126 U/L   Total Bilirubin 1.3 (H) 0.3 - 1.2 mg/dL   GFR calc non Af Amer 58 (L) >60 mL/min   GFR calc Af Amer >60 >60 mL/min   Anion gap 10 5 - 15  Glucose, capillary     Status: Abnormal   Collection Time: 04/01/15  7:33 AM  Result Value Ref Range   Glucose-Capillary 245 (H) 65 - 99 mg/dL   Comment 1 Venous Specimen     Studies/Results: [place] Chest  03/31/2015  CLINICAL DATA:  50 year old female with chest x-ray demonstrating right-sided small pleural effusion. EXAM: CHEST ULTRASOUND COMPARISON:  None. FINDINGS: Ultrasound survey of the right chest demonstrating small complex pleural effusion, not amenable to ultrasound-guided drainage. IMPRESSION: Small, complex appearing right pleural effusion not amenable for ultrasound-guided thoracentesis. Signed, Dulcy Fanny. Earleen Newport, DO Vascular and Interventional Radiology  Specialists [facility] Radiology Electronically Signed   By: [name] D.O.   On: 03/31/2015 11:05    Medications: Reviewed   @PROBHOSP @  1  NSVT  No recurrence  Continue b blocker  HR improved  2.  HTN  BP is improved but still high  I will increase amlodipine  Follow  3.  ID  Pt clinically improving     LOS: 5 days   Dorris Carnes 04/01/2015, 9:57 AM

## 2015-04-01 NOTE — Procedures (Signed)
Interventional Radiology Procedure Note  Procedure: Image guided right IJ tunneled cuffed hickman, DL, measuring 24cm.   Complications: None Recommendations:  - Ok to use catheter.   - Do not submerge for 7 days - Routine line care   Signed,  Dulcy Fanny. Earleen Newport, DO

## 2015-04-02 LAB — CBC WITH DIFFERENTIAL/PLATELET
BASOS ABS: 0 10*3/uL (ref 0.0–0.1)
Basophils Relative: 0 %
EOS PCT: 1 %
Eosinophils Absolute: 0.2 10*3/uL (ref 0.0–0.7)
HEMATOCRIT: 22 % — AB (ref 36.0–46.0)
Hemoglobin: 7.3 g/dL — ABNORMAL LOW (ref 12.0–15.0)
LYMPHS PCT: 12 %
Lymphs Abs: 1.9 10*3/uL (ref 0.7–4.0)
MCH: 27.3 pg (ref 26.0–34.0)
MCHC: 33.2 g/dL (ref 30.0–36.0)
MCV: 82.4 fL (ref 78.0–100.0)
MONOS PCT: 7 %
Monocytes Absolute: 1.1 10*3/uL — ABNORMAL HIGH (ref 0.1–1.0)
NEUTROS ABS: 12.8 10*3/uL — AB (ref 1.7–7.7)
Neutrophils Relative %: 80 %
PLATELETS: 174 10*3/uL (ref 150–400)
RBC: 2.67 MIL/uL — AB (ref 3.87–5.11)
RDW: 14.5 % (ref 11.5–15.5)
WBC: 16 10*3/uL — ABNORMAL HIGH (ref 4.0–10.5)

## 2015-04-02 LAB — RETICULOCYTES
RBC.: 2.84 MIL/uL — ABNORMAL LOW (ref 3.87–5.11)
Retic Count, Absolute: 82.4 10*3/uL (ref 19.0–186.0)
Retic Ct Pct: 2.9 % (ref 0.4–3.1)

## 2015-04-02 LAB — CULTURE, BLOOD (ROUTINE X 2)
CULTURE: NO GROWTH
Culture: NO GROWTH

## 2015-04-02 LAB — BASIC METABOLIC PANEL
ANION GAP: 7 (ref 5–15)
BUN: 17 mg/dL (ref 6–20)
CHLORIDE: 98 mmol/L — AB (ref 101–111)
CO2: 25 mmol/L (ref 22–32)
Calcium: 8 mg/dL — ABNORMAL LOW (ref 8.9–10.3)
Creatinine, Ser: 1.11 mg/dL — ABNORMAL HIGH (ref 0.44–1.00)
GFR calc Af Amer: >60 mL/min (ref >60)
GFR, EST NON AFRICAN AMERICAN: 57 mL/min — AB (ref >60)
GLUCOSE: 151 mg/dL — AB (ref 65–99)
POTASSIUM: 3.6 mmol/L (ref 3.5–5.1)
Sodium: 130 mmol/L — ABNORMAL LOW (ref 135–145)

## 2015-04-02 LAB — OCCULT BLOOD X 1 CARD TO LAB, STOOL: Fecal Occult Bld: POSITIVE — AB

## 2015-04-02 LAB — IRON AND TIBC
Iron: 19 ug/dL — ABNORMAL LOW (ref 28–170)
Saturation Ratios: 13 % (ref 10.4–31.8)
TIBC: 146 ug/dL — ABNORMAL LOW (ref 250–450)
UIBC: 127 ug/dL

## 2015-04-02 LAB — PREPARE RBC (CROSSMATCH)

## 2015-04-02 LAB — GLUCOSE, CAPILLARY
Glucose-Capillary: 157 mg/dL — ABNORMAL HIGH (ref 65–99)
Glucose-Capillary: 164 mg/dL — ABNORMAL HIGH (ref 65–99)
Glucose-Capillary: 208 mg/dL — ABNORMAL HIGH (ref 65–99)
Glucose-Capillary: 201 mg/dL — ABNORMAL HIGH (ref 65–99)

## 2015-04-02 LAB — FERRITIN: Ferritin: 628 ng/mL — ABNORMAL HIGH (ref 11–307)

## 2015-04-02 LAB — FOLATE: Folate: 11.5 ng/mL (ref >5.9)

## 2015-04-02 LAB — VITAMIN B12: VITAMIN B 12: 3001 pg/mL — AB (ref 180–914)

## 2015-04-02 MED ORDER — SODIUM CHLORIDE 0.9 % IV SOLN: Freq: Once | INTRAVENOUS | Status: DC

## 2015-04-02 MED ORDER — METOPROLOL TARTRATE 50 MG PO TABS
50.0000 mg | ORAL_TABLET | Freq: Two times a day (BID) | ORAL | Status: DC
  Administered 2015-04-02 – 2015-04-07 (×11): 50 mg via ORAL
  Filled 2015-04-02 (×11): qty 1

## 2015-04-02 MED ORDER — POLYETHYLENE GLYCOL 3350 17 G PO PACK
17.0000 g | PACK | Freq: Every day | ORAL | Status: DC
  Filled 2015-04-02 (×4): qty 1

## 2015-04-02 MED ORDER — SODIUM CHLORIDE 0.9 % IJ SOLN
10.0000 mL | INTRAMUSCULAR | Status: DC | PRN
  Administered 2015-04-03 – 2015-04-07 (×3): 10 mL
  Filled 2015-04-02 (×3): qty 40

## 2015-04-02 MED ORDER — FERROUS SULFATE 325 (65 FE) MG PO TABS
325.0000 mg | ORAL_TABLET | Freq: Two times a day (BID) | ORAL | Status: DC
  Administered 2015-04-03 – 2015-04-07 (×10): 325 mg via ORAL
  Filled 2015-04-02 (×10): qty 1

## 2015-04-02 MED ORDER — SODIUM CHLORIDE 0.9 % IJ SOLN
10.0000 mL | Freq: Two times a day (BID) | INTRAMUSCULAR | Status: DC
  Administered 2015-04-02 – 2015-04-03 (×2): 10 mL

## 2015-04-02 MED ORDER — FUROSEMIDE 10 MG/ML IJ SOLN
40.0000 mg | Freq: Once | INTRAMUSCULAR | Status: AC
  Administered 2015-04-02: 40 mg via INTRAVENOUS
  Filled 2015-04-02: qty 4

## 2015-04-02 NOTE — Progress Notes (Signed)
[place] for Infectious Disease   Reason for visit: Follow up on GAS bacteremia  Interval History: repeat blood cultures remain negative, afebrile, feels better, much improved pain.  Eating.  WBC down to 16.   Physical Exam: Constitutional:  Filed Vitals:   04/02/15 0600 04/02/15 0800  BP: 145/83 155/86  Pulse: 98 100  Temp:  98.7 F (37.1 C)  Resp: 17 24   patient appears in NAD Respiratory: Normal respiratory effort; CTA B Cardiovascular: RRR GI: soft, nt SKin: no rashes CHEST: right CV line placed  Review of Systems: Constitutional: negative for chills Gastrointestinal: negative for nausea, vomiting and diarrhea Skin No rashes  Lab Results  Component Value Date   WBC 16.0* 04/02/2015   HGB 7.3* 04/02/2015   HCT 22.0* 04/02/2015   MCV 82.4 04/02/2015   PLT 174 04/02/2015    Lab Results  Component Value Date   CREATININE 1.11* 04/02/2015   BUN 17 04/02/2015   NA 130* 04/02/2015   K 3.6 04/02/2015   CL 98* 04/02/2015   CO2 25 04/02/2015    Lab Results  Component Value Date   ALT 12* 04/01/2015   AST 25 04/01/2015   ALKPHOS 103 04/01/2015     Microbiology: Recent Results (from the past 240 hour(s))  Urine culture     Status: None   Collection Time: 03/27/15 12:42 PM  Result Value Ref Range Status   Specimen Description URINE, CLEAN CATCH  Final   Special Requests Normal  Final   Culture   Final    >=100,000 COLONIES/mL GROUP A STREP (S.PYOGENES) ISOLATED   Report Status 03/29/2015 FINAL  Final  Culture, blood (Routine X 2) w Reflex to ID Panel     Status: None   Collection Time: 03/27/15  2:25 PM  Result Value Ref Range Status   Specimen Description BLOOD LEFT ANTECUBITAL  Final   Special Requests BOTTLES DRAWN AEROBIC ONLY 5CC  Final   Culture  Setup Time   Final    GRAM POSITIVE COCCI IN PAIRS IN CHAINS AEROBIC BOTTLE ONLY CRITICAL RESULT CALLED TO, READ BACK BY AND VERIFIED WITH: A. MINTZ,RN AT 0738 ON [id number] BY Rhea Bleacher    Culture   Final    GROUP A STREP (S.PYOGENES) ISOLATED SUSCEPTIBILITIES PERFORMED ON PREVIOUS CULTURE WITHIN THE LAST 5 DAYS.    Report Status 03/30/2015 FINAL  Final  Culture, blood (Routine X 2) w Reflex to ID Panel     Status: None   Collection Time: 03/27/15  3:00 PM  Result Value Ref Range Status   Specimen Description BLOOD RIGHT HAND  Final   Special Requests BOTTLES DRAWN AEROBIC AND ANAEROBIC 5CC  Final   Culture  Setup Time   Final    GRAM POSITIVE COCCI IN PAIRS IN CHAINS IN BOTH AEROBIC AND ANAEROBIC BOTTLES CRITICAL RESULT CALLED TO, READ BACK BY AND VERIFIED WITH: A. MINTZ,RN AT 0848 ON [id number] BY Rhea Bleacher    Culture GROUP A STREP (S.PYOGENES) ISOLATED  Final   Report Status 03/30/2015 FINAL  Final   Organism ID, Bacteria GROUP A STREP (S.PYOGENES) ISOLATED  Final      Susceptibility   Group a strep (s.pyogenes) isolated - MIC*    CLINDAMYCIN >=1 RESISTANT Resistant     AMPICILLIN <=0.25 SENSITIVE Sensitive     ERYTHROMYCIN >=8 RESISTANT Resistant     VANCOMYCIN <=0.12 SENSITIVE Sensitive     CEFTRIAXONE <=0.12 SENSITIVE Sensitive     LEVOFLOXACIN <=0.25 SENSITIVE Sensitive     *  GROUP A STREP (S.PYOGENES) ISOLATED  Culture, blood (routine x 2)     Status: None (Preliminary result)   Collection Time: 03/28/15  3:00 PM  Result Value Ref Range Status   Specimen Description BLOOD RIGHT HAND  Final   Special Requests BOTTLES DRAWN AEROBIC AND ANAEROBIC 5CC  Final   Culture NO GROWTH 4 DAYS  Final   Report Status PENDING  Incomplete  Culture, blood (routine x 2)     Status: None (Preliminary result)   Collection Time: 03/28/15  3:05 PM  Result Value Ref Range Status   Specimen Description BLOOD RIGHT HAND  Final   Special Requests BOTTLES DRAWN AEROBIC ONLY 8.5CC  Final   Culture NO GROWTH 4 DAYS  Final   Report Status PENDING  Incomplete  Gram stain     Status: None   Collection Time: 03/30/15 10:52 AM  Result Value Ref Range Status   Specimen Description  FLUID SYNOVIAL RIGHT SHOULDER  Final   Special Requests NONE  Final   Gram Stain   Final    MODERATE WBC PRESENT, PREDOMINANTLY PMN FEW GRAM POSITIVE COCCI IN PAIRS IN CHAINS    Report Status 03/30/2015 FINAL  Final  Fungus Culture with Smear     Status: None (Preliminary result)   Collection Time: 03/30/15 10:52 AM  Result Value Ref Range Status   Specimen Description FLUID SYNOVIAL RIGHT SHOULDER  Final   Special Requests NONE  Final   Fungal Smear   Final    NO YEAST OR FUNGAL ELEMENTS SEEN Performed at [facility]    Culture   Final    CULTURE IN PROGRESS FOR FOUR WEEKS Performed at [facility]    Report Status PENDING  Incomplete  Anaerobic culture     Status: None (Preliminary result)   Collection Time: 03/30/15  7:42 PM  Result Value Ref Range Status   Specimen Description SYNOVIAL RIGHT SHOULDER  Final   Special Requests PATIENT ON FOLLOWING PENICILLIN  Final   Gram Stain   Final    ABUNDANT WBC PRESENT, PREDOMINANTLY PMN NO ORGANISMS SEEN    Culture   Final    NO ANAEROBES ISOLATED; CULTURE IN PROGRESS FOR 5 DAYS   Report Status PENDING  Incomplete  Body fluid culture     Status: None (Preliminary result)   Collection Time: 03/30/15  7:43 PM  Result Value Ref Range Status   Specimen Description SYNOVIAL RIGHT SHOULDER  Final   Special Requests PATIENT ON FOLLOWING PENICILLIN  Final   Gram Stain   Final    ABUNDANT WBC PRESENT, PREDOMINANTLY PMN NO ORGANISMS SEEN    Culture NO GROWTH 2 DAYS  Final   Report Status PENDING  Incomplete  MRSA PCR Screening     Status: None   Collection Time: 03/31/15  1:55 AM  Result Value Ref Range Status   MRSA by PCR NEGATIVE NEGATIVE Final    Comment:        The GeneXpert MRSA Assay (FDA approved for NASAL specimens only), is one component of a comprehensive MRSA colonization surveillance program. It is not intended to diagnose MRSA infection nor to guide or monitor treatment for MRSA infections.      Impression/Plan:  1. Disseminated GAS bacteremia - on penicillin and WBC improved, seems to be improving overall.   Treat with penicillin (if available in a pump) for 4 weeks total from surgery 1/17 through February 13th.   Weekly cmp, cbc to [facility] We will arrange follow  up in [facility] in about 2 weeks  2.  Septic arthritis - improved pain and movement of shoulder.  Recommend treatment for 4 weeks with IV penicillin.  Husband refused picc line for subclavian line instead.   I will sign off, please call with questions. thanks

## 2015-04-02 NOTE — Progress Notes (Signed)
Pharmacy Antibiotic Follow-up Note  [name] is a 50 y.o. year-old female admitted on 03/27/2015.  The patient is currently on day 7 of antibiotics for bacteremia.  Narrowed to penicillin 1/16.  Scr improved, estimated CrCl ~ 65 ml/min.  Assessment/Plan: 1. Continue penicillin GK 4 million units q 4 hrs, planned stop date of 04/26/15 2. Continue to monitor renal function.  Planning to f/u in [place] clinic at discharge.  Temp (24hrs), Avg:99.2 F (37.3 C), Min:98.7 F (37.1 C), Max:99.6 F (37.6 C)   Recent Labs Lab 03/29/15 0624 03/30/15 0715 03/31/15 0105 04/01/15 0237 04/02/15 0345  WBC 17.0* 22.5* 21.2* 19.3* 16.0*     Recent Labs Lab 03/29/15 0624 03/30/15 0715 03/31/15 0105 04/01/15 0237 04/02/15 0345  CREATININE 1.08* 0.84 0.95 1.09* 1.11*   Estimated Creatinine Clearance: 67.8 mL/min (by C-G formula based on Cr of 1.11).    No Known Allergies  Antimicrobials this admission: Zosyn 1/14 x 1 Dapto 1/14>>1/16 Pen G 1/16>> Ancef 1/17 x3 doses (post op)  Levels/dose changes this admission: daptomycin dose changed 1/16 to 480mg  IV q24h from 476.5mg  IV q48h for ease of compounding as well as improvement in renal function.  1/15 CK = 179  Microbiology results: 1/14 BCx: group A strep 1/14 UC: >100K group A strep 1/15 BCx: ngtd 1/17 synovial fluid: mod GPC  Thank you for allowing pharmacy to be a part of this patient's care.  Uvaldo Rising, BCPS  Clinical Pharmacist Pager [phone]  04/02/2015 10:05 AM

## 2015-04-02 NOTE — Progress Notes (Signed)
Physical Therapy Treatment Patient Details Name: [name] MRN: [id number] DOB: 1965/10/07 Today's Date: 04/02/2015    History of Present Illness 49/F with DM, HTN admitted with sepsis, AKI, found to have GAS bacteremia, R shoulder Septic arthritis, NSVT, ID and Cards following with pt with I& D right shoulder.     PT Comments    Patient demonstrates modest deficits with ambulation but was able to mobilize safely around unit with cues for safety and stability. Reinforced HEP program with patient re:RUE. Patient demonstrates good technique for AAROM.   Follow Up Recommendations  Home health PT;Supervision/Assistance - 24 hour     Equipment Recommendations  Other (comment) (TBA)    Recommendations for Other Services       Precautions / Restrictions Precautions Precautions: Fall Required Braces or Orthoses: Sling Restrictions Weight Bearing Restrictions: Yes RUE Weight Bearing: Weight bearing as tolerated    Mobility  Bed Mobility Overal bed mobility: Needs Assistance Bed Mobility: Sidelying to Sit;Sit to Supine Rolling: Supervision Sidelying to sit: Supervision       General bed mobility comments: Needed assist to elevate trunk due to right shoulder immobility.    Transfers Overall transfer level: Needs assistance Equipment used: 1 person hand held assist;None Transfers: Sit to/from Stand Sit to Stand: Supervision Stand pivot transfers: Min guard          Ambulation/Gait Ambulation/Gait assistance: Min guard Ambulation Distance (Feet): 340 Feet   Gait Pattern/deviations: Step-through pattern;Decreased stride length;Drifts right/left;Narrow base of support Gait velocity: initially decreased, improved with cues   General Gait Details: some instability noted with ambulation, educated on safety with mobility   Stairs            Wheelchair Mobility    Modified Rankin (Stroke Patients Only)       Balance   Sitting-balance support: No  upper extremity supported Sitting balance-Leahy Scale: Fair     Standing balance support: Single extremity supported Standing balance-Leahy Scale: Fair                      Cognition Arousal/Alertness: Awake/alert Behavior During Therapy: WFL for tasks assessed/performed Overall Cognitive Status: Within Functional Limits for tasks assessed                      Exercises Shoulder Exercises Shoulder Flexion: AAROM;5 reps;Self ROM Elbow Flexion: AROM;AAROM;Right;10 reps Elbow Extension: AROM;AAROM;Right;10 reps Other Exercises Other Exercises: educated patient regarding need to perform AAROM routinely Other Exercises: patient demonstrates good carry over of technique for HEP from previous session Other Exercises: educated on ice and elevation    General Comments        Pertinent Vitals/Pain Pain Assessment: 0-10 Pain Score: 5  Pain Location: right shoulder Pain Descriptors / Indicators: Aching Pain Intervention(s): Monitored during session    Home Living                      Prior Function            PT Goals (current goals can now be found in the care plan section) Acute Rehab PT Goals Patient Stated Goal: to get better PT Goal Formulation: With patient Time For Goal Achievement: 04/14/15 Potential to Achieve Goals: Good Progress towards PT goals: Progressing toward goals    Frequency  Min 3X/week    PT Plan Current plan remains appropriate    Co-evaluation             End of Session Equipment  Utilized During Treatment: Gait belt Activity Tolerance: Patient limited by fatigue Patient left: in chair;with call bell/phone within reach;with family/visitor present     Time: [id number] PT Time Calculation (min) (ACUTE ONLY): 18 min  Charges:  $Gait Training: 8-22 mins                    G CodesDuncan Dull April 04, 2015, 2:26 PM Alben Deeds, [place] DPT  [phone]

## 2015-04-02 NOTE — Anesthesia Postprocedure Evaluation (Signed)
Anesthesia Post Note  Patient: [name]  Procedure(s) Performed: Procedure(s) (LRB): IRRIGATION AND DEBRIDEMENT EXTREMITY (Right) ARTHROSCOPY SHOULDER (Right)  Patient location during evaluation: PACU Anesthesia Type: General Level of consciousness: awake and alert and patient cooperative Pain management: pain level controlled Vital Signs Assessment: post-procedure vital signs reviewed and stable Respiratory status: spontaneous breathing and respiratory function stable Cardiovascular status: stable Anesthetic complications: no    Last Vitals:  Filed Vitals:   04/02/15 0600 04/02/15 0800  BP: 145/83 155/86  Pulse: 98 100  Temp:  37.1 C  Resp: 17 24    Last Pain:  Filed Vitals:   04/02/15 0812  PainSc: 0-No pain                 Sedonia Kitner S

## 2015-04-02 NOTE — Progress Notes (Signed)
Subjective: No CP  No SOB  R shoulder sore   Objective: Filed Vitals:   04/02/15 0200 04/02/15 0400 04/02/15 0600 04/02/15 0800  BP: 129/77 146/77 145/83 155/86  Pulse: 89 94 98 100  Temp:  99.4 F (37.4 C)  98.7 F (37.1 C)  TempSrc:  Oral  Oral  Resp: 20 21 17 24   Height:      Weight:      SpO2: 99% 99% 96% 100%   Weight change:   Intake/Output Summary (Last 24 hours) at 04/02/15 0956 Last data filed at 04/02/15 0900  Gross per 24 hour  Intake   1790 ml  Output   1000 ml  Net    790 ml    General: Alert, awake, oriented x3, in no acute distress Neck:  JVP is normal Heart: Regular rate and rhythm, without murmurs, rubs, gallops.  Lungs: Clear to auscultation.  No rales or wheezes. Exemities:  No edema.   Neuro: Grossly intact, nonfocal.  Tele  SR   Lab Results: Results for orders placed or performed during the hospital encounter of 03/27/15 (from the past 24 hour(s))  Glucose, capillary     Status: Abnormal   Collection Time: 04/01/15 12:10 PM  Result Value Ref Range   Glucose-Capillary 168 (H) 65 - 99 mg/dL   Comment 1 Venous Specimen   Glucose, capillary     Status: Abnormal   Collection Time: 04/01/15  6:14 PM  Result Value Ref Range   Glucose-Capillary 140 (H) 65 - 99 mg/dL   Comment 1 Capillary Specimen   Glucose, capillary     Status: Abnormal   Collection Time: 04/01/15  9:17 PM  Result Value Ref Range   Glucose-Capillary 128 (H) 65 - 99 mg/dL   Comment 1 Capillary Specimen   Basic metabolic panel     Status: Abnormal   Collection Time: 04/02/15  3:45 AM  Result Value Ref Range   Sodium 130 (L) 135 - 145 mmol/L   Potassium 3.6 3.5 - 5.1 mmol/L   Chloride 98 (L) 101 - 111 mmol/L   CO2 25 22 - 32 mmol/L   Glucose, Bld 151 (H) 65 - 99 mg/dL   BUN 17 6 - 20 mg/dL   Creatinine, Ser 1.11 (H) 0.44 - 1.00 mg/dL   Calcium 8.0 (L) 8.9 - 10.3 mg/dL   GFR calc non Af Amer 57 (L) >60 mL/min   GFR calc Af Amer >60 >60 mL/min   Anion gap 7 5 - 15  CBC  with Differential/Platelet     Status: Abnormal   Collection Time: 04/02/15  3:45 AM  Result Value Ref Range   WBC 16.0 (H) 4.0 - 10.5 K/uL   RBC 2.67 (L) 3.87 - 5.11 MIL/uL   Hemoglobin 7.3 (L) 12.0 - 15.0 g/dL   HCT 22.0 (L) 36.0 - 46.0 %   MCV 82.4 78.0 - 100.0 fL   MCH 27.3 26.0 - 34.0 pg   MCHC 33.2 30.0 - 36.0 g/dL   RDW 14.5 11.5 - 15.5 %   Platelets 174 150 - 400 K/uL   Neutrophils Relative % 80 %   Lymphocytes Relative 12 %   Monocytes Relative 7 %   Eosinophils Relative 1 %   Basophils Relative 0 %   Neutro Abs 12.8 (H) 1.7 - 7.7 K/uL   Lymphs Abs 1.9 0.7 - 4.0 K/uL   Monocytes Absolute 1.1 (H) 0.1 - 1.0 K/uL   Eosinophils Absolute 0.2 0.0 - 0.7 K/uL  Basophils Absolute 0.0 0.0 - 0.1 K/uL   RBC Morphology POLYCHROMASIA PRESENT    WBC Morphology MILD LEFT SHIFT (1-5% METAS, OCC MYELO, OCC BANDS)   Glucose, capillary     Status: Abnormal   Collection Time: 04/02/15  8:11 AM  Result Value Ref Range   Glucose-Capillary 157 (H) 65 - 99 mg/dL   Comment 1 Capillary Specimen    Comment 2 Venous Specimen     Studies/Results: Ir Fluoro Guide Cv Line Right  04/01/2015  CLINICAL DATA:  50 year old female with a history of septic arthritis. She has been referred for tunneled catheter placement for antibiotics. EXAM: IR RIGHT FLOURO GUIDE CV LINE; IR ULTRASOUND GUIDANCE VASC ACCESS RIGHT Date: 04/01/2015 ANESTHESIA/SEDATION: None Total sedation time: 0 minutes FLUOROSCOPY TIME:  30 second TECHNIQUE: The procedure, risks, benefits, and alternatives were explained to the patient. Questions regarding the procedure were encouraged and answered. The patient understands and consents to the procedure. The right neck and chest was prepped with chlorhexidine, and draped in the usual sterile fashion using maximum barrier technique (cap and mask, sterile gown, sterile gloves, large sterile sheet, hand hygiene and cutaneous antiseptic). Local anesthesia was attained by infiltration with 1%  lidocaine without epinephrine. Ultrasound demonstrated patency of the right internal jugular vein, and this was documented with an image. Under real-time ultrasound guidance, this vein was accessed with a 21 gauge micropuncture needle and image documentation was performed. A small dermatotomy was made at the access site with an 11 scalpel. A 0.018" wire was advanced into the SVC and the access needle exchanged for a 26F micropuncture vascular sheath. The 0.018" wire was then removed and a 0.035" wire advanced into the IVC. Peel-away sheath was placed over the wire, and upon removal of the wire appropriate internal length was obtained/measured. Skin and subcutaneous tissues on the right chest wall just inferior to the clavicle were infiltrated with 1% lidocaine for local anesthesia. Small stab incision was made with 11 blade scalpel. The catheter was back tunneled to the incision at the neck, with the catheter pulled through the subcutaneous tunnel. Catheter was then ligated at the appropriate length at the neck and passed through the peel-away sheath. Peel-away sheath was removed. Final image was stored. Catheter was then sutured onto the right chest wall with retention sutures. The dermatotomy at the venous access site was also closed with Dermabond. Patient tolerated the procedure well and remained hemodynamically stable throughout. No complications were encountered and no significant blood loss was encountered. COMPLICATIONS: None IMPRESSION: Status post placement of right IJ tunneled catheter measuring 24 centimeter. Catheter ready for use. Signed, Dulcy Fanny. Earleen Newport, DO [facility] Radiology Electronically Signed   By: [name] D.O.   On: 04/01/2015 14:45   Ir US Guide Vasc Access Right  04/01/2015  CLINICAL DATA:  50 year old female with a history of septic arthritis. She has been referred for tunneled catheter placement for antibiotics. EXAM: IR RIGHT  FLOURO GUIDE CV LINE; IR ULTRASOUND GUIDANCE VASC ACCESS RIGHT Date: 04/01/2015 ANESTHESIA/SEDATION: None Total sedation time: 0 minutes FLUOROSCOPY TIME:  30 second TECHNIQUE: The procedure, risks, benefits, and alternatives were explained to the patient. Questions regarding the procedure were encouraged and answered. The patient understands and consents to the procedure. The right neck and chest was prepped with chlorhexidine, and draped in the usual sterile fashion using maximum barrier technique (cap and mask, sterile gown, sterile gloves, large sterile sheet, hand hygiene and cutaneous antiseptic). Local anesthesia was attained by  infiltration with 1% lidocaine without epinephrine. Ultrasound demonstrated patency of the right internal jugular vein, and this was documented with an image. Under real-time ultrasound guidance, this vein was accessed with a 21 gauge micropuncture needle and image documentation was performed. A small dermatotomy was made at the access site with an 11 scalpel. A 0.018" wire was advanced into the SVC and the access needle exchanged for a 62F micropuncture vascular sheath. The 0.018" wire was then removed and a 0.035" wire advanced into the IVC. Peel-away sheath was placed over the wire, and upon removal of the wire appropriate internal length was obtained/measured. Skin and subcutaneous tissues on the right chest wall just inferior to the clavicle were infiltrated with 1% lidocaine for local anesthesia. Small stab incision was made with 11 blade scalpel. The catheter was back tunneled to the incision at the neck, with the catheter pulled through the subcutaneous tunnel. Catheter was then ligated at the appropriate length at the neck and passed through the peel-away sheath. Peel-away sheath was removed. Final image was stored. Catheter was then sutured onto the right chest wall with retention sutures. The dermatotomy at the venous access site was also closed with Dermabond. Patient  tolerated the procedure well and remained hemodynamically stable throughout. No complications were encountered and no significant blood loss was encountered. COMPLICATIONS: None IMPRESSION: Status post placement of right IJ tunneled catheter measuring 24 centimeter. Catheter ready for use. Signed, Dulcy Fanny. Earleen Newport, DO [facility] Radiology Electronically Signed   By: [name] D.O.   On: 04/01/2015 14:45    Medications: Reviewed   @PROBHOSP @  1  NSVT  No recurrence  2.  Tachycardia  Improving   3.  HTN  BP is improved  Still increased  This AM  Follow  She was on Losartan 100 and HCTZ as outpt  May need to readd at very low dose  Would hold for now.    4  ID  Continues IV ABX  5  Anemia  Sl drop in hgb   7..3  Note plans for transfusion  Will be availaable as needed  Defer BP adjustments to primary team.     LOS: 6 days   Dorris Carnes 04/02/2015, 9:56 AM

## 2015-04-02 NOTE — Progress Notes (Signed)
PROGRESS NOTE  [name] [id number] DOB: [birth year] DOA: 03/27/2015 PCP: [name], MD   49/F with DM, HTN admitted with sepsis, AKI, found to have GAS bacteremia, R shoulder Septic arthritis, NSVT, ID and Cards following. AKI resolved, R shoulder s/p tap today, gm stain positive, Ortho Dr.Murphy consulted    Assessment/Plan: Sepsis -present at the time of admission -secondary to disseminated Streptococcus pyogenes bacteremia  GAS Bacteremia with Septic Arthritis of R shoulder -Was on IV Daptomycin, now IV Penicillin since 1/16 per ID, patient needs a total of 4 weeks of IV penicillin -had some sore throat prior to admission and son had strep throat 2weeks ago. -ID and Cardiology following Bacteremia Identified as S Pyogenes ID following On ABX , cardiology has cancelled TEE , Can reschedule if blood cultures remain positive , blood cultures 1/17  no growth so far -per ID unlikely to have endocarditis, hence patient did not have TEE, Due to Severe pain and swelling of R shoudler got MRi  and patient found to have septic arthritis  -Arthrocentesis by IR--gram stain GPC in pairs and chains in R shoulder synovial fluid, c/w seeding and septic arthritis from bacteremia (culture neg) -Ortho consulted, d/w Dr.Tim Percell Miller, patient is status post arthroscopy, irrigation and debridement of right shoulder -04/01/2015--infectious disease ordered PICC line--husband refuses--demands for IR to place a tunnel catheter in the IJ due to his concerns of vein sclerosis presumptive need of dialysis although the patient has normal renal function at this time Patient now has right IJ tunneled cuffed hickman   Right lower lobe infiltrate/HCAP? -Patient is small to moderate-sized pleural effusion, possible right basilar air space opacity, atelectasis versus pneumonia,   white count improving 21.2 > 16 Patient currently on  penicillin G Based on the chest x-ray Dr.[name] , has  not recommended any changes in her antibiotic treatment PCCM also consulted during this admission, no evidence of pleural effusion on ultrasound Family requested transfer to [place], discussed with hospitalist (Dr Beckey Downing) there who has not accepted patient as they are on diversion due to unavailability of beds , husband aware that patient has not been accepted due to no medical indication for transfer  ABG 7.47, 29.7, 102  AKI  -Resolved -due to sepsis, poor Po intake, NSAIDs, ARB -Baseline creatinine thought to be within normal limits, on admit creatinine 5.26 >1.11 -Nephrology consulted. Ultrasounds of abdomen and pelvis showed signs of medical renal disease and no signs of obstruction. -creatinine normalized, Renal following, IVF stopped  R shoulder Septic arthritis -see above -Secondary to disseminated Streptococcus biology needs bacteremia  DM2--controlled -metformin on hold, SSI, Hbaic is 7  Nonsustained ventricular tachycardia -had an episode of 30beats 1/15 pm -continue metoprolol, replaced K and Mag -2d ECHO with normal EF and wall motion;  no structural abnormalities -Cards following  Polyarthritis -See discussion on R shoulder septic arthritis as above, has inflammation of R elbow, wrist, L shoulder, elbow, wrist, much less pronounced than R shoulder -unable to use NSAIDs and steroids in setting of infection and acute kidney injury  Elevated Bili -Secondary to sepsis and cholestasis, ALP, AST/ALT WNL -improving  HTN -stable, holding ARB due to AKI\ -Elevation partly due to pain -Metoprolol tartrate  25 mg every 6 hours, amlodipine added  Elevated B-HCG,  -she is not pregnant, LMP>1year ago, menopausal now, Pelvic US negative for gestational sac -suspect this could be from her pituitary gland, needs to be followed, GYN As outpatient  Anemia  of chronic disease,  -hemoglobin 7.9, >7.3 Status post 1 unit of packed red blood cells, will receive another unit  today, continue to check FOBT Anemia panel   Family Communication:   Husband updated at beside Disposition Plan:   Home when medically stable       Procedures/Studies: US Soft Tissue Head/neck  03/27/2015  CLINICAL DATA:  Left-sided neck pain EXAM: ULTRASOUND LEFT NECK TECHNIQUE: Longitudinal and transverse images of the left neck region in the area of pain performed COMPARISON:  None. FINDINGS: Sonographic images of the left neck show no mass or inflammatory focus. No abnormal fluid collection. In particular, no abscess. Several tiny lymph nodes are noted in this area; by size criteria, there is no adenopathy in this area. Vascular structures of this area are widely patent. IMPRESSION: Scattered subcentimeter lymph nodes in the left neck, not meeting size criteria for pathologic significance. No mass or inflammatory focus appreciable. No abscess in particular. Electronically Signed   By: Lowella Grip III M.D.   On: 03/27/2015 17:19   [place] Chest  03/31/2015  CLINICAL DATA:  50 year old female with chest x-ray demonstrating right-sided small pleural effusion. EXAM: CHEST ULTRASOUND COMPARISON:  None. FINDINGS: Ultrasound survey of the right chest demonstrating small complex pleural effusion, not amenable to ultrasound-guided drainage. IMPRESSION: Small, complex appearing right pleural effusion not amenable for ultrasound-guided thoracentesis. Signed, Dulcy Fanny. Earleen Newport, DO [facility] Radiology Electronically Signed   By: [name] D.O.   On: 03/31/2015 11:05   US Abdomen Complete  03/27/2015  CLINICAL DATA:  Septic, generalized upper and lower abdominal pain, positive pregnancy test EXAM: ABDOMEN ULTRASOUND COMPLETE COMPARISON:  None FINDINGS: Gallbladder: Dependent echogenic material with minimal shadowing likely calculi in gallbladder. No gallbladder wall thickening, pericholecystic fluid, or sonographic Murphy sign. Common bile duct:  Diameter: Normal caliber 4 mm diameter Liver: Normal appearance IVC: Normal appearance Pancreas: Distal tail obscured by bowel gas. Visualized portion normal appearance. Spleen: Normal appearance, 8.5 cm length Right Kidney: Length: 12.5 cm. Increased cortical echogenicity. Normal cortical thickness. No mass or hydronephrosis. Left Kidney: Length: 12.7 cm. Increased cortical echogenicity. Normal cortical thickness. No mass or hydronephrosis. Abdominal aorta: Normal caliber Other findings: No free fluid IMPRESSION: Probable medical renal disease changes. Probable cholelithiasis without evidence acute cholecystitis. Remainder of exam unremarkable. Electronically Signed   By: Lavonia Dana M.D.   On: 03/27/2015 17:29   US Ob Comp Less 14 Wks  03/27/2015  CLINICAL DATA:  Low beta HCG of 27. Abdominal pain with sepsis. Elevated white blood cell count. EXAM: OBSTETRIC <14 WK [place] AND TRANSVAGINAL OB US TECHNIQUE: Both transabdominal and transvaginal ultrasound examinations were performed for complete evaluation of the gestation as well as the maternal uterus, adnexal regions, and pelvic cul-de-sac. Transvaginal technique was performed to assess early pregnancy. COMPARISON:  Ultrasound 02/13/2012 FINDINGS: Intrauterine gestational sac: None Yolk sac:  Not present Embryo:  Not present Maternal uterus/adnexae: The uterus is lobular and heterogeneous echotexture consistent multiple leiomyoma. This appears similar to the ultrasound of 2013. The endometrium is difficult to identified on the background of the multiple leiomyoma but measures 8 mm. Additionally, the ovaries are not identified. There is reported history of a LEFT oophorectomy in 2008. No free fluid. IMPRESSION: 1. Leiomyomatous uterus similar to 2013. 2. No decidual reaction or gestational sac identified. 3. No intrauterine gestational sac, yolk sac, or fetal pole identified. Differential considerations include intrauterine pregnancy too early to be sonographically  visualized, missed abortion, or ectopic pregnancy. Followup  ultrasound is recommended in 10-14 days for further evaluation. Electronically Signed   By: Suzy Bouchard M.D.   On: 03/27/2015 18:07   US Ob Transvaginal  03/27/2015  CLINICAL DATA:  Low beta HCG of 27. Abdominal pain with sepsis. Elevated white blood cell count. EXAM: OBSTETRIC <14 WK [place] AND TRANSVAGINAL OB US TECHNIQUE: Both transabdominal and transvaginal ultrasound examinations were performed for complete evaluation of the gestation as well as the maternal uterus, adnexal regions, and pelvic cul-de-sac. Transvaginal technique was performed to assess early pregnancy. COMPARISON:  Ultrasound 02/13/2012 FINDINGS: Intrauterine gestational sac: None Yolk sac:  Not present Embryo:  Not present Maternal uterus/adnexae: The uterus is lobular and heterogeneous echotexture consistent multiple leiomyoma. This appears similar to the ultrasound of 2013. The endometrium is difficult to identified on the background of the multiple leiomyoma but measures 8 mm. Additionally, the ovaries are not identified. There is reported history of a LEFT oophorectomy in 2008. No free fluid. IMPRESSION: 1. Leiomyomatous uterus similar to 2013. 2. No decidual reaction or gestational sac identified. 3. No intrauterine gestational sac, yolk sac, or fetal pole identified. Differential considerations include intrauterine pregnancy too early to be sonographically visualized, missed abortion, or ectopic pregnancy. Followup ultrasound is recommended in 10-14 days for further evaluation. Electronically Signed   By: Suzy Bouchard M.D.   On: 03/27/2015 18:07   Mr Shoulder Right Wo Contrast  03/30/2015  CLINICAL DATA:  Joint pain most severe in the right shoulder. Sepsis. EXAM: MRI OF THE RIGHT SHOULDER WITHOUT CONTRAST TECHNIQUE: Multiplanar, multisequence MR imaging of the shoulder was performed. No intravenous contrast was administered. COMPARISON:  03/27/2015 FINDINGS: Rotator  cuff: Prominent partial thickness articular surface tearing of the supraspinatus, with the articular surface portion retracted 1.7 cm on image 16 series 5, compatible with tendon delamination. Moderate tendinopathy of the remaining supraspinatus tendon. Similarly there is partial thickness articular surface tearing of the infraspinatus tendon with moderate infraspinatus tendinopathy. Moderate subscapularis tendinopathy is present along with potential partial thickness articular surface tearing Muscles: Abnormal edema tracks primarily along the marginal portions of the supraspinatus, subscapularis, teres minor, and teres major muscles. There is also low-level edema along the superficial fascia margin of the lateral deltoid. Biceps long head: Partial tearing or advanced tendinopathy of the intra-articular segment. Acromioclavicular Joint: Mild degenerative AC joint arthropathy. Subacromial morphology is type 2 (curved). Small but abnormal amount of fluid in the subacromial subdeltoid bursa. Glenohumeral Joint: Prominent glenohumeral joint effusion. Moderate degenerative chondral thinning along the glenohumeral joint. Labrum: Linear irregularity in the superior labrum with a blunted appearance, compatible with SLAP tear extending into the biceps anchor. Bones: Unusual flaring of the proximal humeral metadiaphysis, query prior fracture. Small right axillary lymph nodes are observed. IMPRESSION: 1. Large shoulder joint effusion with abnormal edema tracking within and along the margins of the regional musculature. In the setting of sepsis and new onset shoulder pain, there is a high degree of concern for septic glenohumeral joint, and arthrocentesis is likely indicated. 2. Partial thickness articular surface tearing of the supraspinatus and infraspinatus with tendon delamination. There is potentially mild partial thickness articular surface tearing of the subscapularis. Considerable tendinopathy of these tendons as well.  3. Advanced tendinopathy or partial tearing of the long head of the biceps. 4. Increased signal in the superior labrum compatible with SLAP tear extending into the biceps anchor. 5. Subacromial subdeltoid bursitis. 6. Somewhat unusual flaring of the proximal humeral metadiaphysis, query prior fracture. 7. Moderate degenerative glenohumeral arthropathy. These results will be called  to the ordering clinician or representative by the Radiologist Assistant, and communication documented in the PACS or zVision Dashboard. Electronically Signed   By: Van Clines M.D.   On: 03/30/2015 07:28   Ir Fluoro Guide Cv Line Right  04/01/2015  CLINICAL DATA:  50 year old female with a history of septic arthritis. She has been referred for tunneled catheter placement for antibiotics. EXAM: IR RIGHT FLOURO GUIDE CV LINE; IR ULTRASOUND GUIDANCE VASC ACCESS RIGHT Date: 04/01/2015 ANESTHESIA/SEDATION: None Total sedation time: 0 minutes FLUOROSCOPY TIME:  30 second TECHNIQUE: The procedure, risks, benefits, and alternatives were explained to the patient. Questions regarding the procedure were encouraged and answered. The patient understands and consents to the procedure. The right neck and chest was prepped with chlorhexidine, and draped in the usual sterile fashion using maximum barrier technique (cap and mask, sterile gown, sterile gloves, large sterile sheet, hand hygiene and cutaneous antiseptic). Local anesthesia was attained by infiltration with 1% lidocaine without epinephrine. Ultrasound demonstrated patency of the right internal jugular vein, and this was documented with an image. Under real-time ultrasound guidance, this vein was accessed with a 21 gauge micropuncture needle and image documentation was performed. A small dermatotomy was made at the access site with an 11 scalpel. A 0.018" wire was advanced into the SVC and the access needle exchanged for a 29F micropuncture vascular sheath. The 0.018" wire was then  removed and a 0.035" wire advanced into the IVC. Peel-away sheath was placed over the wire, and upon removal of the wire appropriate internal length was obtained/measured. Skin and subcutaneous tissues on the right chest wall just inferior to the clavicle were infiltrated with 1% lidocaine for local anesthesia. Small stab incision was made with 11 blade scalpel. The catheter was back tunneled to the incision at the neck, with the catheter pulled through the subcutaneous tunnel. Catheter was then ligated at the appropriate length at the neck and passed through the peel-away sheath. Peel-away sheath was removed. Final image was stored. Catheter was then sutured onto the right chest wall with retention sutures. The dermatotomy at the venous access site was also closed with Dermabond. Patient tolerated the procedure well and remained hemodynamically stable throughout. No complications were encountered and no significant blood loss was encountered. COMPLICATIONS: None IMPRESSION: Status post placement of right IJ tunneled catheter measuring 24 centimeter. Catheter ready for use. Signed, Dulcy Fanny. Earleen Newport, DO [facility] Radiology Electronically Signed   By: [name] D.O.   On: 04/01/2015 14:45   Ir US Guide Vasc Access Right  04/01/2015  CLINICAL DATA:  50 year old female with a history of septic arthritis. She has been referred for tunneled catheter placement for antibiotics. EXAM: IR RIGHT FLOURO GUIDE CV LINE; IR ULTRASOUND GUIDANCE VASC ACCESS RIGHT Date: 04/01/2015 ANESTHESIA/SEDATION: None Total sedation time: 0 minutes FLUOROSCOPY TIME:  30 second TECHNIQUE: The procedure, risks, benefits, and alternatives were explained to the patient. Questions regarding the procedure were encouraged and answered. The patient understands and consents to the procedure. The right neck and chest was prepped with chlorhexidine, and draped in the usual sterile fashion using  maximum barrier technique (cap and mask, sterile gown, sterile gloves, large sterile sheet, hand hygiene and cutaneous antiseptic). Local anesthesia was attained by infiltration with 1% lidocaine without epinephrine. Ultrasound demonstrated patency of the right internal jugular vein, and this was documented with an image. Under real-time ultrasound guidance, this vein was accessed with a 21 gauge micropuncture needle and image documentation was performed. A  small dermatotomy was made at the access site with an 11 scalpel. A 0.018" wire was advanced into the SVC and the access needle exchanged for a 3F micropuncture vascular sheath. The 0.018" wire was then removed and a 0.035" wire advanced into the IVC. Peel-away sheath was placed over the wire, and upon removal of the wire appropriate internal length was obtained/measured. Skin and subcutaneous tissues on the right chest wall just inferior to the clavicle were infiltrated with 1% lidocaine for local anesthesia. Small stab incision was made with 11 blade scalpel. The catheter was back tunneled to the incision at the neck, with the catheter pulled through the subcutaneous tunnel. Catheter was then ligated at the appropriate length at the neck and passed through the peel-away sheath. Peel-away sheath was removed. Final image was stored. Catheter was then sutured onto the right chest wall with retention sutures. The dermatotomy at the venous access site was also closed with Dermabond. Patient tolerated the procedure well and remained hemodynamically stable throughout. No complications were encountered and no significant blood loss was encountered. COMPLICATIONS: None IMPRESSION: Status post placement of right IJ tunneled catheter measuring 24 centimeter. Catheter ready for use. Signed, Dulcy Fanny. Earleen Newport, DO [facility] Radiology Electronically Signed   By: [name] D.O.   On: 04/01/2015 14:45   Dg Chest Port 1  View  03/31/2015  CLINICAL DATA:  Acute onset of shortness of breath. Initial encounter. EXAM: PORTABLE CHEST 1 VIEW COMPARISON:  Chest radiograph from 03/27/2015 FINDINGS: A small to moderate right-sided pleural effusion is noted. Right basilar airspace opacity may reflect atelectasis or pneumonia. Asymmetric interstitial edema could have a similar appearance. Underlying vascular congestion is seen. No pneumothorax is identified. The cardiomediastinal silhouette is borderline enlarged. No acute osseous abnormalities are seen. IMPRESSION: 1. Small to moderate right-sided pleural effusion noted. Right basilar airspace opacity may reflect atelectasis or pneumonia. Asymmetric interstitial edema could have a similar appearance. 2. Underlying vascular congestion and borderline cardiomegaly. Electronically Signed   By: Garald Balding M.D.   On: 03/31/2015 01:49   Dg Chest Port 1 View  03/27/2015  CLINICAL DATA:  Fever for 3 days EXAM: PORTABLE CHEST 1 VIEW COMPARISON:  None. FINDINGS: Lungs are clear. Heart is upper normal in size with pulmonary vascularity within normal limits. No adenopathy. No bone lesions. IMPRESSION: No edema or consolidation. Electronically Signed   By: Lowella Grip III M.D.   On: 03/27/2015 15:43   Dg Fluoro Guided Needle Plc Aspiration/injection Loc  03/30/2015  CLINICAL DATA:  Prior MRI suspicious for septic glenohumeral joint. Sepsis. EXAM: FLUORO GUIDED NEEDLE PLACEMENT FLUOROSCOPY TIME:  If the device does not provide the exposure index: Fluoroscopy Time (in minutes and seconds):  1 minutes and 42 seconds Number of Acquired Images:  None COMPARISON:  MRI of 03/29/2015. FINDINGS: Informed written and verbal consent were obtained. Risks and benefits of the procedure were discussed. A "Time out" was performed. The superior medial portion of the right humeral head was localized under fluoroscopic guidance. Skin was prepped and draped in standard sterile fashion. Skin was numbed with 1%  lidocaine. A 20 gauge needle was inserted into the joint. No fluid could be aspirated. Intra-articular location was confirmed with approximately 6 cc of Omnipaque 180. Subsequent, approximately 5 cc of saline were injected. Only minimal fluid could be returned. IMPRESSION: Uncomplicated aspiration of the right shoulder, as detailed above. Minimal fluid was obtained, and sent to laboratory as requested. Electronically Signed   By: Marylyn Ishihara  Jobe Igo M.D.   On: 03/30/2015 12:11        Subjective: Patient continues to complain of pain in the right shoulder, right elbow as well as intermittent pain in the left upper extremity. No BM for several days  Objective: Filed Vitals:   04/02/15 0200 04/02/15 0400 04/02/15 0600 04/02/15 0800  BP: 129/77 146/77 145/83 155/86  Pulse: 89 94 98 100  Temp:  99.4 F (37.4 C)  98.7 F (37.1 C)  TempSrc:  Oral  Oral  Resp: 20 21 17 24   Height:      Weight:      SpO2: 99% 99% 96% 100%    Intake/Output Summary (Last 24 hours) at 04/02/15 0824 Last data filed at 04/02/15 0700  Gross per 24 hour  Intake   1770 ml  Output   1000 ml  Net    770 ml   Weight change:  Exam:   General:  Pt is alert, follows commands appropriately, not in acute distress  HEENT: No icterus, No thrush, No neck mass, [place]/AT  Cardiovascular: RRR, S1/S2, no rubs, no gallops  Respiratory: Bibasilar crackles, L>R. No wheeze.  Abdomen: Soft/+BS, non tender, non distended, no guarding  Extremities: No edema, No lymphangitis, No petechiae, No rashes, no synovitis  Data Reviewed: Basic Metabolic Panel:  Recent Labs Lab 03/28/15 0015 03/28/15 0944 03/28/15 1125 03/29/15 0624 03/30/15 0715 03/31/15 0105 04/01/15 0237 04/02/15 0345  NA 137 138 154* 134* 131* 133* 131* 130*  K 3.7 3.9 4.3 3.4* 3.8 3.8 3.7 3.6  CL 104 105 114* 106 100* 102 101 98*  CO2 20* 20* 19* 20* 21* 21* 20* 25  GLUCOSE 141* 135* 139* 198* 179* 196* 214* 151*  BUN 41* 31* 28* 19 15 16 18 17     CREATININE 2.52* 1.72* 1.77* 1.08* 0.84 0.95 1.09* 1.11*  CALCIUM 7.1* 7.7* 7.7* 8.2* 8.6* 8.5* 7.9* 8.0*  MG  --   --  1.7  --   --   --   --   --   PHOS 3.6 2.9  --   --   --   --   --   --    Liver Function Tests:  Recent Labs Lab 03/28/15 1125 03/29/15 0624 03/30/15 0715 03/31/15 0105 04/01/15 0237  AST 38 32 52* 36 25  ALT 27 27 24 22  12*  ALKPHOS 107 137* 161* 114 103  BILITOT 3.1* 4.0* 2.1* 1.2 1.3*  PROT 6.3* 6.5 6.2* 6.0* 5.9*  ALBUMIN 2.3* 2.2* 1.8* 1.7* 1.6*    Recent Labs Lab 03/27/15 1152  LIPASE 22    Recent Labs Lab 03/31/15 0105  AMMONIA 49*   CBC:  Recent Labs Lab 03/27/15 1152  03/29/15 0624 03/30/15 0715 03/31/15 0105 04/01/15 0237 04/02/15 0345  WBC 21.9*  < > 17.0* 22.5* 21.2* 19.3* 16.0*  NEUTROABS 21.1*  --   --   --   --   --  12.8*  HGB 11.4*  < > 9.3* 8.2* 7.9* 7.9* 7.3*  HCT 33.6*  < > 27.9* 24.4* 23.6* 23.2* 22.0*  MCV 82.0  < > 81.3 81.3 81.1 80.6 82.4  PLT 176  < > 168 193 177 169 174  < > = values in this interval not displayed. Cardiac Enzymes:  Recent Labs Lab 03/28/15 0943  CKTOTAL 179   BNP: Invalid input(s): POCBNP CBG:  Recent Labs Lab 03/31/15 2108 04/01/15 0733 04/01/15 1210 04/01/15 1814 04/01/15 2117  GLUCAP 238* 245* 168* 140* 128*  Recent Results (from the past 240 hour(s))  Urine culture     Status: None   Collection Time: 03/27/15 12:42 PM  Result Value Ref Range Status   Specimen Description URINE, CLEAN CATCH  Final   Special Requests Normal  Final   Culture   Final    >=100,000 COLONIES/mL GROUP A STREP (S.PYOGENES) ISOLATED   Report Status 03/29/2015 FINAL  Final  Culture, blood (Routine X 2) w Reflex to ID Panel     Status: None   Collection Time: 03/27/15  2:25 PM  Result Value Ref Range Status   Specimen Description BLOOD LEFT ANTECUBITAL  Final   Special Requests BOTTLES DRAWN AEROBIC ONLY 5CC  Final   Culture  Setup Time   Final    GRAM POSITIVE COCCI IN PAIRS IN  CHAINS AEROBIC BOTTLE ONLY CRITICAL RESULT CALLED TO, READ BACK BY AND VERIFIED WITH: A. MINTZ,RN AT 0738 ON [id number] BY Rhea Bleacher    Culture   Final    GROUP A STREP (S.PYOGENES) ISOLATED SUSCEPTIBILITIES PERFORMED ON PREVIOUS CULTURE WITHIN THE LAST 5 DAYS.    Report Status 03/30/2015 FINAL  Final  Culture, blood (Routine X 2) w Reflex to ID Panel     Status: None   Collection Time: 03/27/15  3:00 PM  Result Value Ref Range Status   Specimen Description BLOOD RIGHT HAND  Final   Special Requests BOTTLES DRAWN AEROBIC AND ANAEROBIC 5CC  Final   Culture  Setup Time   Final    GRAM POSITIVE COCCI IN PAIRS IN CHAINS IN BOTH AEROBIC AND ANAEROBIC BOTTLES CRITICAL RESULT CALLED TO, READ BACK BY AND VERIFIED WITH: A. MINTZ,RN AT 0848 ON [id number] BY Rhea Bleacher    Culture GROUP A STREP (S.PYOGENES) ISOLATED  Final   Report Status 03/30/2015 FINAL  Final   Organism ID, Bacteria GROUP A STREP (S.PYOGENES) ISOLATED  Final      Susceptibility   Group a strep (s.pyogenes) isolated - MIC*    CLINDAMYCIN >=1 RESISTANT Resistant     AMPICILLIN <=0.25 SENSITIVE Sensitive     ERYTHROMYCIN >=8 RESISTANT Resistant     VANCOMYCIN <=0.12 SENSITIVE Sensitive     CEFTRIAXONE <=0.12 SENSITIVE Sensitive     LEVOFLOXACIN <=0.25 SENSITIVE Sensitive     * GROUP A STREP (S.PYOGENES) ISOLATED  Culture, blood (routine x 2)     Status: None (Preliminary result)   Collection Time: 03/28/15  3:00 PM  Result Value Ref Range Status   Specimen Description BLOOD RIGHT HAND  Final   Special Requests BOTTLES DRAWN AEROBIC AND ANAEROBIC 5CC  Final   Culture NO GROWTH 4 DAYS  Final   Report Status PENDING  Incomplete  Culture, blood (routine x 2)     Status: None (Preliminary result)   Collection Time: 03/28/15  3:05 PM  Result Value Ref Range Status   Specimen Description BLOOD RIGHT HAND  Final   Special Requests BOTTLES DRAWN AEROBIC ONLY 8.5CC  Final   Culture NO GROWTH 4 DAYS  Final   Report Status PENDING   Incomplete  Gram stain     Status: None   Collection Time: 03/30/15 10:52 AM  Result Value Ref Range Status   Specimen Description FLUID SYNOVIAL RIGHT SHOULDER  Final   Special Requests NONE  Final   Gram Stain   Final    MODERATE WBC PRESENT, PREDOMINANTLY PMN FEW GRAM POSITIVE COCCI IN PAIRS IN CHAINS    Report Status 03/30/2015 FINAL  Final  Fungus Culture with  Smear     Status: None (Preliminary result)   Collection Time: 03/30/15 10:52 AM  Result Value Ref Range Status   Specimen Description FLUID SYNOVIAL RIGHT SHOULDER  Final   Special Requests NONE  Final   Fungal Smear   Final    NO YEAST OR FUNGAL ELEMENTS SEEN Performed at [facility]    Culture   Final    CULTURE IN PROGRESS FOR FOUR WEEKS Performed at [facility]    Report Status PENDING  Incomplete  Anaerobic culture     Status: None (Preliminary result)   Collection Time: 03/30/15  7:42 PM  Result Value Ref Range Status   Specimen Description SYNOVIAL RIGHT SHOULDER  Final   Special Requests PATIENT ON FOLLOWING PENICILLIN  Final   Gram Stain   Final    ABUNDANT WBC PRESENT, PREDOMINANTLY PMN NO ORGANISMS SEEN    Culture   Final    NO ANAEROBES ISOLATED; CULTURE IN PROGRESS FOR 5 DAYS   Report Status PENDING  Incomplete  Body fluid culture     Status: None (Preliminary result)   Collection Time: 03/30/15  7:43 PM  Result Value Ref Range Status   Specimen Description SYNOVIAL RIGHT SHOULDER  Final   Special Requests PATIENT ON FOLLOWING PENICILLIN  Final   Gram Stain   Final    ABUNDANT WBC PRESENT, PREDOMINANTLY PMN NO ORGANISMS SEEN    Culture NO GROWTH 2 DAYS  Final   Report Status PENDING  Incomplete  MRSA PCR Screening     Status: None   Collection Time: 03/31/15  1:55 AM  Result Value Ref Range Status   MRSA by PCR NEGATIVE NEGATIVE Final    Comment:        The GeneXpert MRSA Assay (FDA approved for NASAL specimens only), is one component of a comprehensive MRSA  colonization surveillance program. It is not intended to diagnose MRSA infection nor to guide or monitor treatment for MRSA infections.      Scheduled Meds: . amLODipine  5 mg Oral BID  . docusate sodium  100 mg Oral BID  . insulin aspart  0-9 Units Subcutaneous TID WC  . insulin aspart  3 Units Subcutaneous TID WC  . metoprolol tartrate  25 mg Oral QID  . midazolam  2 mg Intravenous Once  . pencillin G potassium IV  4 Million Units Intravenous Q4H  . senna  2 tablet Oral Daily   Continuous Infusions: . sodium chloride 10 mL/hr at 04/01/15 2125  . lactated ringers 10 mL/hr at 03/30/15 1543     Reyne Dumas, M.D. [facility] Pager [phone]  If 7PM-7AM, please contact night-coverage [email or web address] Password [email or web address] 04/02/2015, 8:24 AM   LOS: 6 days

## 2015-04-02 NOTE — Progress Notes (Addendum)
     Subjective:  POD#3 I/D of R shoulder.  Patient reports pain as mild to moderate.  Resting comfortably in bed this morning.  Patient requesting to ambulate today.  Will have PT come by to work ROM of R shoulder and gait training. Patient had IJ inserted yesterday for abx on d/c.  No growth of cultures X2 days.  Objective:   VITALS:   Filed Vitals:   04/02/15 0023 04/02/15 0200 04/02/15 0400 04/02/15 0600  BP:  129/77 146/77 145/83  Pulse:  89 94 98  Temp: 99 F (37.2 C)  99.4 F (37.4 C)   TempSrc: Oral  Oral   Resp:  20 21 17   Height:      Weight:      SpO2:  99% 99% 96%    Neurologically intact ABD soft Neurovascular intact Sensation intact distally Intact pulses distally Incision: dressing C/D/I   Lab Results  Component Value Date   WBC 16.0* 04/02/2015   HGB 7.3* 04/02/2015   HCT 22.0* 04/02/2015   MCV 82.4 04/02/2015   PLT 174 04/02/2015   BMET    Component Value Date/Time   NA 130* 04/02/2015 0345   K 3.6 04/02/2015 0345   CL 98* 04/02/2015 0345   CO2 25 04/02/2015 0345   GLUCOSE 151* 04/02/2015 0345   BUN 17 04/02/2015 0345   CREATININE 1.11* 04/02/2015 0345   CALCIUM 8.0* 04/02/2015 0345   GFRNONAA 57* 04/02/2015 0345   GFRAA >60 04/02/2015 0345     Assessment/Plan: 3 Days Post-Op   Principal Problem:   Streptococcal bacteremia Active Problems:   Acute renal failure (ARF) (HCC)   Abdominal pain, vomiting, and diarrhea   Posterior cervical adenopathy   Leukocytosis   Diabetes mellitus (HCC)   Hypertension   Polyarthralgia   NSVT (nonsustained ventricular tachycardia) (HCC)   Sinus tachycardia (HCC)   Neck pain on left side   Arthritis   Pleural effusion right with thoracentesis 1/18   Acute respiratory failure with hypoxia (HCC)   Confusion, postoperative   Essential hypertension   Up with therapy WBAT in the RUE Will be allowed to get portal sites wet in shower starting tomorrow, but may have to hold on showering due to IJ  insertion.   Continue abx per ID OK from ortho standpoint to discharge once stable from medicine standpoint.  We will see back in the office in 7 days for recheck.    [name] Lelan Pons 04/02/2015, 8:06 AM Cell [phone]

## 2015-04-03 LAB — BODY FLUID CULTURE: CULTURE: NO GROWTH

## 2015-04-03 LAB — GLUCOSE, CAPILLARY
GLUCOSE-CAPILLARY: 195 mg/dL — AB (ref 65–99)
Glucose-Capillary: 190 mg/dL — ABNORMAL HIGH (ref 65–99)
Glucose-Capillary: 185 mg/dL — ABNORMAL HIGH (ref 65–99)
Glucose-Capillary: 181 mg/dL — ABNORMAL HIGH (ref 65–99)

## 2015-04-03 LAB — TYPE AND SCREEN
ABO/RH(D): O POS
Antibody Screen: NEGATIVE
Unit division: 0
Unit division: 0

## 2015-04-03 LAB — CBC
HCT: 25.4 % — ABNORMAL LOW (ref 36.0–46.0)
Hemoglobin: 8.9 g/dL — ABNORMAL LOW (ref 12.0–15.0)
MCH: 28.3 pg (ref 26.0–34.0)
MCHC: 35 g/dL (ref 30.0–36.0)
MCV: 80.9 fL (ref 78.0–100.0)
PLATELETS: 169 10*3/uL (ref 150–400)
RBC: 3.14 MIL/uL — AB (ref 3.87–5.11)
RDW: 14.4 % (ref 11.5–15.5)
WBC: 15 10*3/uL — AB (ref 4.0–10.5)

## 2015-04-03 LAB — COMPREHENSIVE METABOLIC PANEL
ALBUMIN: 1.4 g/dL — AB (ref 3.5–5.0)
ALT: 13 U/L — AB (ref 14–54)
AST: 25 U/L (ref 15–41)
Alkaline Phosphatase: 80 U/L (ref 38–126)
Anion gap: 11 (ref 5–15)
BUN: 21 mg/dL — AB (ref 6–20)
CHLORIDE: 96 mmol/L — AB (ref 101–111)
CO2: 20 mmol/L — AB (ref 22–32)
CREATININE: 1.06 mg/dL — AB (ref 0.44–1.00)
Calcium: 7.9 mg/dL — ABNORMAL LOW (ref 8.9–10.3)
GFR calc Af Amer: >60 mL/min (ref >60)
GFR calc non Af Amer: >60 mL/min (ref >60)
GLUCOSE: 248 mg/dL — AB (ref 65–99)
POTASSIUM: 3.9 mmol/L (ref 3.5–5.1)
SODIUM: 127 mmol/L — AB (ref 135–145)
Total Bilirubin: 1.1 mg/dL (ref 0.3–1.2)
Total Protein: 6.3 g/dL — ABNORMAL LOW (ref 6.5–8.1)

## 2015-04-03 MED ORDER — ZOLPIDEM TARTRATE 5 MG PO TABS
5.0000 mg | ORAL_TABLET | Freq: Every evening | ORAL | Status: DC | PRN
  Administered 2015-04-03: 5 mg via ORAL
  Filled 2015-04-03 (×2): qty 1

## 2015-04-03 MED ORDER — PENICILLIN G POTASSIUM 5000000 UNITS IJ SOLR
4.0000 10*6.[IU] | INTRAVENOUS | Status: DC
  Administered 2015-04-03 – 2015-04-07 (×27): 4 10*6.[IU] via INTRAVENOUS
  Filled 2015-04-03 (×31): qty 4

## 2015-04-03 NOTE — Progress Notes (Addendum)
PROGRESS NOTE  [name] [id number] DOB: 11-13-1965 DOA: 03/27/2015 PCP: [name], MD   [name] is a 50 y.o. female with hx of DM (no meds) , HTN and uterine fibroids who had sudden onset of rigors 2 days ago followed by epigastric pain, nausea, vomiting and mild diarrhea. She then began to have severe left-sided posterior neck pain leading her to come to the hospital yesterday. She had low-grade fever of 100.5 and acute renal insufficiency. Her chest x-ray was negative. Abdominal ultrasound was normal with the exception of questionable cholelithiasis. Her urinalysis did show some pyuria but she has not been having any urinary tract symptoms. She was started on empiric daptomycin and piperacillin tazobactam (vancomycin was avoided because of concerns about potential nephrotoxicity). Both admission blood cultures are already growing gram-positive cocci in pairs and chains. Her abdominal pain, nausea, vomiting and diarrhea have completely resolved. She continues to have left-sided neck pain but it is slightly better. Over the past 24 hours she has developed joint pains. It is most severe in her right shoulder, right elbow and right wrist but she also has some milder pain in her left shoulder and right knee.   Assessment/Plan: Sepsis -present at the time of admission Disseminated GAS bacteremia - on penicillin and WBC improved, seems to be improving overall.  Treat with penicillin (if available in a pump) for 4 weeks total from surgery 1/17 through February 13th.  Weekly cmp, cbc to [facility] Infectious disease will arrange follow up in [facility] in about 2 weeks  GAS Bacteremia with Septic Arthritis of R shoulder -Was on IV Daptomycin, now IV Penicillin since 1/16 per ID, patient needs a total of 4 weeks of IV penicillin as above -had some sore throat prior to admission and son had strep throat 2weeks ago. -ID and Cardiology were consulted for bacteremia Bacteremia  Identified as [place] cardiology was consulted for possible TEE, however this was canceled per ID recommendations , Can reschedule if blood cultures remain positive , blood cultures from 1/17  no growth so far -per ID unlikely to have endocarditis, hence patient did not have TEE,  Due to Severe pain and swelling of R shoudler got MRi  and patient found to have septic arthritis  -Arthrocentesis by IR--gram stain GPC in pairs and chains in R shoulder synovial fluid, c/w seeding and septic arthritis from bacteremia (culture neg) -Ortho consulted, d/w Dr.Tim Percell Miller, patient is status post arthroscopy, irrigation and debridement of right shoulder on 1/17 -04/01/2015--infectious disease ordered PICC line--husband requested IR to place a tunnel catheter in the IJ due to his concerns of vein sclerosis presumptive need of dialysis although the patient has normal renal function at this time Patient now has right IJ tunneled cuffed hickman placement interventional radiology [name], DO recommended  not submerge right IJ for 7 days  Anemia of chronic disease Patient has received a total of 2 units of packed red blood cells during this admission Follow CBC Anemia panel shows iron deficiency and the patient has been started on iron supplementation  Right lower lobe infiltrate/HCAP? -Patient is small to moderate-sized pleural effusion, possible right basilar air space opacity, atelectasis versus pneumonia,   white count improving 21.2 > 15 Patient currently on  penicillin G Based on the chest x-ray Dr.[name] , has not recommended any changes in her antibiotic treatment PCCM also consulted during this admission, no evidence of pleural effusion on ultrasound. According to the recommendation Continue  abx for bacteremia as per ID. No need for any additional coverage Family requested transfer to [place], discussed with hospitalist (Dr Beckey Downing) there who has not accepted patient as they are on diversion  due to unavailability of beds , husband aware that patient has not been accepted due to no medical indication for transfer  ABG 7.47, 29.7, 102  AKI  -Resolved -due to sepsis, poor Po intake, NSAIDs, ARB -Baseline creatinine thought to be within normal limits, on admit creatinine 5.26 >1.11 -Nephrology consulted. Ultrasounds of abdomen and pelvis showed signs of medical renal disease and no signs of obstruction. -creatinine normalized, IV fluids have been discontinued  R shoulder Septic arthritis -see above -Secondary to disseminated Streptococcus biology needs bacteremia work on ROM of R shoulder and gait training Follow-up with orthopedics in 7 days  DM2--controlled -metformin on hold, SSI, Hbaic is 7  Nonsustained ventricular tachycardia -had an episode of 30beats 1/15 pm -continue metoprolol, replaced K and Mag -2d ECHO with normal EF and wall motion;  no structural abnormalities -Cards following  Polyarthritis -See discussion on R shoulder septic arthritis as above, has inflammation of R elbow, wrist, L shoulder, elbow, wrist, much less pronounced than R shoulder -unable to use NSAIDs and steroids in setting of infection and acute kidney injury  Elevated Bili -Secondary to sepsis and cholestasis, ALP, AST/ALT WNL -improving  HTN -stable, holding ARB due to AKI\ -Elevation partly due to pain -Metoprolol tartrate  25 mg every 6 hours, amlodipine added  Elevated B-HCG,  -she is not pregnant, LMP>1year ago, menopausal now, Pelvic US negative for gestational sac -suspect this could be from her pituitary gland, needs to be followed, GYN As outpatient  Anemia of chronic disease,  -hemoglobin 7.9, >7.3 Status post 1 unit of packed red blood cells, will receive another unit today, continue to check FOBT Anemia panel   Family Communication:   Husband updated at beside Disposition Plan:   Home when medically stable       Procedures/Studies: US Soft Tissue  Head/neck  03/27/2015  CLINICAL DATA:  Left-sided neck pain EXAM: ULTRASOUND LEFT NECK TECHNIQUE: Longitudinal and transverse images of the left neck region in the area of pain performed COMPARISON:  None. FINDINGS: Sonographic images of the left neck show no mass or inflammatory focus. No abnormal fluid collection. In particular, no abscess. Several tiny lymph nodes are noted in this area; by size criteria, there is no adenopathy in this area. Vascular structures of this area are widely patent. IMPRESSION: Scattered subcentimeter lymph nodes in the left neck, not meeting size criteria for pathologic significance. No mass or inflammatory focus appreciable. No abscess in particular. Electronically Signed   By: Lowella Grip III M.D.   On: 03/27/2015 17:19   [place] Chest  03/31/2015  CLINICAL DATA:  50 year old female with chest x-ray demonstrating right-sided small pleural effusion. EXAM: CHEST ULTRASOUND COMPARISON:  None. FINDINGS: Ultrasound survey of the right chest demonstrating small complex pleural effusion, not amenable to ultrasound-guided drainage. IMPRESSION: Small, complex appearing right pleural effusion not amenable for ultrasound-guided thoracentesis. Signed, Dulcy Fanny. Earleen Newport, DO [facility] Radiology Electronically Signed   By: [name] D.O.   On: 03/31/2015 11:05   US Abdomen Complete  03/27/2015  CLINICAL DATA:  Septic, generalized upper and lower abdominal pain, positive pregnancy test EXAM: ABDOMEN ULTRASOUND COMPLETE COMPARISON:  None FINDINGS: Gallbladder: Dependent echogenic material with minimal shadowing likely calculi in gallbladder. No gallbladder wall thickening, pericholecystic fluid, or sonographic Murphy sign.  Common bile duct: Diameter: Normal caliber 4 mm diameter Liver: Normal appearance IVC: Normal appearance Pancreas: Distal tail obscured by bowel gas. Visualized portion normal appearance. Spleen: Normal appearance, 8.5 cm  length Right Kidney: Length: 12.5 cm. Increased cortical echogenicity. Normal cortical thickness. No mass or hydronephrosis. Left Kidney: Length: 12.7 cm. Increased cortical echogenicity. Normal cortical thickness. No mass or hydronephrosis. Abdominal aorta: Normal caliber Other findings: No free fluid IMPRESSION: Probable medical renal disease changes. Probable cholelithiasis without evidence acute cholecystitis. Remainder of exam unremarkable. Electronically Signed   By: Lavonia Dana M.D.   On: 03/27/2015 17:29   US Ob Comp Less 14 Wks  03/27/2015  CLINICAL DATA:  Low beta HCG of 27. Abdominal pain with sepsis. Elevated white blood cell count. EXAM: OBSTETRIC <14 WK [place] AND TRANSVAGINAL OB US TECHNIQUE: Both transabdominal and transvaginal ultrasound examinations were performed for complete evaluation of the gestation as well as the maternal uterus, adnexal regions, and pelvic cul-de-sac. Transvaginal technique was performed to assess early pregnancy. COMPARISON:  Ultrasound 02/13/2012 FINDINGS: Intrauterine gestational sac: None Yolk sac:  Not present Embryo:  Not present Maternal uterus/adnexae: The uterus is lobular and heterogeneous echotexture consistent multiple leiomyoma. This appears similar to the ultrasound of 2013. The endometrium is difficult to identified on the background of the multiple leiomyoma but measures 8 mm. Additionally, the ovaries are not identified. There is reported history of a LEFT oophorectomy in 2008. No free fluid. IMPRESSION: 1. Leiomyomatous uterus similar to 2013. 2. No decidual reaction or gestational sac identified. 3. No intrauterine gestational sac, yolk sac, or fetal pole identified. Differential considerations include intrauterine pregnancy too early to be sonographically visualized, missed abortion, or ectopic pregnancy. Followup ultrasound is recommended in 10-14 days for further evaluation. Electronically Signed   By: Suzy Bouchard M.D.   On: 03/27/2015 18:07   US  Ob Transvaginal  03/27/2015  CLINICAL DATA:  Low beta HCG of 27. Abdominal pain with sepsis. Elevated white blood cell count. EXAM: OBSTETRIC <14 WK [place] AND TRANSVAGINAL OB US TECHNIQUE: Both transabdominal and transvaginal ultrasound examinations were performed for complete evaluation of the gestation as well as the maternal uterus, adnexal regions, and pelvic cul-de-sac. Transvaginal technique was performed to assess early pregnancy. COMPARISON:  Ultrasound 02/13/2012 FINDINGS: Intrauterine gestational sac: None Yolk sac:  Not present Embryo:  Not present Maternal uterus/adnexae: The uterus is lobular and heterogeneous echotexture consistent multiple leiomyoma. This appears similar to the ultrasound of 2013. The endometrium is difficult to identified on the background of the multiple leiomyoma but measures 8 mm. Additionally, the ovaries are not identified. There is reported history of a LEFT oophorectomy in 2008. No free fluid. IMPRESSION: 1. Leiomyomatous uterus similar to 2013. 2. No decidual reaction or gestational sac identified. 3. No intrauterine gestational sac, yolk sac, or fetal pole identified. Differential considerations include intrauterine pregnancy too early to be sonographically visualized, missed abortion, or ectopic pregnancy. Followup ultrasound is recommended in 10-14 days for further evaluation. Electronically Signed   By: Suzy Bouchard M.D.   On: 03/27/2015 18:07   Mr Shoulder Right Wo Contrast  03/30/2015  CLINICAL DATA:  Joint pain most severe in the right shoulder. Sepsis. EXAM: MRI OF THE RIGHT SHOULDER WITHOUT CONTRAST TECHNIQUE: Multiplanar, multisequence MR imaging of the shoulder was performed. No intravenous contrast was administered. COMPARISON:  03/27/2015 FINDINGS: Rotator cuff: Prominent partial thickness articular surface tearing of the supraspinatus, with the articular surface portion retracted 1.7 cm on image 16 series 5, compatible with tendon delamination. Moderate  tendinopathy of the remaining supraspinatus tendon. Similarly there is partial thickness articular surface tearing of the infraspinatus tendon with moderate infraspinatus tendinopathy. Moderate subscapularis tendinopathy is present along with potential partial thickness articular surface tearing Muscles: Abnormal edema tracks primarily along the marginal portions of the supraspinatus, subscapularis, teres minor, and teres major muscles. There is also low-level edema along the superficial fascia margin of the lateral deltoid. Biceps long head: Partial tearing or advanced tendinopathy of the intra-articular segment. Acromioclavicular Joint: Mild degenerative AC joint arthropathy. Subacromial morphology is type 2 (curved). Small but abnormal amount of fluid in the subacromial subdeltoid bursa. Glenohumeral Joint: Prominent glenohumeral joint effusion. Moderate degenerative chondral thinning along the glenohumeral joint. Labrum: Linear irregularity in the superior labrum with a blunted appearance, compatible with SLAP tear extending into the biceps anchor. Bones: Unusual flaring of the proximal humeral metadiaphysis, query prior fracture. Small right axillary lymph nodes are observed. IMPRESSION: 1. Large shoulder joint effusion with abnormal edema tracking within and along the margins of the regional musculature. In the setting of sepsis and new onset shoulder pain, there is a high degree of concern for septic glenohumeral joint, and arthrocentesis is likely indicated. 2. Partial thickness articular surface tearing of the supraspinatus and infraspinatus with tendon delamination. There is potentially mild partial thickness articular surface tearing of the subscapularis. Considerable tendinopathy of these tendons as well. 3. Advanced tendinopathy or partial tearing of the long head of the biceps. 4. Increased signal in the superior labrum compatible with SLAP tear extending into the biceps anchor. 5. Subacromial  subdeltoid bursitis. 6. Somewhat unusual flaring of the proximal humeral metadiaphysis, query prior fracture. 7. Moderate degenerative glenohumeral arthropathy. These results will be called to the ordering clinician or representative by the Radiologist Assistant, and communication documented in the PACS or zVision Dashboard. Electronically Signed   By: Van Clines M.D.   On: 03/30/2015 07:28   Ir Fluoro Guide Cv Line Right  04/01/2015  CLINICAL DATA:  49 year old female with a history of septic arthritis. She has been referred for tunneled catheter placement for antibiotics. EXAM: IR RIGHT FLOURO GUIDE CV LINE; IR ULTRASOUND GUIDANCE VASC ACCESS RIGHT Date: 04/01/2015 ANESTHESIA/SEDATION: None Total sedation time: 0 minutes FLUOROSCOPY TIME:  30 second TECHNIQUE: The procedure, risks, benefits, and alternatives were explained to the patient. Questions regarding the procedure were encouraged and answered. The patient understands and consents to the procedure. The right neck and chest was prepped with chlorhexidine, and draped in the usual sterile fashion using maximum barrier technique (cap and mask, sterile gown, sterile gloves, large sterile sheet, hand hygiene and cutaneous antiseptic). Local anesthesia was attained by infiltration with 1% lidocaine without epinephrine. Ultrasound demonstrated patency of the right internal jugular vein, and this was documented with an image. Under real-time ultrasound guidance, this vein was accessed with a 21 gauge micropuncture needle and image documentation was performed. A small dermatotomy was made at the access site with an 11 scalpel. A 0.018" wire was advanced into the SVC and the access needle exchanged for a 22F micropuncture vascular sheath. The 0.018" wire was then removed and a 0.035" wire advanced into the IVC. Peel-away sheath was placed over the wire, and upon removal of the wire appropriate internal length was obtained/measured. Skin and subcutaneous  tissues on the right chest wall just inferior to the clavicle were infiltrated with 1% lidocaine for local anesthesia. Small stab incision was made with 11 blade scalpel. The catheter was back tunneled to the incision at the neck, with the  catheter pulled through the subcutaneous tunnel. Catheter was then ligated at the appropriate length at the neck and passed through the peel-away sheath. Peel-away sheath was removed. Final image was stored. Catheter was then sutured onto the right chest wall with retention sutures. The dermatotomy at the venous access site was also closed with Dermabond. Patient tolerated the procedure well and remained hemodynamically stable throughout. No complications were encountered and no significant blood loss was encountered. COMPLICATIONS: None IMPRESSION: Status post placement of right IJ tunneled catheter measuring 24 centimeter. Catheter ready for use. Signed, Dulcy Fanny. Earleen Newport, DO [facility] Radiology Electronically Signed   By: [name] D.O.   On: 04/01/2015 14:45   Ir US Guide Vasc Access Right  04/01/2015  CLINICAL DATA:  50 year old female with a history of septic arthritis. She has been referred for tunneled catheter placement for antibiotics. EXAM: IR RIGHT FLOURO GUIDE CV LINE; IR ULTRASOUND GUIDANCE VASC ACCESS RIGHT Date: 04/01/2015 ANESTHESIA/SEDATION: None Total sedation time: 0 minutes FLUOROSCOPY TIME:  30 second TECHNIQUE: The procedure, risks, benefits, and alternatives were explained to the patient. Questions regarding the procedure were encouraged and answered. The patient understands and consents to the procedure. The right neck and chest was prepped with chlorhexidine, and draped in the usual sterile fashion using maximum barrier technique (cap and mask, sterile gown, sterile gloves, large sterile sheet, hand hygiene and cutaneous antiseptic). Local anesthesia was attained by infiltration with 1% lidocaine  without epinephrine. Ultrasound demonstrated patency of the right internal jugular vein, and this was documented with an image. Under real-time ultrasound guidance, this vein was accessed with a 21 gauge micropuncture needle and image documentation was performed. A small dermatotomy was made at the access site with an 11 scalpel. A 0.018" wire was advanced into the SVC and the access needle exchanged for a 57F micropuncture vascular sheath. The 0.018" wire was then removed and a 0.035" wire advanced into the IVC. Peel-away sheath was placed over the wire, and upon removal of the wire appropriate internal length was obtained/measured. Skin and subcutaneous tissues on the right chest wall just inferior to the clavicle were infiltrated with 1% lidocaine for local anesthesia. Small stab incision was made with 11 blade scalpel. The catheter was back tunneled to the incision at the neck, with the catheter pulled through the subcutaneous tunnel. Catheter was then ligated at the appropriate length at the neck and passed through the peel-away sheath. Peel-away sheath was removed. Final image was stored. Catheter was then sutured onto the right chest wall with retention sutures. The dermatotomy at the venous access site was also closed with Dermabond. Patient tolerated the procedure well and remained hemodynamically stable throughout. No complications were encountered and no significant blood loss was encountered. COMPLICATIONS: None IMPRESSION: Status post placement of right IJ tunneled catheter measuring 24 centimeter. Catheter ready for use. Signed, Dulcy Fanny. Earleen Newport, DO [facility] Radiology Electronically Signed   By: [name] D.O.   On: 04/01/2015 14:45   Dg Chest Port 1 View  03/31/2015  CLINICAL DATA:  Acute onset of shortness of breath. Initial encounter. EXAM: PORTABLE CHEST 1 VIEW COMPARISON:  Chest radiograph from 03/27/2015 FINDINGS: A small to moderate  right-sided pleural effusion is noted. Right basilar airspace opacity may reflect atelectasis or pneumonia. Asymmetric interstitial edema could have a similar appearance. Underlying vascular congestion is seen. No pneumothorax is identified. The cardiomediastinal silhouette is borderline enlarged. No acute osseous abnormalities are seen. IMPRESSION: 1.  Small to moderate right-sided pleural effusion noted. Right basilar airspace opacity may reflect atelectasis or pneumonia. Asymmetric interstitial edema could have a similar appearance. 2. Underlying vascular congestion and borderline cardiomegaly. Electronically Signed   By: Garald Balding M.D.   On: 03/31/2015 01:49   Dg Chest Port 1 View  03/27/2015  CLINICAL DATA:  Fever for 3 days EXAM: PORTABLE CHEST 1 VIEW COMPARISON:  None. FINDINGS: Lungs are clear. Heart is upper normal in size with pulmonary vascularity within normal limits. No adenopathy. No bone lesions. IMPRESSION: No edema or consolidation. Electronically Signed   By: Lowella Grip III M.D.   On: 03/27/2015 15:43   Dg Fluoro Guided Needle Plc Aspiration/injection Loc  03/30/2015  CLINICAL DATA:  Prior MRI suspicious for septic glenohumeral joint. Sepsis. EXAM: FLUORO GUIDED NEEDLE PLACEMENT FLUOROSCOPY TIME:  If the device does not provide the exposure index: Fluoroscopy Time (in minutes and seconds):  1 minutes and 42 seconds Number of Acquired Images:  None COMPARISON:  MRI of 03/29/2015. FINDINGS: Informed written and verbal consent were obtained. Risks and benefits of the procedure were discussed. A "Time out" was performed. The superior medial portion of the right humeral head was localized under fluoroscopic guidance. Skin was prepped and draped in standard sterile fashion. Skin was numbed with 1% lidocaine. A 20 gauge needle was inserted into the joint. No fluid could be aspirated. Intra-articular location was confirmed with approximately 6 cc of Omnipaque 180. Subsequent, approximately  5 cc of saline were injected. Only minimal fluid could be returned. IMPRESSION: Uncomplicated aspiration of the right shoulder, as detailed above. Minimal fluid was obtained, and sent to laboratory as requested. Electronically Signed   By: Abigail Miyamoto M.D.   On: 03/30/2015 12:11        Subjective: Patient continues to complain of pain in the right shoulder, right elbow as well as intermittent pain in the left upper extremity. No BM for several days  Objective: Filed Vitals:   04/03/15 0011 04/03/15 0400 04/03/15 0439 04/03/15 0849  BP:  146/85  156/98  Pulse:    107  Temp: 99 F (37.2 C)  98.8 F (37.1 C) 99.6 F (37.6 C)  TempSrc: Oral  Oral Oral  Resp:    24  Height:      Weight:      SpO2:  98%  99%    Intake/Output Summary (Last 24 hours) at 04/03/15 0854 Last data filed at 04/03/15 0600  Gross per 24 hour  Intake   2325 ml  Output    400 ml  Net   1925 ml   Weight change:  Exam:   General:  Pt is alert, follows commands appropriately, not in acute distress  HEENT: No icterus, No thrush, No neck mass, [place]/AT  Cardiovascular: RRR, S1/S2, no rubs, no gallops  Respiratory: Bibasilar crackles, L>R. No wheeze.  Abdomen: Soft/+BS, non tender, non distended, no guarding  Extremities: No edema, No lymphangitis, No petechiae, No rashes, no synovitis  Data Reviewed: Basic Metabolic Panel:  Recent Labs Lab 03/28/15 0015 03/28/15 0944 03/28/15 1125 03/29/15 0624 03/30/15 0715 03/31/15 0105 04/01/15 0237 04/02/15 0345  NA 137 138 154* 134* 131* 133* 131* 130*  K 3.7 3.9 4.3 3.4* 3.8 3.8 3.7 3.6  CL 104 105 114* 106 100* 102 101 98*  CO2 20* 20* 19* 20* 21* 21* 20* 25  GLUCOSE 141* 135* 139* 198* 179* 196* 214* 151*  BUN 41* 31* 28* 19 15 16 18 17   CREATININE 2.52*  1.72* 1.77* 1.08* 0.84 0.95 1.09* 1.11*  CALCIUM 7.1* 7.7* 7.7* 8.2* 8.6* 8.5* 7.9* 8.0*  MG  --   --  1.7  --   --   --   --   --   PHOS 3.6 2.9  --   --   --   --   --   --    Liver Function  Tests:  Recent Labs Lab 03/28/15 1125 03/29/15 0624 03/30/15 0715 03/31/15 0105 04/01/15 0237  AST 38 32 52* 36 25  ALT 27 27 24 22  12*  ALKPHOS 107 137* 161* 114 103  BILITOT 3.1* 4.0* 2.1* 1.2 1.3*  PROT 6.3* 6.5 6.2* 6.0* 5.9*  ALBUMIN 2.3* 2.2* 1.8* 1.7* 1.6*    Recent Labs Lab 03/27/15 1152  LIPASE 22    Recent Labs Lab 03/31/15 0105  AMMONIA 49*   CBC:  Recent Labs Lab 03/27/15 1152  03/30/15 0715 03/31/15 0105 04/01/15 0237 04/02/15 0345 04/03/15 0450  WBC 21.9*  < > 22.5* 21.2* 19.3* 16.0* 15.0*  NEUTROABS 21.1*  --   --   --   --  12.8*  --   HGB 11.4*  < > 8.2* 7.9* 7.9* 7.3* 8.9*  HCT 33.6*  < > 24.4* 23.6* 23.2* 22.0* 25.4*  MCV 82.0  < > 81.3 81.1 80.6 82.4 80.9  PLT 176  < > 193 177 169 174 169  < > = values in this interval not displayed. Cardiac Enzymes:  Recent Labs Lab 03/28/15 0943  CKTOTAL 179   BNP: Invalid input(s): POCBNP CBG:  Recent Labs Lab 04/01/15 2117 04/02/15 0811 04/02/15 1236 04/02/15 1741 04/02/15 2058  GLUCAP 128* 157* 164* 208* 201*    Recent Results (from the past 240 hour(s))  Urine culture     Status: None   Collection Time: 03/27/15 12:42 PM  Result Value Ref Range Status   Specimen Description URINE, CLEAN CATCH  Final   Special Requests Normal  Final   Culture   Final    >=100,000 COLONIES/mL GROUP A STREP (S.PYOGENES) ISOLATED   Report Status 03/29/2015 FINAL  Final  Culture, blood (Routine X 2) w Reflex to ID Panel     Status: None   Collection Time: 03/27/15  2:25 PM  Result Value Ref Range Status   Specimen Description BLOOD LEFT ANTECUBITAL  Final   Special Requests BOTTLES DRAWN AEROBIC ONLY 5CC  Final   Culture  Setup Time   Final    GRAM POSITIVE COCCI IN PAIRS IN CHAINS AEROBIC BOTTLE ONLY CRITICAL RESULT CALLED TO, READ BACK BY AND VERIFIED WITH: A. MINTZ,RN AT 0738 ON [id number] BY Rhea Bleacher    Culture   Final    GROUP A STREP (S.PYOGENES) ISOLATED SUSCEPTIBILITIES PERFORMED ON  PREVIOUS CULTURE WITHIN THE LAST 5 DAYS.    Report Status 03/30/2015 FINAL  Final  Culture, blood (Routine X 2) w Reflex to ID Panel     Status: None   Collection Time: 03/27/15  3:00 PM  Result Value Ref Range Status   Specimen Description BLOOD RIGHT HAND  Final   Special Requests BOTTLES DRAWN AEROBIC AND ANAEROBIC 5CC  Final   Culture  Setup Time   Final    GRAM POSITIVE COCCI IN PAIRS IN CHAINS IN BOTH AEROBIC AND ANAEROBIC BOTTLES CRITICAL RESULT CALLED TO, READ BACK BY AND VERIFIED WITH: A. MINTZ,RN AT 0848 ON [id number] BY Rhea Bleacher    Culture GROUP A STREP (S.PYOGENES) ISOLATED  Final   Report Status  03/30/2015 FINAL  Final   Organism ID, Bacteria GROUP A STREP (S.PYOGENES) ISOLATED  Final      Susceptibility   Group a strep (s.pyogenes) isolated - MIC*    CLINDAMYCIN >=1 RESISTANT Resistant     AMPICILLIN <=0.25 SENSITIVE Sensitive     ERYTHROMYCIN >=8 RESISTANT Resistant     VANCOMYCIN <=0.12 SENSITIVE Sensitive     CEFTRIAXONE <=0.12 SENSITIVE Sensitive     LEVOFLOXACIN <=0.25 SENSITIVE Sensitive     * GROUP A STREP (S.PYOGENES) ISOLATED  Culture, blood (routine x 2)     Status: None   Collection Time: 03/28/15  3:00 PM  Result Value Ref Range Status   Specimen Description BLOOD RIGHT HAND  Final   Special Requests BOTTLES DRAWN AEROBIC AND ANAEROBIC 5CC  Final   Culture NO GROWTH 5 DAYS  Final   Report Status 04/02/2015 FINAL  Final  Culture, blood (routine x 2)     Status: None   Collection Time: 03/28/15  3:05 PM  Result Value Ref Range Status   Specimen Description BLOOD RIGHT HAND  Final   Special Requests BOTTLES DRAWN AEROBIC ONLY 8.5CC  Final   Culture NO GROWTH 5 DAYS  Final   Report Status 04/02/2015 FINAL  Final  Gram stain     Status: None   Collection Time: 03/30/15 10:52 AM  Result Value Ref Range Status   Specimen Description FLUID SYNOVIAL RIGHT SHOULDER  Final   Special Requests NONE  Final   Gram Stain   Final    MODERATE WBC PRESENT,  PREDOMINANTLY PMN FEW GRAM POSITIVE COCCI IN PAIRS IN CHAINS    Report Status 03/30/2015 FINAL  Final  Fungus Culture with Smear     Status: None (Preliminary result)   Collection Time: 03/30/15 10:52 AM  Result Value Ref Range Status   Specimen Description FLUID SYNOVIAL RIGHT SHOULDER  Final   Special Requests NONE  Final   Fungal Smear   Final    NO YEAST OR FUNGAL ELEMENTS SEEN Performed at [facility]    Culture   Final    CULTURE IN PROGRESS FOR FOUR WEEKS Performed at [facility]    Report Status PENDING  Incomplete  Anaerobic culture     Status: None (Preliminary result)   Collection Time: 03/30/15  7:42 PM  Result Value Ref Range Status   Specimen Description SYNOVIAL RIGHT SHOULDER  Final   Special Requests PATIENT ON FOLLOWING PENICILLIN  Final   Gram Stain   Final    ABUNDANT WBC PRESENT, PREDOMINANTLY PMN NO ORGANISMS SEEN    Culture   Final    NO ANAEROBES ISOLATED; CULTURE IN PROGRESS FOR 5 DAYS   Report Status PENDING  Incomplete  Body fluid culture     Status: None   Collection Time: 03/30/15  7:43 PM  Result Value Ref Range Status   Specimen Description SYNOVIAL RIGHT SHOULDER  Final   Special Requests PATIENT ON FOLLOWING PENICILLIN  Final   Gram Stain   Final    ABUNDANT WBC PRESENT, PREDOMINANTLY PMN NO ORGANISMS SEEN    Culture NO GROWTH 3 DAYS  Final   Report Status 04/03/2015 FINAL  Final  MRSA PCR Screening     Status: None   Collection Time: 03/31/15  1:55 AM  Result Value Ref Range Status   MRSA by PCR NEGATIVE NEGATIVE Final    Comment:        The GeneXpert MRSA Assay (FDA approved for NASAL specimens only),  is one component of a comprehensive MRSA colonization surveillance program. It is not intended to diagnose MRSA infection nor to guide or monitor treatment for MRSA infections.      Scheduled Meds: . sodium chloride   Intravenous Once  . amLODipine  5 mg Oral BID  . docusate sodium  100 mg Oral BID  .  ferrous sulfate  325 mg Oral BID WC  . insulin aspart  0-9 Units Subcutaneous TID WC  . insulin aspart  3 Units Subcutaneous TID WC  . metoprolol tartrate  50 mg Oral BID  . midazolam  2 mg Intravenous Once  . pencillin G potassium IV  4 Million Units Intravenous Q4H  . polyethylene glycol  17 g Oral Daily  . senna  2 tablet Oral Daily  . sodium chloride  10-40 mL Intracatheter Q12H   Continuous Infusions: . sodium chloride 10 mL/hr at 04/01/15 2125  . lactated ringers 10 mL/hr at 03/30/15 1543     Reyne Dumas, M.D. [facility] Pager [phone]  If 7PM-7AM, please contact night-coverage [email or web address] Password [email or web address] 04/03/2015, 8:54 AM   LOS: 7 days

## 2015-04-03 NOTE — Progress Notes (Signed)
Patient trasfered from [facility] to [phone] via bed; alert and oriented x 4; complainting of pain in right arm and right shoulder (8/10); Right subclav. double lumen capped; skin intact (checked by RN floor and charge nurse). Orient patient to room and unit; instructed how to use the call bell and  fall risk precautions. Will continue to monitor the patient.

## 2015-04-04 LAB — COMPREHENSIVE METABOLIC PANEL
ALT: 12 U/L — ABNORMAL LOW (ref 14–54)
AST: 21 U/L (ref 15–41)
Albumin: 1.5 g/dL — ABNORMAL LOW (ref 3.5–5.0)
Alkaline Phosphatase: 74 U/L (ref 38–126)
Anion gap: 5 (ref 5–15)
BILIRUBIN TOTAL: 0.8 mg/dL (ref 0.3–1.2)
BUN: 23 mg/dL — AB (ref 6–20)
CO2: 23 mmol/L (ref 22–32)
Calcium: 7.8 mg/dL — ABNORMAL LOW (ref 8.9–10.3)
Chloride: 100 mmol/L — ABNORMAL LOW (ref 101–111)
Creatinine, Ser: 1.33 mg/dL — ABNORMAL HIGH (ref 0.44–1.00)
GFR, EST AFRICAN AMERICAN: 53 mL/min — AB (ref >60)
GFR, EST NON AFRICAN AMERICAN: 46 mL/min — AB (ref >60)
Glucose, Bld: 231 mg/dL — ABNORMAL HIGH (ref 65–99)
POTASSIUM: 3.9 mmol/L (ref 3.5–5.1)
Sodium: 128 mmol/L — ABNORMAL LOW (ref 135–145)
TOTAL PROTEIN: 6.3 g/dL — AB (ref 6.5–8.1)

## 2015-04-04 LAB — CBC
HEMATOCRIT: 24.1 % — AB (ref 36.0–46.0)
Hemoglobin: 8.5 g/dL — ABNORMAL LOW (ref 12.0–15.0)
MCH: 28.7 pg (ref 26.0–34.0)
MCHC: 35.3 g/dL (ref 30.0–36.0)
MCV: 81.4 fL (ref 78.0–100.0)
Platelets: 196 10*3/uL (ref 150–400)
RBC: 2.96 MIL/uL — ABNORMAL LOW (ref 3.87–5.11)
RDW: 14.9 % (ref 11.5–15.5)
WBC: 12.1 10*3/uL — ABNORMAL HIGH (ref 4.0–10.5)

## 2015-04-04 LAB — GLUCOSE, CAPILLARY
GLUCOSE-CAPILLARY: 272 mg/dL — AB (ref 65–99)
GLUCOSE-CAPILLARY: 199 mg/dL — AB (ref 65–99)
Glucose-Capillary: 138 mg/dL — ABNORMAL HIGH (ref 65–99)

## 2015-04-04 MED ORDER — OXYCODONE HCL 5 MG PO TABS
5.0000 mg | ORAL_TABLET | ORAL | Status: DC | PRN
  Administered 2015-04-04 – 2015-04-06 (×6): 5 mg via ORAL
  Filled 2015-04-04 (×7): qty 1

## 2015-04-04 MED ORDER — GLUCERNA SHAKE PO LIQD
237.0000 mL | Freq: Three times a day (TID) | ORAL | Status: DC
  Administered 2015-04-04 – 2015-04-07 (×10): 237 mL via ORAL

## 2015-04-04 NOTE — Progress Notes (Addendum)
PROGRESS NOTE  [name] [id number] DOB: 1965/08/04 DOA: 03/27/2015 PCP: [name], MD   [name] is a 50 y.o. female with hx of DM (no meds) , HTN and uterine fibroids who had sudden onset of rigors 2 days ago followed by epigastric pain, nausea, vomiting and mild diarrhea. She then began to have severe left-sided posterior neck pain leading her to come to the hospital yesterday. She had low-grade fever of 100.5 and acute renal insufficiency. Her chest x-ray was negative. Abdominal ultrasound was normal with the exception of questionable cholelithiasis. Her urinalysis did show some pyuria but she has not been having any urinary tract symptoms. She was started on empiric daptomycin and piperacillin tazobactam (vancomycin was avoided because of concerns about potential nephrotoxicity). Both admission blood cultures are already growing gram-positive cocci in pairs and chains. Her abdominal pain, nausea, vomiting and diarrhea have completely resolved. She continues to have left-sided neck pain but it is slightly better. Over the past 24 hours she has developed joint pains. It is most severe in her right shoulder, right elbow and right wrist but she also has some milder pain in her left shoulder and right knee.   Assessment/Plan: Sepsis -present at the time of admission Disseminated GAS bacteremia - on penicillin and WBC improved, seems to be improving overall.  Treat with penicillin (if available in a pump) for 4 weeks total from surgery 1/17 through February 13th.  Weekly cmp, cbc to [facility] Infectious disease will arrange follow up in [facility] in about 2 weeks Discussed on multiple occasions with Dr. Johnnye Sima about continued low-grade fevers, he does not recommend any changes in treatment or any further testing at this time.  GAS Bacteremia with Septic Arthritis of R shoulder -Was on IV Daptomycin, now IV Penicillin since 1/16 per ID, patient needs a total of 4 weeks  of IV penicillin as above -had some sore throat prior to admission and son had strep throat 2weeks ago. -ID and Cardiology were consulted for bacteremia Bacteremia Identified as S Pyogenes, cardiology was consulted for possible TEE, however this was canceled per ID recommendations , Can reschedule if blood cultures remain positive , blood cultures from 1/17  no growth so far -per ID unlikely to have endocarditis, hence patient did not have TEE,  Due to Severe pain and swelling of R shoudler got MRi  and patient found to have septic arthritis  -Arthrocentesis by IR--gram stain GPC in pairs and chains in R shoulder synovial fluid, c/w seeding and septic arthritis from bacteremia (culture neg) -Ortho consulted, d/w Dr.Tim Percell Miller, patient is status post arthroscopy, irrigation and debridement of right shoulder on 1/17 -04/01/2015--infectious disease ordered PICC line--husband requested IR to place a tunnel catheter in the IJ due to his concerns of vein sclerosis presumptive need of dialysis although the patient has normal renal function at this time  Patient now has right IJ tunneled cuffed hickman placement interventional radiology [name], DO recommended  not submerge right IJ for 7 days   Anemia of chronic disease Patient has received a total of 2 units of packed red blood cells during this admission Follow CBC Anemia panel shows iron deficiency and the patient has been started on iron supplementation Patient is to follow-up at [facility] gastroenterology for endoscopic evaluation once her bacteremia resolves   Right lower lobe infiltrate/HCAP? -Patient is small to moderate-sized pleural effusion, possible right basilar air space opacity, atelectasis versus pneumonia,   white count improving  21.2 > 15 Patient currently on  penicillin G Based on the chest x-ray Dr.[name] , has not recommended any changes in her antibiotic treatment PCCM also consulted during this admission, no evidence of  pleural effusion on ultrasound. According to the recommendation Continue abx for bacteremia as per ID. No need for any additional coverage Family requested transfer to [place], discussed with hospitalist (Dr Beckey Downing) there who has not accepted patient as they are on diversion due to unavailability of beds , husband aware that patient has not been accepted due to no medical indication for transfer  ABG 7.47, 29.7, 102   AKI  -Resolved 5.26>1.33 -due to sepsis, poor Po intake, NSAIDs, ARB -Baseline creatinine thought to be within normal limits,   -Nephrology consulted. Ultrasounds of abdomen and pelvis showed signs of medico renal disease and no signs of obstruction. -creatinine normalized, IV fluids have been discontinued, hold lasix as she appears euvolemic   R shoulder Septic arthritis -see above -Secondary to disseminated Streptococcus biology needs bacteremia work on ROM of R shoulder and gait training Follow-up with orthopedics in 7 days  DM2--controlled -metformin on hold, SSI, Hbaic is 7  Nonsustained ventricular tachycardia -had an episode of 30 beats 1/15 pm -continue metoprolol, replaced K and Mag -2d ECHO with normal EF and wall motion;  no structural abnormalities -Cards signed off 1/20  Polyarthritis -See discussion on R shoulder septic arthritis as above, has inflammation of R elbow, wrist, L shoulder, elbow, wrist, much less pronounced than R shoulder -unable to use NSAIDs and steroids in setting of infection and acute kidney injury  Elevated Bili -Secondary to sepsis and cholestasis, ALP, AST/ALT WNL -improving  HTN -stable, holding ARB due to AKI\ -Elevation partly due to pain -Metoprolol tartrate  25 mg every 6 hours, amlodipine added  Elevated B-HCG,  -she is not pregnant, LMP>1year ago, menopausal now, Pelvic US negative for gestational sac -suspect this could be from her pituitary gland, needs to be followed, GYN As outpatient  Anemia of chronic  disease,  -hemoglobin 7.9, >7.3, FOBT positive  Received a total of 2 units of packed red blood cells Anemia panel consistent with anemia of chronic disease, relative iron deficiency Patient's iron supplementation Hemoglobin improved to 8.9 after 2 units, today 8.5, no active bleeding Patient would need follow-up at [facility] gastroenterology  In the outpt setting    Family Communication:   Husband updated at beside Disposition Plan:   Home in am        Procedures/Studies: US Soft Tissue Head/neck  03/27/2015  CLINICAL DATA:  Left-sided neck pain EXAM: ULTRASOUND LEFT NECK TECHNIQUE: Longitudinal and transverse images of the left neck region in the area of pain performed COMPARISON:  None. FINDINGS: Sonographic images of the left neck show no mass or inflammatory focus. No abnormal fluid collection. In particular, no abscess. Several tiny lymph nodes are noted in this area; by size criteria, there is no adenopathy in this area. Vascular structures of this area are widely patent. IMPRESSION: Scattered subcentimeter lymph nodes in the left neck, not meeting size criteria for pathologic significance. No mass or inflammatory focus appreciable. No abscess in particular. Electronically Signed   By: Lowella Grip III M.D.   On: 03/27/2015 17:19   [place] Chest  03/31/2015  CLINICAL DATA:  50 year old female with chest x-ray demonstrating right-sided small pleural effusion. EXAM: CHEST ULTRASOUND COMPARISON:  None. FINDINGS: Ultrasound survey of the right chest demonstrating small complex pleural effusion, not amenable to ultrasound-guided drainage. IMPRESSION: Small, complex  appearing right pleural effusion not amenable for ultrasound-guided thoracentesis. Signed, Dulcy Fanny. Earleen Newport, DO [facility] Radiology Electronically Signed   By: [name] D.O.   On: 03/31/2015 11:05   US Abdomen Complete  03/27/2015  CLINICAL DATA:  Septic, generalized upper and  lower abdominal pain, positive pregnancy test EXAM: ABDOMEN ULTRASOUND COMPLETE COMPARISON:  None FINDINGS: Gallbladder: Dependent echogenic material with minimal shadowing likely calculi in gallbladder. No gallbladder wall thickening, pericholecystic fluid, or sonographic Murphy sign. Common bile duct: Diameter: Normal caliber 4 mm diameter Liver: Normal appearance IVC: Normal appearance Pancreas: Distal tail obscured by bowel gas. Visualized portion normal appearance. Spleen: Normal appearance, 8.5 cm length Right Kidney: Length: 12.5 cm. Increased cortical echogenicity. Normal cortical thickness. No mass or hydronephrosis. Left Kidney: Length: 12.7 cm. Increased cortical echogenicity. Normal cortical thickness. No mass or hydronephrosis. Abdominal aorta: Normal caliber Other findings: No free fluid IMPRESSION: Probable medical renal disease changes. Probable cholelithiasis without evidence acute cholecystitis. Remainder of exam unremarkable. Electronically Signed   By: Lavonia Dana M.D.   On: 03/27/2015 17:29   US Ob Comp Less 14 Wks  03/27/2015  CLINICAL DATA:  Low beta HCG of 27. Abdominal pain with sepsis. Elevated white blood cell count. EXAM: OBSTETRIC <14 WK [place] AND TRANSVAGINAL OB US TECHNIQUE: Both transabdominal and transvaginal ultrasound examinations were performed for complete evaluation of the gestation as well as the maternal uterus, adnexal regions, and pelvic cul-de-sac. Transvaginal technique was performed to assess early pregnancy. COMPARISON:  Ultrasound 02/13/2012 FINDINGS: Intrauterine gestational sac: None Yolk sac:  Not present Embryo:  Not present Maternal uterus/adnexae: The uterus is lobular and heterogeneous echotexture consistent multiple leiomyoma. This appears similar to the ultrasound of 2013. The endometrium is difficult to identified on the background of the multiple leiomyoma but measures 8 mm. Additionally, the ovaries are not identified. There is reported history of a LEFT  oophorectomy in 2008. No free fluid. IMPRESSION: 1. Leiomyomatous uterus similar to 2013. 2. No decidual reaction or gestational sac identified. 3. No intrauterine gestational sac, yolk sac, or fetal pole identified. Differential considerations include intrauterine pregnancy too early to be sonographically visualized, missed abortion, or ectopic pregnancy. Followup ultrasound is recommended in 10-14 days for further evaluation. Electronically Signed   By: Suzy Bouchard M.D.   On: 03/27/2015 18:07   US Ob Transvaginal  03/27/2015  CLINICAL DATA:  Low beta HCG of 27. Abdominal pain with sepsis. Elevated white blood cell count. EXAM: OBSTETRIC <14 WK [place] AND TRANSVAGINAL OB US TECHNIQUE: Both transabdominal and transvaginal ultrasound examinations were performed for complete evaluation of the gestation as well as the maternal uterus, adnexal regions, and pelvic cul-de-sac. Transvaginal technique was performed to assess early pregnancy. COMPARISON:  Ultrasound 02/13/2012 FINDINGS: Intrauterine gestational sac: None Yolk sac:  Not present Embryo:  Not present Maternal uterus/adnexae: The uterus is lobular and heterogeneous echotexture consistent multiple leiomyoma. This appears similar to the ultrasound of 2013. The endometrium is difficult to identified on the background of the multiple leiomyoma but measures 8 mm. Additionally, the ovaries are not identified. There is reported history of a LEFT oophorectomy in 2008. No free fluid. IMPRESSION: 1. Leiomyomatous uterus similar to 2013. 2. No decidual reaction or gestational sac identified. 3. No intrauterine gestational sac, yolk sac, or fetal pole identified. Differential considerations include intrauterine pregnancy too early to be sonographically visualized, missed abortion, or ectopic pregnancy. Followup ultrasound is recommended in 10-14 days for further evaluation. Electronically Signed   By: Nicole Kindred  Leonia Reeves M.D.   On: 03/27/2015 18:07   Mr Shoulder Right Wo  Contrast  03/30/2015  CLINICAL DATA:  Joint pain most severe in the right shoulder. Sepsis. EXAM: MRI OF THE RIGHT SHOULDER WITHOUT CONTRAST TECHNIQUE: Multiplanar, multisequence MR imaging of the shoulder was performed. No intravenous contrast was administered. COMPARISON:  03/27/2015 FINDINGS: Rotator cuff: Prominent partial thickness articular surface tearing of the supraspinatus, with the articular surface portion retracted 1.7 cm on image 16 series 5, compatible with tendon delamination. Moderate tendinopathy of the remaining supraspinatus tendon. Similarly there is partial thickness articular surface tearing of the infraspinatus tendon with moderate infraspinatus tendinopathy. Moderate subscapularis tendinopathy is present along with potential partial thickness articular surface tearing Muscles: Abnormal edema tracks primarily along the marginal portions of the supraspinatus, subscapularis, teres minor, and teres major muscles. There is also low-level edema along the superficial fascia margin of the lateral deltoid. Biceps long head: Partial tearing or advanced tendinopathy of the intra-articular segment. Acromioclavicular Joint: Mild degenerative AC joint arthropathy. Subacromial morphology is type 2 (curved). Small but abnormal amount of fluid in the subacromial subdeltoid bursa. Glenohumeral Joint: Prominent glenohumeral joint effusion. Moderate degenerative chondral thinning along the glenohumeral joint. Labrum: Linear irregularity in the superior labrum with a blunted appearance, compatible with SLAP tear extending into the biceps anchor. Bones: Unusual flaring of the proximal humeral metadiaphysis, query prior fracture. Small right axillary lymph nodes are observed. IMPRESSION: 1. Large shoulder joint effusion with abnormal edema tracking within and along the margins of the regional musculature. In the setting of sepsis and new onset shoulder pain, there is a high degree of concern for septic  glenohumeral joint, and arthrocentesis is likely indicated. 2. Partial thickness articular surface tearing of the supraspinatus and infraspinatus with tendon delamination. There is potentially mild partial thickness articular surface tearing of the subscapularis. Considerable tendinopathy of these tendons as well. 3. Advanced tendinopathy or partial tearing of the long head of the biceps. 4. Increased signal in the superior labrum compatible with SLAP tear extending into the biceps anchor. 5. Subacromial subdeltoid bursitis. 6. Somewhat unusual flaring of the proximal humeral metadiaphysis, query prior fracture. 7. Moderate degenerative glenohumeral arthropathy. These results will be called to the ordering clinician or representative by the Radiologist Assistant, and communication documented in the PACS or zVision Dashboard. Electronically Signed   By: Van Clines M.D.   On: 03/30/2015 07:28   Ir Fluoro Guide Cv Line Right  04/01/2015  CLINICAL DATA:  50 year old female with a history of septic arthritis. She has been referred for tunneled catheter placement for antibiotics. EXAM: IR RIGHT FLOURO GUIDE CV LINE; IR ULTRASOUND GUIDANCE VASC ACCESS RIGHT Date: 04/01/2015 ANESTHESIA/SEDATION: None Total sedation time: 0 minutes FLUOROSCOPY TIME:  30 second TECHNIQUE: The procedure, risks, benefits, and alternatives were explained to the patient. Questions regarding the procedure were encouraged and answered. The patient understands and consents to the procedure. The right neck and chest was prepped with chlorhexidine, and draped in the usual sterile fashion using maximum barrier technique (cap and mask, sterile gown, sterile gloves, large sterile sheet, hand hygiene and cutaneous antiseptic). Local anesthesia was attained by infiltration with 1% lidocaine without epinephrine. Ultrasound demonstrated patency of the right internal jugular vein, and this was documented with an image. Under real-time ultrasound  guidance, this vein was accessed with a 21 gauge micropuncture needle and image documentation was performed. A small dermatotomy was made at the access site with an 11 scalpel. A 0.018" wire was advanced into the SVC and  the access needle exchanged for a 82F micropuncture vascular sheath. The 0.018" wire was then removed and a 0.035" wire advanced into the IVC. Peel-away sheath was placed over the wire, and upon removal of the wire appropriate internal length was obtained/measured. Skin and subcutaneous tissues on the right chest wall just inferior to the clavicle were infiltrated with 1% lidocaine for local anesthesia. Small stab incision was made with 11 blade scalpel. The catheter was back tunneled to the incision at the neck, with the catheter pulled through the subcutaneous tunnel. Catheter was then ligated at the appropriate length at the neck and passed through the peel-away sheath. Peel-away sheath was removed. Final image was stored. Catheter was then sutured onto the right chest wall with retention sutures. The dermatotomy at the venous access site was also closed with Dermabond. Patient tolerated the procedure well and remained hemodynamically stable throughout. No complications were encountered and no significant blood loss was encountered. COMPLICATIONS: None IMPRESSION: Status post placement of right IJ tunneled catheter measuring 24 centimeter. Catheter ready for use. Signed, Dulcy Fanny. Earleen Newport, DO [facility] Radiology Electronically Signed   By: [name] D.O.   On: 04/01/2015 14:45   Ir US Guide Vasc Access Right  04/01/2015  CLINICAL DATA:  50 year old female with a history of septic arthritis. She has been referred for tunneled catheter placement for antibiotics. EXAM: IR RIGHT FLOURO GUIDE CV LINE; IR ULTRASOUND GUIDANCE VASC ACCESS RIGHT Date: 04/01/2015 ANESTHESIA/SEDATION: None Total sedation time: 0 minutes FLUOROSCOPY TIME:  30 second  TECHNIQUE: The procedure, risks, benefits, and alternatives were explained to the patient. Questions regarding the procedure were encouraged and answered. The patient understands and consents to the procedure. The right neck and chest was prepped with chlorhexidine, and draped in the usual sterile fashion using maximum barrier technique (cap and mask, sterile gown, sterile gloves, large sterile sheet, hand hygiene and cutaneous antiseptic). Local anesthesia was attained by infiltration with 1% lidocaine without epinephrine. Ultrasound demonstrated patency of the right internal jugular vein, and this was documented with an image. Under real-time ultrasound guidance, this vein was accessed with a 21 gauge micropuncture needle and image documentation was performed. A small dermatotomy was made at the access site with an 11 scalpel. A 0.018" wire was advanced into the SVC and the access needle exchanged for a 82F micropuncture vascular sheath. The 0.018" wire was then removed and a 0.035" wire advanced into the IVC. Peel-away sheath was placed over the wire, and upon removal of the wire appropriate internal length was obtained/measured. Skin and subcutaneous tissues on the right chest wall just inferior to the clavicle were infiltrated with 1% lidocaine for local anesthesia. Small stab incision was made with 11 blade scalpel. The catheter was back tunneled to the incision at the neck, with the catheter pulled through the subcutaneous tunnel. Catheter was then ligated at the appropriate length at the neck and passed through the peel-away sheath. Peel-away sheath was removed. Final image was stored. Catheter was then sutured onto the right chest wall with retention sutures. The dermatotomy at the venous access site was also closed with Dermabond. Patient tolerated the procedure well and remained hemodynamically stable throughout. No complications were encountered and no significant blood loss was encountered.  COMPLICATIONS: None IMPRESSION: Status post placement of right IJ tunneled catheter measuring 24 centimeter. Catheter ready for use. Signed, Dulcy Fanny. Earleen Newport, DO [facility] Radiology Electronically Signed   By: [name] D.O.  On: 04/01/2015 14:45   Dg Chest Port 1 View  03/31/2015  CLINICAL DATA:  Acute onset of shortness of breath. Initial encounter. EXAM: PORTABLE CHEST 1 VIEW COMPARISON:  Chest radiograph from 03/27/2015 FINDINGS: A small to moderate right-sided pleural effusion is noted. Right basilar airspace opacity may reflect atelectasis or pneumonia. Asymmetric interstitial edema could have a similar appearance. Underlying vascular congestion is seen. No pneumothorax is identified. The cardiomediastinal silhouette is borderline enlarged. No acute osseous abnormalities are seen. IMPRESSION: 1. Small to moderate right-sided pleural effusion noted. Right basilar airspace opacity may reflect atelectasis or pneumonia. Asymmetric interstitial edema could have a similar appearance. 2. Underlying vascular congestion and borderline cardiomegaly. Electronically Signed   By: Garald Balding M.D.   On: 03/31/2015 01:49   Dg Chest Port 1 View  03/27/2015  CLINICAL DATA:  Fever for 3 days EXAM: PORTABLE CHEST 1 VIEW COMPARISON:  None. FINDINGS: Lungs are clear. Heart is upper normal in size with pulmonary vascularity within normal limits. No adenopathy. No bone lesions. IMPRESSION: No edema or consolidation. Electronically Signed   By: Lowella Grip III M.D.   On: 03/27/2015 15:43   Dg Fluoro Guided Needle Plc Aspiration/injection Loc  03/30/2015  CLINICAL DATA:  Prior MRI suspicious for septic glenohumeral joint. Sepsis. EXAM: FLUORO GUIDED NEEDLE PLACEMENT FLUOROSCOPY TIME:  If the device does not provide the exposure index: Fluoroscopy Time (in minutes and seconds):  1 minutes and 42 seconds Number of Acquired Images:  None COMPARISON:  MRI of  03/29/2015. FINDINGS: Informed written and verbal consent were obtained. Risks and benefits of the procedure were discussed. A "Time out" was performed. The superior medial portion of the right humeral head was localized under fluoroscopic guidance. Skin was prepped and draped in standard sterile fashion. Skin was numbed with 1% lidocaine. A 20 gauge needle was inserted into the joint. No fluid could be aspirated. Intra-articular location was confirmed with approximately 6 cc of Omnipaque 180. Subsequent, approximately 5 cc of saline were injected. Only minimal fluid could be returned. IMPRESSION: Uncomplicated aspiration of the right shoulder, as detailed above. Minimal fluid was obtained, and sent to laboratory as requested. Electronically Signed   By: Abigail Miyamoto M.D.   On: 03/30/2015 12:11        Subjective: Patient requesting occupational therapy to evaluate the patient, otherwise constipation has resolved, blood pressure slightly elevated but patient has no complaints, low-grade fever but no cough no chest pain or shortness of breath  Objective: Filed Vitals:   04/03/15 1536 04/03/15 2124 04/04/15 0554 04/04/15 0955  BP: 163/97 169/87 147/76 170/79  Pulse: 107 115 101 105  Temp:  100.4 F (38 C) 98.4 F (36.9 C)   TempSrc:  Oral Oral   Resp: 20 16 16    Height:      Weight:      SpO2: 100% 99% 99%     Intake/Output Summary (Last 24 hours) at 04/04/15 1315 Last data filed at 04/04/15 [id number]  Gross per 24 hour  Intake   1250 ml  Output      0 ml  Net   1250 ml   Weight change:  Exam:   General:  Pt is alert, follows commands appropriately, not in acute distress  HEENT: No icterus, No thrush, No neck mass, [place]/AT  Cardiovascular: RRR, S1/S2, no rubs, no gallops  Respiratory: Bibasilar crackles, L>R. No wheeze.  Abdomen: Soft/+BS, non tender, non distended, no guarding  Extremities: No edema, No lymphangitis, No petechiae, No rashes, no  synovitis  Data Reviewed: Basic  Metabolic Panel:  Recent Labs Lab 03/31/15 0105 04/01/15 0237 04/02/15 0345 04/03/15 0949 04/04/15 0508  NA 133* 131* 130* 127* 128*  K 3.8 3.7 3.6 3.9 3.9  CL 102 101 98* 96* 100*  CO2 21* 20* 25 20* 23  GLUCOSE 196* 214* 151* 248* 231*  BUN 16 18 17  21* 23*  CREATININE 0.95 1.09* 1.11* 1.06* 1.33*  CALCIUM 8.5* 7.9* 8.0* 7.9* 7.8*   Liver Function Tests:  Recent Labs Lab 03/30/15 0715 03/31/15 0105 04/01/15 0237 04/03/15 0949 04/04/15 0508  AST 52* 36 25 25 21   ALT 24 22 12* 13* 12*  ALKPHOS 161* 114 103 80 74  BILITOT 2.1* 1.2 1.3* 1.1 0.8  PROT 6.2* 6.0* 5.9* 6.3* 6.3*  ALBUMIN 1.8* 1.7* 1.6* 1.4* 1.5*   No results for input(s): LIPASE, AMYLASE in the last 168 hours.  Recent Labs Lab 03/31/15 0105  AMMONIA 49*   CBC:  Recent Labs Lab 03/31/15 0105 04/01/15 0237 04/02/15 0345 04/03/15 0450 04/04/15 0508  WBC 21.2* 19.3* 16.0* 15.0* 12.1*  NEUTROABS  --   --  12.8*  --   --   HGB 7.9* 7.9* 7.3* 8.9* 8.5*  HCT 23.6* 23.2* 22.0* 25.4* 24.1*  MCV 81.1 80.6 82.4 80.9 81.4  PLT 177 169 174 169 196   Cardiac Enzymes: No results for input(s): CKTOTAL, CKMB, CKMBINDEX, TROPONINI in the last 168 hours. BNP: Invalid input(s): POCBNP CBG:  Recent Labs Lab 04/03/15 1304 04/03/15 1700 04/03/15 2122 04/04/15 0814 04/04/15 1145  GLUCAP 185* 181* 195* 272* 199*    Recent Results (from the past 240 hour(s))  Urine culture     Status: None   Collection Time: 03/27/15 12:42 PM  Result Value Ref Range Status   Specimen Description URINE, CLEAN CATCH  Final   Special Requests Normal  Final   Culture   Final    >=100,000 COLONIES/mL GROUP A STREP (S.PYOGENES) ISOLATED   Report Status 03/29/2015 FINAL  Final  Culture, blood (Routine X 2) w Reflex to ID Panel     Status: None   Collection Time: 03/27/15  2:25 PM  Result Value Ref Range Status   Specimen Description BLOOD LEFT ANTECUBITAL  Final   Special Requests BOTTLES DRAWN AEROBIC ONLY 5CC  Final    Culture  Setup Time   Final    GRAM POSITIVE COCCI IN PAIRS IN CHAINS AEROBIC BOTTLE ONLY CRITICAL RESULT CALLED TO, READ BACK BY AND VERIFIED WITH: A. MINTZ,RN AT 0738 ON [id number] BY Rhea Bleacher    Culture   Final    GROUP A STREP (S.PYOGENES) ISOLATED SUSCEPTIBILITIES PERFORMED ON PREVIOUS CULTURE WITHIN THE LAST 5 DAYS.    Report Status 03/30/2015 FINAL  Final  Culture, blood (Routine X 2) w Reflex to ID Panel     Status: None   Collection Time: 03/27/15  3:00 PM  Result Value Ref Range Status   Specimen Description BLOOD RIGHT HAND  Final   Special Requests BOTTLES DRAWN AEROBIC AND ANAEROBIC 5CC  Final   Culture  Setup Time   Final    GRAM POSITIVE COCCI IN PAIRS IN CHAINS IN BOTH AEROBIC AND ANAEROBIC BOTTLES CRITICAL RESULT CALLED TO, READ BACK BY AND VERIFIED WITH: A. MINTZ,RN AT 0848 ON [id number] BY Rhea Bleacher    Culture GROUP A STREP (S.PYOGENES) ISOLATED  Final   Report Status 03/30/2015 FINAL  Final   Organism ID, Bacteria GROUP A STREP (S.PYOGENES) ISOLATED  Final  Susceptibility   Group a strep (s.pyogenes) isolated - MIC*    CLINDAMYCIN >=1 RESISTANT Resistant     AMPICILLIN <=0.25 SENSITIVE Sensitive     ERYTHROMYCIN >=8 RESISTANT Resistant     VANCOMYCIN <=0.12 SENSITIVE Sensitive     CEFTRIAXONE <=0.12 SENSITIVE Sensitive     LEVOFLOXACIN <=0.25 SENSITIVE Sensitive     * GROUP A STREP (S.PYOGENES) ISOLATED  Culture, blood (routine x 2)     Status: None   Collection Time: 03/28/15  3:00 PM  Result Value Ref Range Status   Specimen Description BLOOD RIGHT HAND  Final   Special Requests BOTTLES DRAWN AEROBIC AND ANAEROBIC 5CC  Final   Culture NO GROWTH 5 DAYS  Final   Report Status 04/02/2015 FINAL  Final  Culture, blood (routine x 2)     Status: None   Collection Time: 03/28/15  3:05 PM  Result Value Ref Range Status   Specimen Description BLOOD RIGHT HAND  Final   Special Requests BOTTLES DRAWN AEROBIC ONLY 8.5CC  Final   Culture NO GROWTH 5 DAYS  Final     Report Status 04/02/2015 FINAL  Final  Gram stain     Status: None   Collection Time: 03/30/15 10:52 AM  Result Value Ref Range Status   Specimen Description FLUID SYNOVIAL RIGHT SHOULDER  Final   Special Requests NONE  Final   Gram Stain   Final    MODERATE WBC PRESENT, PREDOMINANTLY PMN FEW GRAM POSITIVE COCCI IN PAIRS IN CHAINS    Report Status 03/30/2015 FINAL  Final  Fungus Culture with Smear     Status: None (Preliminary result)   Collection Time: 03/30/15 10:52 AM  Result Value Ref Range Status   Specimen Description FLUID SYNOVIAL RIGHT SHOULDER  Final   Special Requests NONE  Final   Fungal Smear   Final    NO YEAST OR FUNGAL ELEMENTS SEEN Performed at [facility]    Culture   Final    CULTURE IN PROGRESS FOR FOUR WEEKS Performed at [facility]    Report Status PENDING  Incomplete  Anaerobic culture     Status: None (Preliminary result)   Collection Time: 03/30/15  7:42 PM  Result Value Ref Range Status   Specimen Description SYNOVIAL RIGHT SHOULDER  Final   Special Requests PATIENT ON FOLLOWING PENICILLIN  Final   Gram Stain   Final    ABUNDANT WBC PRESENT, PREDOMINANTLY PMN NO ORGANISMS SEEN    Culture   Final    NO ANAEROBES ISOLATED; CULTURE IN PROGRESS FOR 5 DAYS   Report Status PENDING  Incomplete  Body fluid culture     Status: None   Collection Time: 03/30/15  7:43 PM  Result Value Ref Range Status   Specimen Description SYNOVIAL RIGHT SHOULDER  Final   Special Requests PATIENT ON FOLLOWING PENICILLIN  Final   Gram Stain   Final    ABUNDANT WBC PRESENT, PREDOMINANTLY PMN NO ORGANISMS SEEN    Culture NO GROWTH 3 DAYS  Final   Report Status 04/03/2015 FINAL  Final  MRSA PCR Screening     Status: None   Collection Time: 03/31/15  1:55 AM  Result Value Ref Range Status   MRSA by PCR NEGATIVE NEGATIVE Final    Comment:        The GeneXpert MRSA Assay (FDA approved for NASAL specimens only), is one component of a comprehensive  MRSA colonization surveillance program. It is not intended to diagnose MRSA infection nor  to guide or monitor treatment for MRSA infections.      Scheduled Meds: . sodium chloride   Intravenous Once  . amLODipine  5 mg Oral BID  . docusate sodium  100 mg Oral BID  . ferrous sulfate  325 mg Oral BID WC  . insulin aspart  0-9 Units Subcutaneous TID WC  . insulin aspart  3 Units Subcutaneous TID WC  . metoprolol tartrate  50 mg Oral BID  . midazolam  2 mg Intravenous Once  . pencillin G potassium IV  4 Million Units Intravenous Q4H  . polyethylene glycol  17 g Oral Daily  . senna  2 tablet Oral Daily  . sodium chloride  10-40 mL Intracatheter Q12H   Continuous Infusions: . lactated ringers 10 mL/hr at 03/30/15 1543     Reyne Dumas, M.D. [facility] Pager [phone]  If 7PM-7AM, please contact night-coverage [email or web address] Password [email or web address] 04/04/2015, 1:15 PM   LOS: 8 days

## 2015-04-05 ENCOUNTER — Inpatient Hospital Stay (HOSPITAL_COMMUNITY): Payer: Managed Care, Other (non HMO)

## 2015-04-05 DIAGNOSIS — M25421 Effusion, right elbow: Secondary | ICD-10-CM

## 2015-04-05 LAB — GLUCOSE, CAPILLARY
GLUCOSE-CAPILLARY: 259 mg/dL — AB (ref 65–99)
GLUCOSE-CAPILLARY: 222 mg/dL — AB (ref 65–99)
Glucose-Capillary: 156 mg/dL — ABNORMAL HIGH (ref 65–99)
Glucose-Capillary: 149 mg/dL — ABNORMAL HIGH (ref 65–99)

## 2015-04-05 LAB — ANAEROBIC CULTURE

## 2015-04-05 LAB — CBC
HCT: 24.2 % — ABNORMAL LOW (ref 36.0–46.0)
Hemoglobin: 8.5 g/dL — ABNORMAL LOW (ref 12.0–15.0)
MCH: 28.9 pg (ref 26.0–34.0)
MCHC: 35.1 g/dL (ref 30.0–36.0)
MCV: 82.3 fL (ref 78.0–100.0)
PLATELETS: 250 10*3/uL (ref 150–400)
RBC: 2.94 MIL/uL — ABNORMAL LOW (ref 3.87–5.11)
RDW: 15 % (ref 11.5–15.5)
WBC: 12.1 10*3/uL — ABNORMAL HIGH (ref 4.0–10.5)

## 2015-04-05 LAB — COMPREHENSIVE METABOLIC PANEL
ALT: 16 U/L (ref 14–54)
ANION GAP: 6 (ref 5–15)
AST: 29 U/L (ref 15–41)
Albumin: 1.5 g/dL — ABNORMAL LOW (ref 3.5–5.0)
Alkaline Phosphatase: 72 U/L (ref 38–126)
BUN: 26 mg/dL — ABNORMAL HIGH (ref 6–20)
CHLORIDE: 97 mmol/L — AB (ref 101–111)
CO2: 24 mmol/L (ref 22–32)
Calcium: 7.9 mg/dL — ABNORMAL LOW (ref 8.9–10.3)
Creatinine, Ser: 1.46 mg/dL — ABNORMAL HIGH (ref 0.44–1.00)
GFR, EST AFRICAN AMERICAN: 47 mL/min — AB (ref >60)
GFR, EST NON AFRICAN AMERICAN: 41 mL/min — AB (ref >60)
Glucose, Bld: 223 mg/dL — ABNORMAL HIGH (ref 65–99)
POTASSIUM: 4.4 mmol/L (ref 3.5–5.1)
SODIUM: 127 mmol/L — AB (ref 135–145)
Total Bilirubin: 0.8 mg/dL (ref 0.3–1.2)
Total Protein: 6.7 g/dL (ref 6.5–8.1)

## 2015-04-05 NOTE — Progress Notes (Signed)
Inpatient Diabetes Program Recommendations  AACE/ADA: New Consensus Statement on Inpatient Glycemic Control (2015)  Target Ranges:  Prepandial:   less than 140 mg/dL      Peak postprandial:   less than 180 mg/dL (1-2 hours)      Critically ill patients:  140 - 180 mg/dL   Results for [name], NEIGHBOR (MRN [id number]) as of 04/05/2015 13:32  Ref. Range 04/04/2015 08:14 04/04/2015 11:45 04/04/2015 17:03 04/05/2015 08:35 04/05/2015 12:01  Glucose-Capillary Latest Ref Range: 65-99 mg/dL 272 (H) 199 (H) 138 (H) 259 (H) 222 (H)   Review of Glycemic Control  Diabetes history: DM 2 Current orders for Inpatient glycemic control: Novolog Sensitive TID + Novolog 3 units TID meal coverage  Inpatient Diabetes Program Recommendations: Correction (SSI): Glucose 200's today. Please consider increasing correction to Novolog Moderate.  Thanks,  Tama Headings RN, MSN, [facility] Inpatient Diabetes Coordinator Team Pager [phone] (8a-5p)

## 2015-04-05 NOTE — Care Management Note (Signed)
Case Management Note  Patient Details  Name: Lavera Hubach MRN: [id number] Date of Birth: [birth year]  Subjective/Objective:                 Spoke with patient at the bedside. She confirms that she will be DC'd with HH IV Abx through [facility]. Patient to have [place] PT OT RN HHA through [facility].   Action/Plan:  Verified with [facility] they are following for [facility] referral and HH IV ABX as well. Anticipate DC tomorrow.  Expected Discharge Date:                  Expected Discharge Plan:  [place]  In-House Referral:     Discharge planning Services  CM Consult  Post Acute Care Choice:  Durable Medical Equipment, Home Health Choice offered to:     DME Arranged:  IV pump/equipment DME Agency:  [place] Arranged:  RN, PT, OT, IV Antibiotics, Nurse's Aide [place] Agency:  [place]  Status of Service:  Completed, signed off  Medicare Important Message Given:    Date Medicare IM Given:    Medicare IM give by:    Date Additional Medicare IM Given:    Additional Medicare Important Message give by:     If discussed at [place] of Stay Meetings, dates discussed:    Additional Comments:  Carles Collet, RN 04/05/2015, 2:40 PM

## 2015-04-05 NOTE — Progress Notes (Signed)
Patient complains of increased pain and swelling of the RUE.  Elbow motion is the biggest complaint but pain more throughout the biceps.  Will order MRI of RUE from axilla through elbow to see if any fluid pocket can be localized.  If so, then will see if an IR aspiration of fluid can be done to evaluate for infection.  [place] of RUE negative today.   Diamond Martucci [employer] PA-C

## 2015-04-05 NOTE — Progress Notes (Signed)
VASCULAR LAB PRELIMINARY  PRELIMINARY  PRELIMINARY  PRELIMINARY  Right upper extremity arterial duplex completed.    Preliminary report:  There is no stenosis or abnormality noted in the right upper extremity arterial system.  Excell Neyland, RVT 04/05/2015, 2:45 PM

## 2015-04-05 NOTE — Progress Notes (Signed)
Pharmacy Antibiotic Follow-up Note  [name] is a 50 y.o. year-old female admitted on 03/27/2015.  The patient is currently on day 10 of antibiotics for bacteremia.   Assessment/Plan: -Continue penicillin GK 4 million units q 4 hrs, planned stop date of 04/26/15 (stop date not yet entered) -Continue to monitor renal function.  Planning to f/u in [place] [facility] at discharge.  Temp (24hrs), Avg:99.5 F (37.5 C), Min:98.6 F (37 C), Max:101.1 F (38.4 C)   Recent Labs Lab 04/01/15 0237 04/02/15 0345 04/03/15 0450 04/04/15 0508 04/05/15 0521  WBC 19.3* 16.0* 15.0* 12.1* 12.1*     Recent Labs Lab 04/01/15 0237 04/02/15 0345 04/03/15 0949 04/04/15 0508 04/05/15 0521  CREATININE 1.09* 1.11* 1.06* 1.33* 1.46*   Estimated Creatinine Clearance: 51.5 mL/min (by C-G formula based on Cr of 1.46).    No Known Allergies  Antimicrobials this admission: Zosyn 1/14 x 1 Dapto 1/14>>1/16 Pen G 1/16>> Ancef 1/17 x3 doses (post op)  Levels/dose changes this admission: daptomycin dose changed 1/16 to 480mg  IV q24h from 476.5mg  IV q48h for ease of compounding as well as improvement in renal function.  1/15 CK = 179  Microbiology results: 1/14 BCx: group A strep 1/14 UC: >100K group A strep 1/15 BCx: neg 1/17 synovial fluid: neg 1/21 BCx: ngtd Thank you for allowing pharmacy to be a part of this patient's care.  Demetry Bendickson D. Bryen Hinderman, PharmD, BCPS Clinical Pharmacist Pager: [phone] 04/05/2015 1:27 PM

## 2015-04-05 NOTE — Progress Notes (Signed)
VASCULAR LAB PRELIMINARY  PRELIMINARY  PRELIMINARY  PRELIMINARY  Right upper extremity venous duple completed.    Preliminary report:  There is no DVT noted in the right upper extremity.  There is superficial thrombosis noted in the basilic vein of the right upper extremity. There is interstitial fluid noted throughout.  Jax Abdelrahman, RVT 04/05/2015, 2:46 PM

## 2015-04-05 NOTE — Progress Notes (Signed)
Occupational Therapy Re-Evaluation Patient Details Name: [name] MRN: [id number] DOB: 01/13/1966 Today's Date: 04/05/2015    History of Present Illness 49/F with DM, HTN admitted with sepsis, AKI, found to have GAS bacteremia, R shoulder Septic arthritis, NSVT, ID and Cards following with pt with I& D right shoulder.    Clinical Impression   Pt seen last week prior to PICC placement with anticipated D/C that pm. Pt remains in the hospital. RUE with increased pain and edema as compared to initial eval. Noted vascular results. Dr. Allyson Sabal gave OK to continue with RUE ROM without restrictions. Completed retrograde massage R hand with good results. Pt need to keep RUE elevated on 3 pillow adn frequently use squeeze ball to reduce edema. Nsg notified. Recommend use of ice on shoulder. Will follow acutely to address goals. Pt will need HHOT after D/C. Pt very appreciative.     Follow Up Recommendations  Home health OT;Supervision/Assistance - 24 hour (initially)    Equipment Recommendations  Tub/shower seat    Recommendations for Other Services       Precautions / Restrictions Precautions Precautions: Fall;Other (comment) (R superior thombisis basilic vein [id number]) Restrictions RUE Weight Bearing: Weight bearing as tolerated      Mobility Bed Mobility                  Transfers                      Balance                                            ADL Overall ADL's : Needs assistance/impaired     Grooming: Moderate assistance   Upper Body Bathing: Moderate assistance       Upper Body Dressing : Moderate assistance                     General ADL Comments: Pt requires mod A for ADL due to limited ROM and pain.     Vision     Perception     Praxis      Pertinent Vitals/Pain Pain Assessment: 0-10 Pain Score: 8  Pain Location: RUE Pain Descriptors / Indicators: Aching Pain Intervention(s): Limited activity  within patient's tolerance;Monitored during session;Ice applied;Repositioned     Hand Dominance Right   Extremity/Trunk Assessment Upper Extremity Assessment Upper Extremity Assessment: RUE deficits/detail RUE Deficits / Details: Limited active movement RUE due to pain. PROM elbow/hand/wrist WFL. Shoulder ROM limited due to awaiting clearance form MD regarding vascular ultrasound. Increased edema throughout RUE as compared to RUE on eval. Decreaed active movement with R ellblw. Increased dependent edema R hand. Decreased movemetn overall RUE as compared to eval. Not using RUE functionally at this time.  RUE Coordination: decreased fine motor;decreased gross motor   Lower Extremity Assessment Lower Extremity Assessment: Defer to PT evaluation   Cervical / Trunk Assessment Cervical / Trunk Assessment: Normal   Communication     Cognition Arousal/Alertness: Awake/alert Behavior During Therapy:  (tearful) Overall Cognitive Status: Within Functional Limits for tasks assessed                     General Comments       Exercises   Other Exercises Other Exercises: retrograde massage R hand with good response Other Exercises: educted pt on need to keep RUE elevated  on 3 pillows with hand above heart Other Exercises: Ice to R shoulder   Shoulder Instructions      Home Living Family/patient expects to be discharged to:: Private residence Living Arrangements: Spouse/significant other;Children Available Help at Discharge: Family;Available PRN/intermittently Type of Home: House Home Access: Stairs to enter [employer] of Steps: 3 Entrance Stairs-Rails: Right Home Layout: Two level;Able to live on main level with bedroom/bathroom Alternate Level Stairs-Number of Steps: flight   Bathroom Shower/Tub: Teacher, early years/pre: Standard Bathroom Accessibility: Yes How Accessible: Accessible via walker Home Equipment: Shower seat          Prior  Functioning/Environment Level of Independence: Independent             OT Diagnosis: Generalized weakness;Acute pain   OT Problem List: Decreased strength;Decreased range of motion;Decreased activity tolerance;Impaired UE functional use;Pain;Increased edema   OT Treatment/Interventions: Self-care/ADL training;Therapeutic exercise;DME and/or AE instruction;Therapeutic activities;Patient/family education    OT Goals(Current goals can be found in the care plan section) Acute Rehab OT Goals Patient Stated Goal: to get better OT Goal Formulation: With patient Time For Goal Achievement: 04/19/15 Potential to Achieve Goals: Good ADL Goals Pt/caregiver will Perform Home Exercise Program:  (pt/family independent with HEP) Additional ADL Goal #3: Pt/family will verbalize understanding of use of elevation/ice for RUE t reduce edema Additional ADL Goal #4: Pt/family will demosntrate understanding of retrograde massage R hand to reduce dependent edema.  OT Frequency: Min 3X/week   Barriers to D/C:            Co-evaluation              End of Session Nurse Communication: Other (comment) (elevation and ice R UE)  Activity Tolerance: Patient limited by pain Patient left: in bed;with call bell/phone within reach   Time: [id number] OT Time Calculation (min): 23 min Charges:  OT General Charges $OT Visit: 1 Procedure OT Evaluation $OT Re-eval: 1 Procedure OT Treatments $Therapeutic Activity: 8-22 mins G-Codes:    Nel Stoneking,HILLARY Apr 13, 2015, 3:56 PM   [facility], OTR/L  [phone] 04/13/15

## 2015-04-05 NOTE — Progress Notes (Signed)
PROGRESS NOTE  [name] [id number] DOB: 19-Aug-1965 DOA: 03/27/2015 PCP: Candice Hams, MD   [name] is a 50 y.o. female with hx of DM (no meds) , HTN and uterine fibroids who had sudden onset of rigors 2 days ago followed by epigastric pain, nausea, vomiting and mild diarrhea. She then began to have severe left-sided posterior neck pain leading her to come to the hospital yesterday. She had low-grade fever of 100.5 and acute renal insufficiency. Her chest x-ray was negative. Abdominal ultrasound was normal with the exception of questionable cholelithiasis. Her urinalysis did show some pyuria but she has not been having any urinary tract symptoms. She was started on empiric daptomycin and piperacillin tazobactam (vancomycin was avoided because of concerns about potential nephrotoxicity). Both admission blood cultures are already growing gram-positive cocci in pairs and chains. Her abdominal pain, nausea, vomiting and diarrhea have completely resolved. She continues to have left-sided neck pain but it is slightly better. Over the past 24 hours she has developed joint pains. It is most severe in her right shoulder, right elbow and right wrist but she also has some milder pain in her left shoulder and right knee.   Assessment/Plan: Sepsis with bacteremia -present at the time of admission Disseminated GAS bacteremia - on penicillin and WBC improved, seems to be improving overall.  Treat with penicillin (if available in a pump) for 4 weeks total from surgery 1/17 through February 13th.  Weekly cmp, cbc to [name] Infectious disease will arrange follow up in [name] in about 2 weeks Discussed on multiple occasions with Candice. Leane [name]. [name], [name]  about continued low-grade fevers, he does not recommend any changes in treatment or any further testing at this time.   GAS Bacteremia with Septic Arthritis of R shoulder -Was on IV Daptomycin, now IV Penicillin since 1/16 per  ID, patient needs a total of 4 weeks of IV penicillin as above -had some sore throat prior to admission and son had strep throat 2weeks ago. -ID and Cardiology were consulted for bacteremia Bacteremia Identified as S Pyogenes, cardiology was consulted for possible TEE, however this was canceled per ID recommendations , Can reschedule if blood cultures remain positive , blood cultures from 1/17  no growth so far -per ID unlikely to have endocarditis, hence patient did not have TEE,  Due to Severe pain and swelling of R shoudler got MRi  and patient found to have septic arthritis  -Arthrocentesis by IR--gram stain GPC in pairs and chains in R shoulder synovial fluid, c/w seeding and septic arthritis from bacteremia (culture neg) -Ortho consulted, d/w Candice.Tim Percell [name], patient is status post arthroscopy, irrigation and debridement of right shoulder on 1/17 -04/01/2015--infectious disease ordered [name] line--husband requested IR to place a tunnel catheter in the IJ due to his concerns of vein sclerosis presumptive need of dialysis although the patient has normal renal function at this time  Patient now has right IJ tunneled cuffed hickman placement interventional radiology Candice Mckusick, DO recommended  not submerge right IJ for 7 days  Right upper extremity swelling Patient noted to have arterial and venous Doppler to rule out arterial thrombus, DVT Also notified orthopedic surgery to evaluate the right shoulder This could be the cause of patient's fever yesterday  Anemia of chronic disease Patient has received a total of 2 units of packed red blood cells during this admission Follow CBC, hemoglobin has been stable for 3 days Anemia panel shows iron deficiency and  the patient has been started on iron supplementation Patient is to follow-up at [facility] gastroenterology for endoscopic evaluation once her bacteremia resolves   Right lower lobe infiltrate/HCAP? -Patient is small to moderate-sized  pleural effusion, possible right basilar air space opacity, atelectasis versus pneumonia,   white count improving 21.2 > 15 Patient currently on  penicillin G Based on the chest x-ray [name] , has not recommended any changes in her antibiotic treatment PCCM also consulted during this admission, no evidence of pleural effusion on ultrasound. According to the recommendation Continue abx for bacteremia as per ID. No need for any additional coverage Family requested transfer to [place], discussed with hospitalist ([name]) there who has not accepted patient as they are on diversion due to unavailability of beds , husband aware that patient has not been accepted due to no medical indication for transfer  ABG 7.47, 29.7, 102   AKI  -Resolved 5.26>1.4 -due to sepsis, poor Po intake, NSAIDs, ARB -Baseline creatinine thought to be within normal limits,   -Nephrology consulted. Ultrasounds of abdomen and pelvis showed signs of medico renal disease and no signs of obstruction. -creatinine normalized, IV fluids have been discontinued, hold lasix as she appears euvolemic    R shoulder Septic arthritis -see above -Secondary to disseminated Streptococcus biology needs bacteremia work on ROM of R shoulder and gait training Follow-up with orthopedics in 7 days  DM2--controlled -metformin on hold, SSI, Hbaic is 7  Nonsustained ventricular tachycardia -had an episode of 30 beats 1/15 pm -continue metoprolol, replaced K and Mag -2d ECHO with normal EF and wall motion;  no structural abnormalities -Cards signed off 1/20  Polyarthritis -See discussion on R shoulder septic arthritis as above, has inflammation of R elbow, wrist, L shoulder, elbow, wrist, much less pronounced than R shoulder -unable to use NSAIDs and steroids in setting of infection and acute kidney injury  Elevated Bili -Secondary to sepsis and cholestasis, ALP, AST/ALT WNL -improving  HTN -stable, holding ARB due to  AKI\ -Elevation partly due to pain -Metoprolol tartrate  25 mg every 6 hours, amlodipine added  Elevated B-HCG,  -she is not pregnant, LMP>1year ago, menopausal now, Pelvic US negative for gestational sac -suspect this could be from her pituitary gland, needs to be followed, GYN As outpatient  Anemia of chronic disease,  -hemoglobin 7.9, >7.3, FOBT positive  Received a total of 2 units of packed red blood cells Anemia panel consistent with anemia of chronic disease, relative iron deficiency Patient's iron supplementation Hemoglobin improved to 8.9 after 2 units, today 8.5, no active bleeding Patient would need follow-up at [facility] gastroenterology  In the outpt setting    Family Communication:   Husband updated at beside Disposition Plan:   Not stable for discharge given right arm swelling       Procedures/Studies: US Soft Tissue Head/neck  03/27/2015  CLINICAL DATA:  Left-sided neck pain EXAM: ULTRASOUND LEFT NECK TECHNIQUE: Longitudinal and transverse images of the left neck region in the area of pain performed COMPARISON:  None. FINDINGS: Sonographic images of the left neck show no mass or inflammatory focus. No abnormal fluid collection. In particular, no abscess. Several tiny lymph nodes are noted in this area; by size criteria, there is no adenopathy in this area. Vascular structures of this area are widely patent. IMPRESSION: Scattered subcentimeter lymph nodes in the left neck, not meeting size criteria for pathologic significance. No mass or inflammatory focus appreciable. No abscess in particular. Electronically Signed   By: Gwyndolyn Saxon  Jasmine December III M.D.   On: 03/27/2015 17:19   [place] Chest  03/31/2015  CLINICAL DATA:  51 year old female with chest x-ray demonstrating right-sided small pleural effusion. EXAM: CHEST ULTRASOUND COMPARISON:  None. FINDINGS: Ultrasound survey of the right chest demonstrating small complex pleural effusion, not amenable to ultrasound-guided drainage.  IMPRESSION: Small, complex appearing right pleural effusion not amenable for ultrasound-guided thoracentesis. Signed, Dulcy Fanny. Earleen Newport, DO [facility] Radiology Electronically Signed   By: [name] D.O.   On: 03/31/2015 11:05   US Abdomen Complete  03/27/2015  CLINICAL DATA:  Septic, generalized upper and lower abdominal pain, positive pregnancy test EXAM: ABDOMEN ULTRASOUND COMPLETE COMPARISON:  None FINDINGS: Gallbladder: Dependent echogenic material with minimal shadowing likely calculi in gallbladder. No gallbladder wall thickening, pericholecystic fluid, or sonographic Murphy sign. Common bile duct: Diameter: Normal caliber 4 mm diameter Liver: Normal appearance IVC: Normal appearance Pancreas: Distal tail obscured by bowel gas. Visualized portion normal appearance. Spleen: Normal appearance, 8.5 cm length Right Kidney: Length: 12.5 cm. Increased cortical echogenicity. Normal cortical thickness. No mass or hydronephrosis. Left Kidney: Length: 12.7 cm. Increased cortical echogenicity. Normal cortical thickness. No mass or hydronephrosis. Abdominal aorta: Normal caliber Other findings: No free fluid IMPRESSION: Probable medical renal disease changes. Probable cholelithiasis without evidence acute cholecystitis. Remainder of exam unremarkable. Electronically Signed   By: Lavonia Dana M.D.   On: 03/27/2015 17:29   US Ob Comp Less 14 Wks  03/27/2015  CLINICAL DATA:  Low beta HCG of 27. Abdominal pain with sepsis. Elevated white blood cell count. EXAM: OBSTETRIC <14 WK [place] AND TRANSVAGINAL OB US TECHNIQUE: Both transabdominal and transvaginal ultrasound examinations were performed for complete evaluation of the gestation as well as the maternal uterus, adnexal regions, and pelvic cul-de-sac. Transvaginal technique was performed to assess early pregnancy. COMPARISON:  Ultrasound 02/13/2012 FINDINGS: Intrauterine gestational sac: None Yolk sac:  Not present  Embryo:  Not present Maternal uterus/adnexae: The uterus is lobular and heterogeneous echotexture consistent multiple leiomyoma. This appears similar to the ultrasound of 2013. The endometrium is difficult to identified on the background of the multiple leiomyoma but measures 8 mm. Additionally, the ovaries are not identified. There is reported history of a LEFT oophorectomy in 2008. No free fluid. IMPRESSION: 1. Leiomyomatous uterus similar to 2013. 2. No decidual reaction or gestational sac identified. 3. No intrauterine gestational sac, yolk sac, or fetal pole identified. Differential considerations include intrauterine pregnancy too early to be sonographically visualized, missed abortion, or ectopic pregnancy. Followup ultrasound is recommended in 10-14 days for further evaluation. Electronically Signed   By: Suzy Bouchard M.D.   On: 03/27/2015 18:07   US Ob Transvaginal  03/27/2015  CLINICAL DATA:  Low beta HCG of 27. Abdominal pain with sepsis. Elevated white blood cell count. EXAM: OBSTETRIC <14 WK [place] AND TRANSVAGINAL OB US TECHNIQUE: Both transabdominal and transvaginal ultrasound examinations were performed for complete evaluation of the gestation as well as the maternal uterus, adnexal regions, and pelvic cul-de-sac. Transvaginal technique was performed to assess early pregnancy. COMPARISON:  Ultrasound 02/13/2012 FINDINGS: Intrauterine gestational sac: None Yolk sac:  Not present Embryo:  Not present Maternal uterus/adnexae: The uterus is lobular and heterogeneous echotexture consistent multiple leiomyoma. This appears similar to the ultrasound of 2013. The endometrium is difficult to identified on the background of the multiple leiomyoma but measures 8 mm. Additionally, the ovaries are not identified. There is reported history of a LEFT oophorectomy in 2008. No free fluid. IMPRESSION: 1. Leiomyomatous uterus similar  to 2013. 2. No decidual reaction or gestational sac identified. 3. No intrauterine  gestational sac, yolk sac, or fetal pole identified. Differential considerations include intrauterine pregnancy too early to be sonographically visualized, missed abortion, or ectopic pregnancy. Followup ultrasound is recommended in 10-14 days for further evaluation. Electronically Signed   By: Suzy Bouchard M.D.   On: 03/27/2015 18:07   Mr Shoulder Right Wo Contrast  03/30/2015  CLINICAL DATA:  Joint pain most severe in the right shoulder. Sepsis. EXAM: MRI OF THE RIGHT SHOULDER WITHOUT CONTRAST TECHNIQUE: Multiplanar, multisequence MR imaging of the shoulder was performed. No intravenous contrast was administered. COMPARISON:  03/27/2015 FINDINGS: Rotator cuff: Prominent partial thickness articular surface tearing of the supraspinatus, with the articular surface portion retracted 1.7 cm on image 16 series 5, compatible with tendon delamination. Moderate tendinopathy of the remaining supraspinatus tendon. Similarly there is partial thickness articular surface tearing of the infraspinatus tendon with moderate infraspinatus tendinopathy. Moderate subscapularis tendinopathy is present along with potential partial thickness articular surface tearing Muscles: Abnormal edema tracks primarily along the marginal portions of the supraspinatus, subscapularis, teres minor, and teres major muscles. There is also low-level edema along the superficial fascia margin of the lateral deltoid. Biceps long head: Partial tearing or advanced tendinopathy of the intra-articular segment. Acromioclavicular Joint: Mild degenerative AC joint arthropathy. Subacromial morphology is type 2 (curved). Small but abnormal amount of fluid in the subacromial subdeltoid bursa. Glenohumeral Joint: Prominent glenohumeral joint effusion. Moderate degenerative chondral thinning along the glenohumeral joint. Labrum: Linear irregularity in the superior labrum with a blunted appearance, compatible with SLAP tear extending into the biceps anchor. Bones:  Unusual flaring of the proximal humeral metadiaphysis, query prior fracture. Small right axillary lymph nodes are observed. IMPRESSION: 1. Large shoulder joint effusion with abnormal edema tracking within and along the margins of the regional musculature. In the setting of sepsis and new onset shoulder pain, there is a high degree of concern for septic glenohumeral joint, and arthrocentesis is likely indicated. 2. Partial thickness articular surface tearing of the supraspinatus and infraspinatus with tendon delamination. There is potentially mild partial thickness articular surface tearing of the subscapularis. Considerable tendinopathy of these tendons as well. 3. Advanced tendinopathy or partial tearing of the long head of the biceps. 4. Increased signal in the superior labrum compatible with SLAP tear extending into the biceps anchor. 5. Subacromial subdeltoid bursitis. 6. Somewhat unusual flaring of the proximal humeral metadiaphysis, query prior fracture. 7. Moderate degenerative glenohumeral arthropathy. These results will be called to the ordering clinician or representative by the Radiologist Assistant, and communication documented in the PACS or zVision Dashboard. Electronically Signed   By: Van Clines M.D.   On: 03/30/2015 07:28   Ir Fluoro Guide Cv Line Right  04/01/2015  CLINICAL DATA:  50 year old female with a history of septic arthritis. She has been referred for tunneled catheter placement for antibiotics. EXAM: IR RIGHT FLOURO GUIDE CV LINE; IR ULTRASOUND GUIDANCE VASC ACCESS RIGHT Date: 04/01/2015 ANESTHESIA/SEDATION: None Total sedation time: 0 minutes FLUOROSCOPY TIME:  30 second TECHNIQUE: The procedure, risks, benefits, and alternatives were explained to the patient. Questions regarding the procedure were encouraged and answered. The patient understands and consents to the procedure. The right neck and chest was prepped with chlorhexidine, and draped in the usual sterile fashion using  maximum barrier technique (cap and mask, sterile gown, sterile gloves, large sterile sheet, hand hygiene and cutaneous antiseptic). Local anesthesia was attained by infiltration with 1% lidocaine without epinephrine. Ultrasound demonstrated patency  of the right internal jugular vein, and this was documented with an image. Under real-time ultrasound guidance, this vein was accessed with a 21 gauge micropuncture needle and image documentation was performed. A small dermatotomy was made at the access site with an 11 scalpel. A 0.018" wire was advanced into the SVC and the access needle exchanged for a 681F micropuncture vascular sheath. The 0.018" wire was then removed and a 0.035" wire advanced into the IVC. Peel-away sheath was placed over the wire, and upon removal of the wire appropriate internal length was obtained/measured. Skin and subcutaneous tissues on the right chest wall just inferior to the clavicle were infiltrated with 1% lidocaine for local anesthesia. Small stab incision was made with 11 blade scalpel. The catheter was back tunneled to the incision at the neck, with the catheter pulled through the subcutaneous tunnel. Catheter was then ligated at the appropriate length at the neck and passed through the peel-away sheath. Peel-away sheath was removed. Final image was stored. Catheter was then sutured onto the right chest wall with retention sutures. The dermatotomy at the venous access site was also closed with Dermabond. Patient tolerated the procedure well and remained hemodynamically stable throughout. No complications were encountered and no significant blood loss was encountered. COMPLICATIONS: None IMPRESSION: Status post placement of right IJ tunneled catheter measuring 24 centimeter. Catheter ready for use. Signed, Dulcy Fanny. Earleen Newport, DO [facility] Radiology Electronically Signed   By: [name] D.O.   On: 04/01/2015 14:45   Ir US Guide Vasc  Access Right  04/01/2015  CLINICAL DATA:  50 year old female with a history of septic arthritis. She has been referred for tunneled catheter placement for antibiotics. EXAM: IR RIGHT FLOURO GUIDE CV LINE; IR ULTRASOUND GUIDANCE VASC ACCESS RIGHT Date: 04/01/2015 ANESTHESIA/SEDATION: None Total sedation time: 0 minutes FLUOROSCOPY TIME:  30 second TECHNIQUE: The procedure, risks, benefits, and alternatives were explained to the patient. Questions regarding the procedure were encouraged and answered. The patient understands and consents to the procedure. The right neck and chest was prepped with chlorhexidine, and draped in the usual sterile fashion using maximum barrier technique (cap and mask, sterile gown, sterile gloves, large sterile sheet, hand hygiene and cutaneous antiseptic). Local anesthesia was attained by infiltration with 1% lidocaine without epinephrine. Ultrasound demonstrated patency of the right internal jugular vein, and this was documented with an image. Under real-time ultrasound guidance, this vein was accessed with a 21 gauge micropuncture needle and image documentation was performed. A small dermatotomy was made at the access site with an 11 scalpel. A 0.018" wire was advanced into the SVC and the access needle exchanged for a 681F micropuncture vascular sheath. The 0.018" wire was then removed and a 0.035" wire advanced into the IVC. Peel-away sheath was placed over the wire, and upon removal of the wire appropriate internal length was obtained/measured. Skin and subcutaneous tissues on the right chest wall just inferior to the clavicle were infiltrated with 1% lidocaine for local anesthesia. Small stab incision was made with 11 blade scalpel. The catheter was back tunneled to the incision at the neck, with the catheter pulled through the subcutaneous tunnel. Catheter was then ligated at the appropriate length at the neck and passed through the peel-away sheath. Peel-away sheath was removed.  Final image was stored. Catheter was then sutured onto the right chest wall with retention sutures. The dermatotomy at the venous access site was also closed with Dermabond. Patient tolerated the procedure well and remained  hemodynamically stable throughout. No complications were encountered and no significant blood loss was encountered. COMPLICATIONS: None IMPRESSION: Status post placement of right IJ tunneled catheter measuring 24 centimeter. Catheter ready for use. Signed, Dulcy Fanny. Earleen Newport, DO [facility] Radiology Electronically Signed   By: [name] D.O.   On: 04/01/2015 14:45   Dg Chest Port 1 View  03/31/2015  CLINICAL DATA:  Acute onset of shortness of breath. Initial encounter. EXAM: PORTABLE CHEST 1 VIEW COMPARISON:  Chest radiograph from 03/27/2015 FINDINGS: A small to moderate right-sided pleural effusion is noted. Right basilar airspace opacity may reflect atelectasis or pneumonia. Asymmetric interstitial edema could have a similar appearance. Underlying vascular congestion is seen. No pneumothorax is identified. The cardiomediastinal silhouette is borderline enlarged. No acute osseous abnormalities are seen. IMPRESSION: 1. Small to moderate right-sided pleural effusion noted. Right basilar airspace opacity may reflect atelectasis or pneumonia. Asymmetric interstitial edema could have a similar appearance. 2. Underlying vascular congestion and borderline cardiomegaly. Electronically Signed   By: Garald Balding M.D.   On: 03/31/2015 01:49   Dg Chest Port 1 View  03/27/2015  CLINICAL DATA:  Fever for 3 days EXAM: PORTABLE CHEST 1 VIEW COMPARISON:  None. FINDINGS: Lungs are clear. Heart is upper normal in size with pulmonary vascularity within normal limits. No adenopathy. No bone lesions. IMPRESSION: No edema or consolidation. Electronically Signed   By: Lowella Grip III M.D.   On: 03/27/2015 15:43   Dg Fluoro Guided Needle Plc  Aspiration/injection Loc  03/30/2015  CLINICAL DATA:  Prior MRI suspicious for septic glenohumeral joint. Sepsis. EXAM: FLUORO GUIDED NEEDLE PLACEMENT FLUOROSCOPY TIME:  If the device does not provide the exposure index: Fluoroscopy Time (in minutes and seconds):  1 minutes and 42 seconds Number of Acquired Images:  None COMPARISON:  MRI of 03/29/2015. FINDINGS: Informed written and verbal consent were obtained. Risks and benefits of the procedure were discussed. A "Time out" was performed. The superior medial portion of the right humeral head was localized under fluoroscopic guidance. Skin was prepped and draped in standard sterile fashion. Skin was numbed with 1% lidocaine. A 20 gauge needle was inserted into the joint. No fluid could be aspirated. Intra-articular location was confirmed with approximately 6 cc of Omnipaque 180. Subsequent, approximately 5 cc of saline were injected. Only minimal fluid could be returned. IMPRESSION: Uncomplicated aspiration of the right shoulder, as detailed above. Minimal fluid was obtained, and sent to laboratory as requested. Electronically Signed   By: Abigail Miyamoto M.D.   On: 03/30/2015 12:11        Subjective: Patient had a fever of 101.1 last night, complaining of right upper extremity swelling  Objective: Filed Vitals:   04/04/15 2155 04/04/15 2310 04/05/15 0459 04/05/15 0900  BP: 160/80  162/95 168/101  Pulse: 113  99 108  Temp: 101.1 F (38.4 C) 98.6 F (37 C) 98.6 F (37 C)   TempSrc: Oral Oral Oral   Resp: 18  16   Height:      Weight:      SpO2: 96%  95%     Intake/Output Summary (Last 24 hours) at 04/05/15 1229 Last data filed at 04/04/15 1855  Gross per 24 hour  Intake    750 ml  Output      0 ml  Net    750 ml   Weight change:  Exam:   General:  Pt is alert, follows commands appropriately, not in acute distress  HEENT: No icterus,  No thrush, No neck mass, [place]/AT  Cardiovascular: RRR, S1/S2, no rubs, no gallops  Respiratory:  Bibasilar crackles, L>R. No wheeze.  Abdomen: Soft/+BS, non tender, non distended, no guarding  Extremities: No edema, No lymphangitis, No petechiae, No rashes, no synovitis  Data Reviewed: Basic Metabolic Panel:  Recent Labs Lab 04/01/15 0237 04/02/15 0345 04/03/15 0949 04/04/15 0508 04/05/15 0521  NA 131* 130* 127* 128* 127*  K 3.7 3.6 3.9 3.9 4.4  CL 101 98* 96* 100* 97*  CO2 20* 25 20* 23 24  GLUCOSE 214* 151* 248* 231* 223*  BUN 18 17 21* 23* 26*  CREATININE 1.09* 1.11* 1.06* 1.33* 1.46*  CALCIUM 7.9* 8.0* 7.9* 7.8* 7.9*   Liver Function Tests:  Recent Labs Lab 03/31/15 0105 04/01/15 0237 04/03/15 0949 04/04/15 0508 04/05/15 0521  AST 36 25 25 21 29   ALT 22 12* 13* 12* 16  ALKPHOS 114 103 80 74 72  BILITOT 1.2 1.3* 1.1 0.8 0.8  PROT 6.0* 5.9* 6.3* 6.3* 6.7  ALBUMIN 1.7* 1.6* 1.4* 1.5* 1.5*   No results for input(s): LIPASE, AMYLASE in the last 168 hours.  Recent Labs Lab 03/31/15 0105  AMMONIA 49*   CBC:  Recent Labs Lab 04/01/15 0237 04/02/15 0345 04/03/15 0450 04/04/15 0508 04/05/15 0521  WBC 19.3* 16.0* 15.0* 12.1* 12.1*  NEUTROABS  --  12.8*  --   --   --   HGB 7.9* 7.3* 8.9* 8.5* 8.5*  HCT 23.2* 22.0* 25.4* 24.1* 24.2*  MCV 80.6 82.4 80.9 81.4 82.3  PLT 169 174 169 196 250   Cardiac Enzymes: No results for input(s): CKTOTAL, CKMB, CKMBINDEX, TROPONINI in the last 168 hours. BNP: Invalid input(s): POCBNP CBG:  Recent Labs Lab 04/04/15 0814 04/04/15 1145 04/04/15 1703 04/05/15 0835 04/05/15 1201  GLUCAP 272* 199* 138* 259* 222*    Recent Results (from the past 240 hour(s))  Urine culture     Status: None   Collection Time: 03/27/15 12:42 PM  Result Value Ref Range Status   Specimen Description URINE, CLEAN CATCH  Final   Special Requests Normal  Final   Culture   Final    >=100,000 COLONIES/mL GROUP A STREP (S.PYOGENES) ISOLATED   Report Status 03/29/2015 FINAL  Final  Culture, blood (Routine X 2) w Reflex to ID Panel      Status: None   Collection Time: 03/27/15  2:25 PM  Result Value Ref Range Status   Specimen Description BLOOD LEFT ANTECUBITAL  Final   Special Requests BOTTLES DRAWN AEROBIC ONLY 5CC  Final   Culture  Setup Time   Final    GRAM POSITIVE COCCI IN PAIRS IN CHAINS AEROBIC BOTTLE ONLY CRITICAL RESULT CALLED TO, READ BACK BY AND VERIFIED WITH: A. MINTZ,RN AT 0738 ON [id number] BY Rhea Bleacher    Culture   Final    GROUP A STREP (S.PYOGENES) ISOLATED SUSCEPTIBILITIES PERFORMED ON PREVIOUS CULTURE WITHIN THE LAST 5 DAYS.    Report Status 03/30/2015 FINAL  Final  Culture, blood (Routine X 2) w Reflex to ID Panel     Status: None   Collection Time: 03/27/15  3:00 PM  Result Value Ref Range Status   Specimen Description BLOOD RIGHT HAND  Final   Special Requests BOTTLES DRAWN AEROBIC AND ANAEROBIC 5CC  Final   Culture  Setup Time   Final    GRAM POSITIVE COCCI IN PAIRS IN CHAINS IN BOTH AEROBIC AND ANAEROBIC BOTTLES CRITICAL RESULT CALLED TO, READ BACK BY AND VERIFIED WITH: A. MINTZ,RN  AT 0848 ON [id number] BY Rhea Bleacher    Culture GROUP A STREP (S.PYOGENES) ISOLATED  Final   Report Status 03/30/2015 FINAL  Final   Organism ID, Bacteria GROUP A STREP (S.PYOGENES) ISOLATED  Final      Susceptibility   Group a strep (s.pyogenes) isolated - MIC*    CLINDAMYCIN >=1 RESISTANT Resistant     AMPICILLIN <=0.25 SENSITIVE Sensitive     ERYTHROMYCIN >=8 RESISTANT Resistant     VANCOMYCIN <=0.12 SENSITIVE Sensitive     CEFTRIAXONE <=0.12 SENSITIVE Sensitive     LEVOFLOXACIN <=0.25 SENSITIVE Sensitive     * GROUP A STREP (S.PYOGENES) ISOLATED  Culture, blood (routine x 2)     Status: None   Collection Time: 03/28/15  3:00 PM  Result Value Ref Range Status   Specimen Description BLOOD RIGHT HAND  Final   Special Requests BOTTLES DRAWN AEROBIC AND ANAEROBIC 5CC  Final   Culture NO GROWTH 5 DAYS  Final   Report Status 04/02/2015 FINAL  Final  Culture, blood (routine x 2)     Status: None   Collection  Time: 03/28/15  3:05 PM  Result Value Ref Range Status   Specimen Description BLOOD RIGHT HAND  Final   Special Requests BOTTLES DRAWN AEROBIC ONLY 8.5CC  Final   Culture NO GROWTH 5 DAYS  Final   Report Status 04/02/2015 FINAL  Final  Gram stain     Status: None   Collection Time: 03/30/15 10:52 AM  Result Value Ref Range Status   Specimen Description FLUID SYNOVIAL RIGHT SHOULDER  Final   Special Requests NONE  Final   Gram Stain   Final    MODERATE WBC PRESENT, PREDOMINANTLY PMN FEW GRAM POSITIVE COCCI IN PAIRS IN CHAINS    Report Status 03/30/2015 FINAL  Final  Fungus Culture with Smear     Status: None (Preliminary result)   Collection Time: 03/30/15 10:52 AM  Result Value Ref Range Status   Specimen Description FLUID SYNOVIAL RIGHT SHOULDER  Final   Special Requests NONE  Final   Fungal Smear   Final    NO YEAST OR FUNGAL ELEMENTS SEEN Performed at [facility]    Culture   Final    CULTURE IN PROGRESS FOR FOUR WEEKS Performed at [facility]    Report Status PENDING  Incomplete  Anaerobic culture     Status: None   Collection Time: 03/30/15  7:42 PM  Result Value Ref Range Status   Specimen Description SYNOVIAL RIGHT SHOULDER  Final   Special Requests PATIENT ON FOLLOWING PENICILLIN  Final   Gram Stain   Final    ABUNDANT WBC PRESENT, PREDOMINANTLY PMN NO ORGANISMS SEEN    Culture NO ANAEROBES ISOLATED  Final   Report Status 04/05/2015 FINAL  Final  Body fluid culture     Status: None   Collection Time: 03/30/15  7:43 PM  Result Value Ref Range Status   Specimen Description SYNOVIAL RIGHT SHOULDER  Final   Special Requests PATIENT ON FOLLOWING PENICILLIN  Final   Gram Stain   Final    ABUNDANT WBC PRESENT, PREDOMINANTLY PMN NO ORGANISMS SEEN    Culture NO GROWTH 3 DAYS  Final   Report Status 04/03/2015 FINAL  Final  MRSA PCR Screening     Status: None   Collection Time: 03/31/15  1:55 AM  Result Value Ref Range Status   MRSA by PCR  NEGATIVE NEGATIVE Final    Comment:  The GeneXpert MRSA Assay (FDA approved for NASAL specimens only), is one component of a comprehensive MRSA colonization surveillance program. It is not intended to diagnose MRSA infection nor to guide or monitor treatment for MRSA infections.   Culture, blood (Routine X 2) w Reflex to ID Panel     Status: None (Preliminary result)   Collection Time: 04/03/15  8:10 PM  Result Value Ref Range Status   Specimen Description BLOOD LEFT ARM  Final   Special Requests BOTTLES DRAWN AEROBIC AND ANAEROBIC 5CC  Final   Culture NO GROWTH < 24 HOURS  Final   Report Status PENDING  Incomplete  Culture, blood (Routine X 2) w Reflex to ID Panel     Status: None (Preliminary result)   Collection Time: 04/03/15  8:21 PM  Result Value Ref Range Status   Specimen Description BLOOD RIGHT ARM  Final   Special Requests BOTTLES DRAWN AEROBIC AND ANAEROBIC 5CC  Final   Culture NO GROWTH < 24 HOURS  Final   Report Status PENDING  Incomplete     Scheduled Meds: . sodium chloride   Intravenous Once  . amLODipine  5 mg Oral BID  . docusate sodium  100 mg Oral BID  . feeding supplement (GLUCERNA SHAKE)  237 mL Oral TID BM  . ferrous sulfate  325 mg Oral BID WC  . insulin aspart  0-9 Units Subcutaneous TID WC  . insulin aspart  3 Units Subcutaneous TID WC  . metoprolol tartrate  50 mg Oral BID  . midazolam  2 mg Intravenous Once  . pencillin G potassium IV  4 Million Units Intravenous Q4H  . polyethylene glycol  17 g Oral Daily  . senna  2 tablet Oral Daily  . sodium chloride  10-40 mL Intracatheter Q12H   Continuous Infusions: . lactated ringers 10 mL/hr at 03/30/15 1543     Reyne Dumas, M.D. [facility] Pager [phone]  If 7PM-7AM, please contact night-coverage [email or web address] Password [email or web address] 04/05/2015, 12:29 PM   LOS: 9 days

## 2015-04-06 ENCOUNTER — Inpatient Hospital Stay (HOSPITAL_COMMUNITY): Payer: Managed Care, Other (non HMO)

## 2015-04-06 DIAGNOSIS — Z794 Long term (current) use of insulin: Secondary | ICD-10-CM

## 2015-04-06 DIAGNOSIS — N170 Acute kidney failure with tubular necrosis: Secondary | ICD-10-CM

## 2015-04-06 DIAGNOSIS — E0810 Diabetes mellitus due to underlying condition with ketoacidosis without coma: Secondary | ICD-10-CM

## 2015-04-06 LAB — BASIC METABOLIC PANEL
Anion gap: 11 (ref 5–15)
BUN: 22 mg/dL — AB (ref 6–20)
CHLORIDE: 94 mmol/L — AB (ref 101–111)
CO2: 22 mmol/L (ref 22–32)
CREATININE: 1.3 mg/dL — AB (ref 0.44–1.00)
Calcium: 8.1 mg/dL — ABNORMAL LOW (ref 8.9–10.3)
GFR calc Af Amer: 54 mL/min — ABNORMAL LOW (ref >60)
GFR calc non Af Amer: 47 mL/min — ABNORMAL LOW (ref >60)
GLUCOSE: 233 mg/dL — AB (ref 65–99)
POTASSIUM: 4.7 mmol/L (ref 3.5–5.1)
Sodium: 127 mmol/L — ABNORMAL LOW (ref 135–145)

## 2015-04-06 LAB — CBC
HCT: 23.7 % — ABNORMAL LOW (ref 36.0–46.0)
HEMOGLOBIN: 8.1 g/dL — AB (ref 12.0–15.0)
MCH: 28.2 pg (ref 26.0–34.0)
MCHC: 34.2 g/dL (ref 30.0–36.0)
MCV: 82.6 fL (ref 78.0–100.0)
PLATELETS: 329 10*3/uL (ref 150–400)
RBC: 2.87 MIL/uL — AB (ref 3.87–5.11)
RDW: 14.9 % (ref 11.5–15.5)
WBC: 12.4 10*3/uL — ABNORMAL HIGH (ref 4.0–10.5)

## 2015-04-06 LAB — GLUCOSE, CAPILLARY
GLUCOSE-CAPILLARY: 124 mg/dL — AB (ref 65–99)
GLUCOSE-CAPILLARY: 169 mg/dL — AB (ref 65–99)
Glucose-Capillary: 319 mg/dL — ABNORMAL HIGH (ref 65–99)
Glucose-Capillary: 202 mg/dL — ABNORMAL HIGH (ref 65–99)

## 2015-04-06 LAB — HCG, QUANTITATIVE, PREGNANCY: hCG, Beta Chain, Quant, S: <1 m[IU]/mL (ref <5)

## 2015-04-06 NOTE — Progress Notes (Signed)
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    OT comments  Pt appeared less painful today with ROM. Concentration on pendulums - pt tolerated well (better than AAROM in supine). Increased pain when activating rotator cuff muscles. Using countertop to lean on with good technique. Recommended pt use sling when ambulating to support arm. Retrograde massage R hand. Again educated pt on importance on ice and elevation of RUE as pt had hand positioned in dependent position on entry to room. Nsg educated on exercises and positioning. Encourage pt to ambulate with staff and perform pendulum and RUE A/AAROM throughout the day as tolerated. Continue to recommend follow up with [facility]OT after D/C. Continue to follow acutely.   Follow Up Recommendations  Home health OT;Supervision/Assistance - 24 hour    Equipment Recommendations  Tub/shower seat    Recommendations for Other Services      Precautions / Restrictions Precautions Precautions: Other (comment) (painful R shoulder) Required Braces or Orthoses: Sling (comfort) Restrictions RUE Weight Bearing: Weight bearing as tolerated       Mobility Bed Mobility                  Transfers Overall transfer level: Modified independent               General transfer comment: Pt has been ambulating with NT and family    Balance                                   ADL                                         General ADL Comments: Educated pt on technique to donn sling by using counter to support arm. Pt verbalized understanding.                                       Cognition   Behavior During Therapy: WFL for  tasks assessed/performed Overall Cognitive Status: Within Functional Limits for tasks assessed                       Extremity/Trunk Assessment               Exercises Shoulder Exercises Pendulum Exercise: AAROM;Right;20 reps;Standing Elbow Flexion: AAROM;Right;15 reps;Seated Elbow Extension: AAROM;Right;15 reps;Seated Wrist Flexion: AROM;Right;10 reps;Seated Wrist Extension: AROM;Right;10 reps;Seated Digit Composite Flexion: AROM;Right;20 reps;Squeeze ball Composite Extension: AROM;Right;15 reps Other Exercises Other Exercises: retrograde massage R hand with good response Other Exercises: educted pt on need to keep RUE elevated on 3 pillows with hand above heart Other Exercises: Ice to R shoulder Other Exercises: use of ice to reduce edema and pain   Shoulder Instructions       General Comments      Pertinent Vitals/ Pain       Pain Assessment: 0-10 Pain Score: 8  Pain Location: RUE Pain Descriptors / Indicators: Aching Pain Intervention(s): Limited activity within patient's tolerance;Repositioned (encouraged ice after pt walks)  Home Living                                          Prior Functioning/Environment              Frequency Min 3X/week     Progress Toward Goals  OT Goals(current goals can now be found in the care plan section)  Progress towards OT goals: Progressing toward goals  Acute Rehab OT Goals Patient Stated Goal: to get better OT Goal Formulation: With patient Time For Goal Achievement: 04/19/15 Potential to Achieve Goals: Good ADL Goals Pt/caregiver will Perform Home Exercise Program: Increased ROM;Right Upper extremity;With written HEP provided Additional ADL Goal #1: Pt/family will verbalize understadning of copensatory techniques for ADL Additional ADL Goal #3: Pt/family will verbalize understanding of use of elevation/ice for RUE t reduce edema Additional ADL Goal #4: Pt/family will demosntrate  understanding of retrograde massage R hand to reduce dependent edema.  Plan Discharge plan remains appropriate    Co-evaluation                 End of Session     Activity Tolerance Patient tolerated treatment well   Patient Left in bed;with call bell/phone within reach;with nursing/sitter in room   Nurse Communication Mobility status;Other (comment) (pendulum exercises)        Time: [id number] OT Time Calculation (min): 23 min  Charges: OT General Charges $OT Visit: 1 Procedure OT Treatments $Therapeutic Activity: 23-37 mins  Harjas Biggins,HILLARY 04/06/2015, 1:46 PM   [facility], OTR/L  [phone] 04/06/2015

## 2015-04-06 NOTE — Progress Notes (Addendum)
PROGRESS NOTE  [name] [id number] DOB: 1965-10-19 DOA: 03/27/2015 PCP: [name], MD   [name] is a 50 y.o. female with hx of DM (no meds) , HTN and uterine fibroids who had sudden onset of rigors 2 days ago followed by epigastric pain, nausea, vomiting and mild diarrhea. She then began to have severe left-sided posterior neck pain leading her to come to the hospital yesterday. She had low-grade fever of 100.5 and acute renal insufficiency. Her chest x-ray was negative. Abdominal ultrasound was normal with the exception of questionable cholelithiasis. Her urinalysis did show some pyuria but she has not been having any urinary tract symptoms. She was started on empiric daptomycin and piperacillin tazobactam (vancomycin was avoided because of concerns about potential nephrotoxicity). Both admission blood cultures are already growing gram-positive cocci in pairs and chains. Her abdominal pain, nausea, vomiting and diarrhea have completely resolved. She continues to have left-sided neck pain but it is slightly better. Over the past 24 hours she has developed joint pains. It is most severe in her right shoulder, right elbow and right wrist but she also has some milder pain in her left shoulder and right knee. Patient treated for strep pyogenes bacteremia with right shoulder septic arthritis, plan is to continue with IV antibiotics through 2/13. Right UE still very painful. Dr Percell Miller aware and saw the patient 1/24  Assessment/Plan: Sepsis with bacteremia -present at the time of admission Disseminated GAS bacteremia - on penicillin and WBC improved, seems to be improving overall.  Continue penicillin (if available in a pump) for 4 weeks total from surgery 1/17 through February 13th.  Weekly cmp, cbc to [facility] will arrange follow up in [facility] in about 2 weeks Discussed on multiple occasions with Dr. Leane Call. [name], [name]  about continued low-grade  fevers, he does not recommend any changes in treatment or any further testing at this time. Concern for persistent infection in the right shoulder, although synovial fluid culture from 1/17, blood culture from 1/17 remain negative   GAS Bacteremia with Septic Arthritis of R shoulder -Was on IV Daptomycin, now IV Penicillin since 1/16 per ID, continue IV penicillin as above -had some sore throat prior to admission and son had strep throat 2weeks ago. -ID and Cardiology were consulted for bacteremia Bacteremia Identified as S Pyogenes, cardiology was consulted for possible TEE, however this was canceled per ID recommendations , Can reschedule if blood cultures remain positive , blood cultures from 1/17  no growth so far -per ID unlikely to have endocarditis, hence patient did not have TEE,  Due to Severe pain and swelling of R shoudler got MRi  and patient found to have septic arthritis  -Arthrocentesis by IR--gram stain GPC in pairs and chains in R shoulder synovial fluid, c/w seeding and septic arthritis from bacteremia (culture neg) -Ortho consulted, d/w Dr.Tim Percell Miller, patient is status post arthroscopy, irrigation and debridement of right shoulder on 1/17 -04/01/2015--infectious disease ordered PICC line--husband requested IR to place a tunnel catheter in the IJ due to his concerns of vein sclerosis presumptive need of dialysis although the patient has normal renal function at this time  Patient now has right IJ tunneled cuffed hickman placement interventional radiology Due to right upper extremity swelling patient had a repeat MRI that shows the  effusion which could be septic. Considerable edema in the surrounding rotator cuff and deltoid musculature, concern for myositis No discrete abscess,Dr Percell Miller saw patient today and  would like to manage conservatively   Hyponatremia This has remained stable throughout this hospitalization [id number] Asymptomatic  Right upper extremity  swelling Patient noted to have arterial and venous Doppler to rule out arterial thrombus, DVT Also notified orthopedic surgery to evaluate the right shoulder This could be the cause of patient's fever yesterday superficial thrombosis noted in the basilic vein of the right upper extremity., Started on warm compresses, no indication for anticoagulation   Anemia of chronic disease Patient has received a total of 2 units of packed red blood cells during this admission Follow CBC, hemoglobin has been stable for 3 days, 8.1 on 1/24 Anemia panel shows iron deficiency and the patient has been started on iron supplementation Patient is to follow-up at [facility] gastroenterology for endoscopic evaluation once her bacteremia resolves   Right lower lobe infiltrate/HCAP? -Patient is small to moderate-sized pleural effusion, possible right basilar air space opacity, atelectasis versus pneumonia,   white count improving 21.2 > 15 Patient currently on  penicillin G Based on the chest x-ray Dr.[name] , has not recommended any changes in her antibiotic treatment PCCM also consulted during this admission, no evidence of pleural effusion on ultrasound. According to the recommendation Continue abx for bacteremia as per ID. No need for any additional coverage Family requested transfer to [place], discussed with hospitalist (Dr Beckey Downing) there who has not accepted patient as they are on diversion due to unavailability of beds , husband aware that patient has not been accepted due to no medical indication for transfer  ABG 7.47, 29.7, 102   AKI  -Resolved 5.26>1.4> 1.3 -due to sepsis, poor Po intake, NSAIDs, ARB -Baseline creatinine thought to be within normal limits,   -Nephrology consulted. Ultrasounds of abdomen and pelvis showed signs of medico renal disease and no signs of obstruction. -creatinine normalized, IV fluids have been discontinued, hold lasix as she appears euvolemic    R shoulder Septic  arthritis -see above -Secondary to disseminated Streptococcus biology needs bacteremia work on ROM of R shoulder and gait training Dr. Edmonia Lynch to see the patient again today and evaluate  DM2--controlled -metformin on hold, SSI, Hbaic is 7  Nonsustained ventricular tachycardia -had an episode of 30 beats 1/15 pm -continue metoprolol, replaced K and Mag -2d ECHO with normal EF and wall motion;  no structural abnormalities -Cards signed off 1/20  Polyarthritis -See discussion on R shoulder septic arthritis as above, has inflammation of R elbow, wrist, L shoulder, elbow, wrist, much less pronounced than R shoulder -unable to use NSAIDs and steroids in setting of infection and acute kidney injury  Elevated Bili -Secondary to sepsis and cholestasis, ALP, AST/ALT WNL -improving  HTN -stable, holding ARB due to AKI\ -Elevation partly due to pain -Metoprolol tartrate  25 mg every 6 hours, amlodipine added  Elevated B-HCG,  -she is not pregnant, LMP>1year ago, menopausal now, Pelvic US negative for gestational sac -suspect this could be from her pituitary gland, needs to be followed, GYN As outpatient  Anemia of chronic disease,  -hemoglobin 7.9, >7.3, FOBT positive  Received a total of 2 units of packed red blood cells Anemia panel consistent with anemia of chronic disease, relative iron deficiency Patient's iron supplementation Hemoglobin improved to 8.9>8.1 after 2 units,  no active bleeding Patient would need follow-up at [facility] gastroenterology  In the outpt setting      Family Communication:   Husband updated at beside Disposition Plan:   Not stable for discharge given right arm swelling  Procedures/Studies: Mr Humerus Right Wo Contrast  04/06/2015  CLINICAL DATA:  Pain and swelling of the right upper extremity EXAM: MRI OF THE RIGHT HUMERUS WITHOUT CONTRAST TECHNIQUE: Multiplanar, multisequence MR imaging was performed. No intravenous contrast was  administered. COMPARISON:  03/29/2015 FINDINGS: Again observed is a large glenohumeral joint effusion with edema tracking along adjacent muscle plans including the deltoid and rotator cuff musculature. The deltoid edema is most confluent anteriorly and there is a small amount of edema tracking along the superficial margin of the pectoralis major muscle. Edema tracks along the superficial fascia margin of the right rib cage. Fluid tracks along the superficial fascia margins of the biceps and triceps. There is muscular edema of the lateral head of the triceps and to a lesser extent medially in the brachialis. No significant elbow joint effusion although there is considerable subcutaneous edema along the elbow. I do not observe a drainable abscess. Edema tracking along the superficial fascia margins extends into the forearm. IMPRESSION: 1. There continues to be a right glenohumeral joint effusion which could be septic. Considerable edema in the surrounding rotator cuff and deltoid musculature. 2. Infiltrative edema also tracks along the margins (primarily the superficial fascia margins) of the pectoralis, biceps, and triceps musculature. There is no elbow joint effusion. Subcutaneous edema tracks into the forearm. I do not see a discrete drainable abscess. Potential myositis involving the rotator cuff musculature, anterior deltoid, and lateral head of the triceps. Electronically Signed   By: Van Clines M.D.   On: 04/06/2015 07:14   US Soft Tissue Head/neck  03/27/2015  CLINICAL DATA:  Left-sided neck pain EXAM: ULTRASOUND LEFT NECK TECHNIQUE: Longitudinal and transverse images of the left neck region in the area of pain performed COMPARISON:  None. FINDINGS: Sonographic images of the left neck show no mass or inflammatory focus. No abnormal fluid collection. In particular, no abscess. Several tiny lymph nodes are noted in this area; by size criteria, there is no adenopathy in this area. Vascular structures  of this area are widely patent. IMPRESSION: Scattered subcentimeter lymph nodes in the left neck, not meeting size criteria for pathologic significance. No mass or inflammatory focus appreciable. No abscess in particular. Electronically Signed   By: Lowella Grip III M.D.   On: 03/27/2015 17:19   [place] Chest  03/31/2015  CLINICAL DATA:  50 year old female with chest x-ray demonstrating right-sided small pleural effusion. EXAM: CHEST ULTRASOUND COMPARISON:  None. FINDINGS: Ultrasound survey of the right chest demonstrating small complex pleural effusion, not amenable to ultrasound-guided drainage. IMPRESSION: Small, complex appearing right pleural effusion not amenable for ultrasound-guided thoracentesis. Signed, Dulcy Fanny. Earleen Newport, DO [facility] Radiology Electronically Signed   By: [name] D.O.   On: 03/31/2015 11:05   US Abdomen Complete  03/27/2015  CLINICAL DATA:  Septic, generalized upper and lower abdominal pain, positive pregnancy test EXAM: ABDOMEN ULTRASOUND COMPLETE COMPARISON:  None FINDINGS: Gallbladder: Dependent echogenic material with minimal shadowing likely calculi in gallbladder. No gallbladder wall thickening, pericholecystic fluid, or sonographic Murphy sign. Common bile duct: Diameter: Normal caliber 4 mm diameter Liver: Normal appearance IVC: Normal appearance Pancreas: Distal tail obscured by bowel gas. Visualized portion normal appearance. Spleen: Normal appearance, 8.5 cm length Right Kidney: Length: 12.5 cm. Increased cortical echogenicity. Normal cortical thickness. No mass or hydronephrosis. Left Kidney: Length: 12.7 cm. Increased cortical echogenicity. Normal cortical thickness. No mass or hydronephrosis. Abdominal aorta: Normal caliber Other findings: No free fluid IMPRESSION: Probable medical renal disease changes. Probable cholelithiasis  without evidence acute cholecystitis. Remainder of exam unremarkable. Electronically  Signed   By: Lavonia Dana M.D.   On: 03/27/2015 17:29   US Ob Comp Less 14 Wks  03/27/2015  CLINICAL DATA:  Low beta HCG of 27. Abdominal pain with sepsis. Elevated white blood cell count. EXAM: OBSTETRIC <14 WK [place] AND TRANSVAGINAL OB US TECHNIQUE: Both transabdominal and transvaginal ultrasound examinations were performed for complete evaluation of the gestation as well as the maternal uterus, adnexal regions, and pelvic cul-de-sac. Transvaginal technique was performed to assess early pregnancy. COMPARISON:  Ultrasound 02/13/2012 FINDINGS: Intrauterine gestational sac: None Yolk sac:  Not present Embryo:  Not present Maternal uterus/adnexae: The uterus is lobular and heterogeneous echotexture consistent multiple leiomyoma. This appears similar to the ultrasound of 2013. The endometrium is difficult to identified on the background of the multiple leiomyoma but measures 8 mm. Additionally, the ovaries are not identified. There is reported history of a LEFT oophorectomy in 2008. No free fluid. IMPRESSION: 1. Leiomyomatous uterus similar to 2013. 2. No decidual reaction or gestational sac identified. 3. No intrauterine gestational sac, yolk sac, or fetal pole identified. Differential considerations include intrauterine pregnancy too early to be sonographically visualized, missed abortion, or ectopic pregnancy. Followup ultrasound is recommended in 10-14 days for further evaluation. Electronically Signed   By: Suzy Bouchard M.D.   On: 03/27/2015 18:07   US Ob Transvaginal  03/27/2015  CLINICAL DATA:  Low beta HCG of 27. Abdominal pain with sepsis. Elevated white blood cell count. EXAM: OBSTETRIC <14 WK [place] AND TRANSVAGINAL OB US TECHNIQUE: Both transabdominal and transvaginal ultrasound examinations were performed for complete evaluation of the gestation as well as the maternal uterus, adnexal regions, and pelvic cul-de-sac. Transvaginal technique was performed to assess early pregnancy. COMPARISON:  Ultrasound  02/13/2012 FINDINGS: Intrauterine gestational sac: None Yolk sac:  Not present Embryo:  Not present Maternal uterus/adnexae: The uterus is lobular and heterogeneous echotexture consistent multiple leiomyoma. This appears similar to the ultrasound of 2013. The endometrium is difficult to identified on the background of the multiple leiomyoma but measures 8 mm. Additionally, the ovaries are not identified. There is reported history of a LEFT oophorectomy in 2008. No free fluid. IMPRESSION: 1. Leiomyomatous uterus similar to 2013. 2. No decidual reaction or gestational sac identified. 3. No intrauterine gestational sac, yolk sac, or fetal pole identified. Differential considerations include intrauterine pregnancy too early to be sonographically visualized, missed abortion, or ectopic pregnancy. Followup ultrasound is recommended in 10-14 days for further evaluation. Electronically Signed   By: Suzy Bouchard M.D.   On: 03/27/2015 18:07   Mr Shoulder Right Wo Contrast  03/30/2015  CLINICAL DATA:  Joint pain most severe in the right shoulder. Sepsis. EXAM: MRI OF THE RIGHT SHOULDER WITHOUT CONTRAST TECHNIQUE: Multiplanar, multisequence MR imaging of the shoulder was performed. No intravenous contrast was administered. COMPARISON:  03/27/2015 FINDINGS: Rotator cuff: Prominent partial thickness articular surface tearing of the supraspinatus, with the articular surface portion retracted 1.7 cm on image 16 series 5, compatible with tendon delamination. Moderate tendinopathy of the remaining supraspinatus tendon. Similarly there is partial thickness articular surface tearing of the infraspinatus tendon with moderate infraspinatus tendinopathy. Moderate subscapularis tendinopathy is present along with potential partial thickness articular surface tearing Muscles: Abnormal edema tracks primarily along the marginal portions of the supraspinatus, subscapularis, teres minor, and teres major muscles. There is also low-level  edema along the superficial fascia margin of the lateral deltoid. Biceps long head: Partial tearing or advanced tendinopathy of the  intra-articular segment. Acromioclavicular Joint: Mild degenerative AC joint arthropathy. Subacromial morphology is type 2 (curved). Small but abnormal amount of fluid in the subacromial subdeltoid bursa. Glenohumeral Joint: Prominent glenohumeral joint effusion. Moderate degenerative chondral thinning along the glenohumeral joint. Labrum: Linear irregularity in the superior labrum with a blunted appearance, compatible with SLAP tear extending into the biceps anchor. Bones: Unusual flaring of the proximal humeral metadiaphysis, query prior fracture. Small right axillary lymph nodes are observed. IMPRESSION: 1. Large shoulder joint effusion with abnormal edema tracking within and along the margins of the regional musculature. In the setting of sepsis and new onset shoulder pain, there is a high degree of concern for septic glenohumeral joint, and arthrocentesis is likely indicated. 2. Partial thickness articular surface tearing of the supraspinatus and infraspinatus with tendon delamination. There is potentially mild partial thickness articular surface tearing of the subscapularis. Considerable tendinopathy of these tendons as well. 3. Advanced tendinopathy or partial tearing of the long head of the biceps. 4. Increased signal in the superior labrum compatible with SLAP tear extending into the biceps anchor. 5. Subacromial subdeltoid bursitis. 6. Somewhat unusual flaring of the proximal humeral metadiaphysis, query prior fracture. 7. Moderate degenerative glenohumeral arthropathy. These results will be called to the ordering clinician or representative by the Radiologist Assistant, and communication documented in the PACS or zVision Dashboard. Electronically Signed   By: Van Clines M.D.   On: 03/30/2015 07:28   Ir Fluoro Guide Cv Line Right  04/01/2015  CLINICAL DATA:   50 year old female with a history of septic arthritis. She has been referred for tunneled catheter placement for antibiotics. EXAM: IR RIGHT FLOURO GUIDE CV LINE; IR ULTRASOUND GUIDANCE VASC ACCESS RIGHT Date: 04/01/2015 ANESTHESIA/SEDATION: None Total sedation time: 0 minutes FLUOROSCOPY TIME:  30 second TECHNIQUE: The procedure, risks, benefits, and alternatives were explained to the patient. Questions regarding the procedure were encouraged and answered. The patient understands and consents to the procedure. The right neck and chest was prepped with chlorhexidine, and draped in the usual sterile fashion using maximum barrier technique (cap and mask, sterile gown, sterile gloves, large sterile sheet, hand hygiene and cutaneous antiseptic). Local anesthesia was attained by infiltration with 1% lidocaine without epinephrine. Ultrasound demonstrated patency of the right internal jugular vein, and this was documented with an image. Under real-time ultrasound guidance, this vein was accessed with a 21 gauge micropuncture needle and image documentation was performed. A small dermatotomy was made at the access site with an 11 scalpel. A 0.018" wire was advanced into the SVC and the access needle exchanged for a 72F micropuncture vascular sheath. The 0.018" wire was then removed and a 0.035" wire advanced into the IVC. Peel-away sheath was placed over the wire, and upon removal of the wire appropriate internal length was obtained/measured. Skin and subcutaneous tissues on the right chest wall just inferior to the clavicle were infiltrated with 1% lidocaine for local anesthesia. Small stab incision was made with 11 blade scalpel. The catheter was back tunneled to the incision at the neck, with the catheter pulled through the subcutaneous tunnel. Catheter was then ligated at the appropriate length at the neck and passed through the peel-away sheath. Peel-away sheath was removed. Final image was stored. Catheter was then  sutured onto the right chest wall with retention sutures. The dermatotomy at the venous access site was also closed with Dermabond. Patient tolerated the procedure well and remained hemodynamically stable throughout. No complications were encountered and no significant blood loss was encountered. COMPLICATIONS: None  IMPRESSION: Status post placement of right IJ tunneled catheter measuring 24 centimeter. Catheter ready for use. Signed, Dulcy Fanny. Earleen Newport, DO [facility] Radiology Electronically Signed   By: [name] D.O.   On: 04/01/2015 14:45   Ir US Guide Vasc Access Right  04/01/2015  CLINICAL DATA:  50 year old female with a history of septic arthritis. She has been referred for tunneled catheter placement for antibiotics. EXAM: IR RIGHT FLOURO GUIDE CV LINE; IR ULTRASOUND GUIDANCE VASC ACCESS RIGHT Date: 04/01/2015 ANESTHESIA/SEDATION: None Total sedation time: 0 minutes FLUOROSCOPY TIME:  30 second TECHNIQUE: The procedure, risks, benefits, and alternatives were explained to the patient. Questions regarding the procedure were encouraged and answered. The patient understands and consents to the procedure. The right neck and chest was prepped with chlorhexidine, and draped in the usual sterile fashion using maximum barrier technique (cap and mask, sterile gown, sterile gloves, large sterile sheet, hand hygiene and cutaneous antiseptic). Local anesthesia was attained by infiltration with 1% lidocaine without epinephrine. Ultrasound demonstrated patency of the right internal jugular vein, and this was documented with an image. Under real-time ultrasound guidance, this vein was accessed with a 21 gauge micropuncture needle and image documentation was performed. A small dermatotomy was made at the access site with an 11 scalpel. A 0.018" wire was advanced into the SVC and the access needle exchanged for a 65F micropuncture vascular sheath. The 0.018" wire was then  removed and a 0.035" wire advanced into the IVC. Peel-away sheath was placed over the wire, and upon removal of the wire appropriate internal length was obtained/measured. Skin and subcutaneous tissues on the right chest wall just inferior to the clavicle were infiltrated with 1% lidocaine for local anesthesia. Small stab incision was made with 11 blade scalpel. The catheter was back tunneled to the incision at the neck, with the catheter pulled through the subcutaneous tunnel. Catheter was then ligated at the appropriate length at the neck and passed through the peel-away sheath. Peel-away sheath was removed. Final image was stored. Catheter was then sutured onto the right chest wall with retention sutures. The dermatotomy at the venous access site was also closed with Dermabond. Patient tolerated the procedure well and remained hemodynamically stable throughout. No complications were encountered and no significant blood loss was encountered. COMPLICATIONS: None IMPRESSION: Status post placement of right IJ tunneled catheter measuring 24 centimeter. Catheter ready for use. Signed, Dulcy Fanny. Earleen Newport, DO [facility] Radiology Electronically Signed   By: [name] D.O.   On: 04/01/2015 14:45   Dg Chest Port 1 View  03/31/2015  CLINICAL DATA:  Acute onset of shortness of breath. Initial encounter. EXAM: PORTABLE CHEST 1 VIEW COMPARISON:  Chest radiograph from 03/27/2015 FINDINGS: A small to moderate right-sided pleural effusion is noted. Right basilar airspace opacity may reflect atelectasis or pneumonia. Asymmetric interstitial edema could have a similar appearance. Underlying vascular congestion is seen. No pneumothorax is identified. The cardiomediastinal silhouette is borderline enlarged. No acute osseous abnormalities are seen. IMPRESSION: 1. Small to moderate right-sided pleural effusion noted. Right basilar airspace opacity may reflect atelectasis or  pneumonia. Asymmetric interstitial edema could have a similar appearance. 2. Underlying vascular congestion and borderline cardiomegaly. Electronically Signed   By: Garald Balding M.D.   On: 03/31/2015 01:49   Dg Chest Port 1 View  03/27/2015  CLINICAL DATA:  Fever for 3 days EXAM: PORTABLE CHEST 1 VIEW COMPARISON:  None. FINDINGS: Lungs are clear. Heart is upper normal  in size with pulmonary vascularity within normal limits. No adenopathy. No bone lesions. IMPRESSION: No edema or consolidation. Electronically Signed   By: Lowella Grip III M.D.   On: 03/27/2015 15:43   Dg Fluoro Guided Needle Plc Aspiration/injection Loc  03/30/2015  CLINICAL DATA:  Prior MRI suspicious for septic glenohumeral joint. Sepsis. EXAM: FLUORO GUIDED NEEDLE PLACEMENT FLUOROSCOPY TIME:  If the device does not provide the exposure index: Fluoroscopy Time (in minutes and seconds):  1 minutes and 42 seconds Number of Acquired Images:  None COMPARISON:  MRI of 03/29/2015. FINDINGS: Informed written and verbal consent were obtained. Risks and benefits of the procedure were discussed. A "Time out" was performed. The superior medial portion of the right humeral head was localized under fluoroscopic guidance. Skin was prepped and draped in standard sterile fashion. Skin was numbed with 1% lidocaine. A 20 gauge needle was inserted into the joint. No fluid could be aspirated. Intra-articular location was confirmed with approximately 6 cc of Omnipaque 180. Subsequent, approximately 5 cc of saline were injected. Only minimal fluid could be returned. IMPRESSION: Uncomplicated aspiration of the right shoulder, as detailed above. Minimal fluid was obtained, and sent to laboratory as requested. Electronically Signed   By: Abigail Miyamoto M.D.   On: 03/30/2015 12:11        Subjective:  Patient continues to have low-grade fever, Dr. Edmonia Lynch to evaluate the patient's right shoulder today     Objective: Filed Vitals:   04/05/15  1500 04/05/15 2252 04/06/15 0620 04/06/15 0830  BP: 163/84 162/88 174/89 154/77  Pulse: 108 110 116 112  Temp: 100.5 F (38.1 C) 99.5 F (37.5 C) 98.8 F (37.1 C) 99.6 F (37.6 C)  TempSrc: Oral Oral Oral Oral  Resp: 20 18 18 20   Height:      Weight:      SpO2: 98% 98% 99% 100%    Intake/Output Summary (Last 24 hours) at 04/06/15 1454 Last data filed at 04/06/15 0950  Gross per 24 hour  Intake   2980 ml  Output      1 ml  Net   2979 ml   Weight change:  Exam:   General:  Pt is alert, follows commands appropriately, not in acute distress  HEENT: No icterus, No thrush, No neck mass, [place]/AT  Cardiovascular: RRR, S1/S2, no rubs, no gallops  Respiratory: Bibasilar crackles, L>R. No wheeze.  Abdomen: Soft/+BS, non tender, non distended, no guarding  Extremities: No edema, No lymphangitis, No petechiae, No rashes, no synovitis  Data Reviewed: Basic Metabolic Panel:  Recent Labs Lab 04/02/15 0345 04/03/15 0949 04/04/15 0508 04/05/15 0521 04/06/15 0428  NA 130* 127* 128* 127* 127*  K 3.6 3.9 3.9 4.4 4.7  CL 98* 96* 100* 97* 94*  CO2 25 20* 23 24 22   GLUCOSE 151* 248* 231* 223* 233*  BUN 17 21* 23* 26* 22*  CREATININE 1.11* 1.06* 1.33* 1.46* 1.30*  CALCIUM 8.0* 7.9* 7.8* 7.9* 8.1*   Liver Function Tests:  Recent Labs Lab 03/31/15 0105 04/01/15 0237 04/03/15 0949 04/04/15 0508 04/05/15 0521  AST 36 25 25 21 29   ALT 22 12* 13* 12* 16  ALKPHOS 114 103 80 74 72  BILITOT 1.2 1.3* 1.1 0.8 0.8  PROT 6.0* 5.9* 6.3* 6.3* 6.7  ALBUMIN 1.7* 1.6* 1.4* 1.5* 1.5*   No results for input(s): LIPASE, AMYLASE in the last 168 hours.  Recent Labs Lab 03/31/15 0105  AMMONIA 49*   CBC:  Recent Labs Lab 04/02/15 0345 04/03/15  [id number] 04/04/15 0508 04/05/15 0521 04/06/15 0428  WBC 16.0* 15.0* 12.1* 12.1* 12.4*  NEUTROABS 12.8*  --   --   --   --   HGB 7.3* 8.9* 8.5* 8.5* 8.1*  HCT 22.0* 25.4* 24.1* 24.2* 23.7*  MCV 82.4 80.9 81.4 82.3 82.6  PLT 174 169 196 250  329   Cardiac Enzymes: No results for input(s): CKTOTAL, CKMB, CKMBINDEX, TROPONINI in the last 168 hours. BNP: Invalid input(s): POCBNP CBG:  Recent Labs Lab 04/05/15 1201 04/05/15 1723 04/05/15 2250 04/06/15 0801 04/06/15 1209  GLUCAP 222* 156* 149* 319* 202*    Recent Results (from the past 240 hour(s))  Culture, blood (Routine X 2) w Reflex to ID Panel     Status: None   Collection Time: 03/27/15  3:00 PM  Result Value Ref Range Status   Specimen Description BLOOD RIGHT HAND  Final   Special Requests BOTTLES DRAWN AEROBIC AND ANAEROBIC 5CC  Final   Culture  Setup Time   Final    GRAM POSITIVE COCCI IN PAIRS IN CHAINS IN BOTH AEROBIC AND ANAEROBIC BOTTLES CRITICAL RESULT CALLED TO, READ BACK BY AND VERIFIED WITH: A. MINTZ,RN AT 0848 ON [id number] BY Rhea Bleacher    Culture GROUP A STREP (S.PYOGENES) ISOLATED  Final   Report Status 03/30/2015 FINAL  Final   Organism ID, Bacteria GROUP A STREP (S.PYOGENES) ISOLATED  Final      Susceptibility   Group a strep (s.pyogenes) isolated - MIC*    CLINDAMYCIN >=1 RESISTANT Resistant     AMPICILLIN <=0.25 SENSITIVE Sensitive     ERYTHROMYCIN >=8 RESISTANT Resistant     VANCOMYCIN <=0.12 SENSITIVE Sensitive     CEFTRIAXONE <=0.12 SENSITIVE Sensitive     LEVOFLOXACIN <=0.25 SENSITIVE Sensitive     * GROUP A STREP (S.PYOGENES) ISOLATED  Culture, blood (routine x 2)     Status: None   Collection Time: 03/28/15  3:00 PM  Result Value Ref Range Status   Specimen Description BLOOD RIGHT HAND  Final   Special Requests BOTTLES DRAWN AEROBIC AND ANAEROBIC 5CC  Final   Culture NO GROWTH 5 DAYS  Final   Report Status 04/02/2015 FINAL  Final  Culture, blood (routine x 2)     Status: None   Collection Time: 03/28/15  3:05 PM  Result Value Ref Range Status   Specimen Description BLOOD RIGHT HAND  Final   Special Requests BOTTLES DRAWN AEROBIC ONLY 8.5CC  Final   Culture NO GROWTH 5 DAYS  Final   Report Status 04/02/2015 FINAL  Final  Gram  stain     Status: None   Collection Time: 03/30/15 10:52 AM  Result Value Ref Range Status   Specimen Description FLUID SYNOVIAL RIGHT SHOULDER  Final   Special Requests NONE  Final   Gram Stain   Final    MODERATE WBC PRESENT, PREDOMINANTLY PMN FEW GRAM POSITIVE COCCI IN PAIRS IN CHAINS    Report Status 03/30/2015 FINAL  Final  Fungus Culture with Smear     Status: None (Preliminary result)   Collection Time: 03/30/15 10:52 AM  Result Value Ref Range Status   Specimen Description FLUID SYNOVIAL RIGHT SHOULDER  Final   Special Requests NONE  Final   Fungal Smear   Final    NO YEAST OR FUNGAL ELEMENTS SEEN Performed at [facility]    Culture   Final    CULTURE IN PROGRESS FOR FOUR WEEKS Performed at [facility]    Report Status PENDING  Incomplete  Anaerobic culture     Status: None   Collection Time: 03/30/15  7:42 PM  Result Value Ref Range Status   Specimen Description SYNOVIAL RIGHT SHOULDER  Final   Special Requests PATIENT ON FOLLOWING PENICILLIN  Final   Gram Stain   Final    ABUNDANT WBC PRESENT, PREDOMINANTLY PMN NO ORGANISMS SEEN    Culture NO ANAEROBES ISOLATED  Final   Report Status 04/05/2015 FINAL  Final  Body fluid culture     Status: None   Collection Time: 03/30/15  7:43 PM  Result Value Ref Range Status   Specimen Description SYNOVIAL RIGHT SHOULDER  Final   Special Requests PATIENT ON FOLLOWING PENICILLIN  Final   Gram Stain   Final    ABUNDANT WBC PRESENT, PREDOMINANTLY PMN NO ORGANISMS SEEN    Culture NO GROWTH 3 DAYS  Final   Report Status 04/03/2015 FINAL  Final  MRSA PCR Screening     Status: None   Collection Time: 03/31/15  1:55 AM  Result Value Ref Range Status   MRSA by PCR NEGATIVE NEGATIVE Final    Comment:        The GeneXpert MRSA Assay (FDA approved for NASAL specimens only), is one component of a comprehensive MRSA colonization surveillance program. It is not intended to diagnose MRSA infection nor to guide  or monitor treatment for MRSA infections.   Culture, blood (Routine X 2) w Reflex to ID Panel     Status: None (Preliminary result)   Collection Time: 04/03/15  8:10 PM  Result Value Ref Range Status   Specimen Description BLOOD LEFT ARM  Final   Special Requests BOTTLES DRAWN AEROBIC AND ANAEROBIC 5CC  Final   Culture NO GROWTH 3 DAYS  Final   Report Status PENDING  Incomplete  Culture, blood (Routine X 2) w Reflex to ID Panel     Status: None (Preliminary result)   Collection Time: 04/03/15  8:21 PM  Result Value Ref Range Status   Specimen Description BLOOD RIGHT ARM  Final   Special Requests BOTTLES DRAWN AEROBIC AND ANAEROBIC 5CC  Final   Culture NO GROWTH 3 DAYS  Final   Report Status PENDING  Incomplete     Scheduled Meds: . sodium chloride   Intravenous Once  . amLODipine  5 mg Oral BID  . docusate sodium  100 mg Oral BID  . feeding supplement (GLUCERNA SHAKE)  237 mL Oral TID BM  . ferrous sulfate  325 mg Oral BID WC  . insulin aspart  0-9 Units Subcutaneous TID WC  . insulin aspart  3 Units Subcutaneous TID WC  . metoprolol tartrate  50 mg Oral BID  . midazolam  2 mg Intravenous Once  . pencillin G potassium IV  4 Million Units Intravenous Q4H  . polyethylene glycol  17 g Oral Daily  . senna  2 tablet Oral Daily  . sodium chloride  10-40 mL Intracatheter Q12H   Continuous Infusions: . lactated ringers 10 mL/hr at 03/30/15 1543     Reyne Dumas, M.D. [facility] Pager [phone]  If 7PM-7AM, please contact night-coverage [email or web address] Password [email or web address] 04/06/2015, 2:54 PM   LOS: 10 days

## 2015-04-06 NOTE — Progress Notes (Signed)
Inpatient Diabetes Program Recommendations  AACE/ADA: New Consensus Statement on Inpatient Glycemic Control (2015)  Target Ranges:  Prepandial:   less than 140 mg/dL      Peak postprandial:   less than 180 mg/dL (1-2 hours)      Critically ill patients:  140 - 180 mg/dL   Results for [name], [name] (MRN [id number]) as of 04/06/2015 09:47  Ref. Range 04/05/2015 08:35 04/05/2015 12:01 04/05/2015 17:23 04/05/2015 22:50 04/06/2015 08:01  Glucose-Capillary Latest Ref Range: 65-99 mg/dL 259 (H) 222 (H) 156 (H) 149 (H) 319 (H)   Review of Glycemic Control  Diabetes history: DM 2 Outpatient Diabetes medications: Metformin 500 mg BID Current orders for Inpatient glycemic control: Novolog Sensitive + HS scale  Inpatient Diabetes Program Recommendations:   Glucose 319 mg/dl this am. May want to consider low dose basal insulin while inpatient, Levemir 10-12 units Q 24hrs.  Thanks,  Tama Headings RN, MSN, [facility] Inpatient Diabetes Coordinator Team Pager [phone] (8a-5p)

## 2015-04-06 NOTE — Progress Notes (Signed)
PT Cancellation Note  Patient Details Name: [name] MRN: [id number] DOB: 22-Dec-1965   Cancelled Treatment:    Reason Eval/Treat Not Completed: Other (comment).  OT currently working with pt.  PT will check back later as time allows.  Thanks,   Barbarann Ehlers. [place], [place], DPT [phone]   04/06/2015, 12:09 PM

## 2015-04-06 NOTE — Progress Notes (Signed)
Physical Therapy Treatment Patient Details Name: [name] MRN: [id number] DOB: 04/20/1965 Today's Date: 04/06/2015    History of Present Illness 49/F with DM, HTN admitted with sepsis, AKI, found to have GAS bacteremia, R shoulder Septic arthritis, NSVT, ID and Cards following with pt with I& D right shoulder.     PT Comments    [place] continues to be limited by pain, but agreeable to ambulate in hall and completed AAROM exercises for Rt UE.  Encouraged pt and educated pt on the importance of maintaining Rt UE elevation at all times, pt verbalized understanding. Pt continues to demonstrate instability while ambulating and will likely benefit from use of cane.    Follow Up Recommendations  Home health PT;Supervision/Assistance - 24 hour     Equipment Recommendations  Cane    Recommendations for Other Services       Precautions / Restrictions Precautions Precautions: Other (comment) (painful R shoulder) Required Braces or Orthoses: Sling (comfort) Restrictions Weight Bearing Restrictions: Yes RUE Weight Bearing: Weight bearing as tolerated    Mobility  Bed Mobility               General bed mobility comments: Pt sitting in recliner chair upon PT arrival  Transfers Overall transfer level: Needs assistance Equipment used: None Transfers: Sit to/from Stand Sit to Stand: Supervision         General transfer comment: Pt strains to push up w/ Lt UE to standing and is slow to stand.    Ambulation/Gait Ambulation/Gait assistance: Supervision Ambulation Distance (Feet): 300 Feet Assistive device: None Gait Pattern/deviations: Step-through pattern;Decreased stride length;Drifts right/left   Gait velocity interpretation: Below normal speed for age/gender General Gait Details: Pt initially drifts Rt and requires cues to avoid door frame on Rt.     Stairs            Wheelchair Mobility    Modified Rankin (Stroke Patients Only)       Balance  Overall balance assessment: Needs assistance Sitting-balance support: Single extremity supported;Feet supported Sitting balance-Leahy Scale: Fair     Standing balance support: During functional activity;No upper extremity supported Standing balance-Leahy Scale: Fair                      Cognition Arousal/Alertness: Awake/alert Behavior During Therapy: WFL for tasks assessed/performed Overall Cognitive Status: Within Functional Limits for tasks assessed                      Exercises Shoulder Exercises Pendulum Exercise: AAROM;Right;10 reps;Standing;Limitations Pendulum Limitations: Limited by pain and requires cues to allow her body to move her shoulder, rather than activating her muscles Shoulder Flexion: AAROM;Right;Self ROM;5 reps;Standing Elbow Flexion: AAROM;Right;Standing;10 reps;Self ROM Elbow Extension: AAROM;Right;Self ROM;10 reps;Standing Wrist Flexion: AROM;Right;10 reps;Seated Wrist Extension: AROM;Right;10 reps;Seated Digit Composite Flexion: AROM;Right;20 reps;Squeeze ball Composite Extension: AROM;Right;15 reps Other Exercises Other Exercises: Massage ball x 3 mins, ecouraged pt to continue throughout the day Other Exercises: educted pt on need to keep RUE elevated on 3 pillows Other Exercises: Ice to R shoulder Other Exercises: use of ice to reduce edema and pain    General Comments        Pertinent Vitals/Pain Pain Assessment: 0-10 Pain Score: 9  Pain Location: Rt UE Pain Descriptors / Indicators: Aching;Constant;Grimacing;Guarding;Moaning Pain Intervention(s): Limited activity within patient's tolerance;Monitored during session;Repositioned    Home Living  Prior Function            PT Goals (current goals can now be found in the care plan section) Acute Rehab PT Goals Patient Stated Goal: decrease pain PT Goal Formulation: With patient Time For Goal Achievement: 04/14/15 Potential to Achieve Goals:  Good Progress towards PT goals: Progressing toward goals    Frequency  Min 3X/week    PT Plan Equipment recommendations need to be updated    Co-evaluation             End of Session   Activity Tolerance: Patient limited by pain Patient left: in chair;with call bell/phone within reach     Time: [id number] PT Time Calculation (min) (ACUTE ONLY): 16 min  Charges:  $Therapeutic Exercise: 8-22 mins                    G Codes:      Joslyn Hy PT, DPT [phone] Pager: [phone] 04/06/2015, 4:38 PM

## 2015-04-07 DIAGNOSIS — M009 Pyogenic arthritis, unspecified: Secondary | ICD-10-CM

## 2015-04-07 LAB — COMPREHENSIVE METABOLIC PANEL
ALBUMIN: 1.5 g/dL — AB (ref 3.5–5.0)
ALT: 14 U/L (ref 14–54)
AST: 22 U/L (ref 15–41)
Alkaline Phosphatase: 74 U/L (ref 38–126)
Anion gap: 12 (ref 5–15)
BUN: 17 mg/dL (ref 6–20)
CHLORIDE: 95 mmol/L — AB (ref 101–111)
CO2: 22 mmol/L (ref 22–32)
CREATININE: 1.11 mg/dL — AB (ref 0.44–1.00)
Calcium: 8.3 mg/dL — ABNORMAL LOW (ref 8.9–10.3)
GFR calc Af Amer: >60 mL/min (ref >60)
GFR, EST NON AFRICAN AMERICAN: 57 mL/min — AB (ref >60)
GLUCOSE: 215 mg/dL — AB (ref 65–99)
Potassium: 4.5 mmol/L (ref 3.5–5.1)
Sodium: 129 mmol/L — ABNORMAL LOW (ref 135–145)
Total Bilirubin: 0.7 mg/dL (ref 0.3–1.2)
Total Protein: 7 g/dL (ref 6.5–8.1)

## 2015-04-07 LAB — GLUCOSE, CAPILLARY
GLUCOSE-CAPILLARY: 286 mg/dL — AB (ref 65–99)
GLUCOSE-CAPILLARY: 217 mg/dL — AB (ref 65–99)
GLUCOSE-CAPILLARY: 197 mg/dL — AB (ref 65–99)

## 2015-04-07 LAB — CBC
HCT: 24.2 % — ABNORMAL LOW (ref 36.0–46.0)
Hemoglobin: 8.3 g/dL — ABNORMAL LOW (ref 12.0–15.0)
MCH: 28.9 pg (ref 26.0–34.0)
MCHC: 34.3 g/dL (ref 30.0–36.0)
MCV: 84.3 fL (ref 78.0–100.0)
PLATELETS: 439 10*3/uL — AB (ref 150–400)
RBC: 2.87 MIL/uL — AB (ref 3.87–5.11)
RDW: 15.2 % (ref 11.5–15.5)
WBC: 12.1 10*3/uL — AB (ref 4.0–10.5)

## 2015-04-07 MED ORDER — GLUCERNA SHAKE PO LIQD: 237.0000 mL | Freq: Three times a day (TID) | ORAL | Status: AC

## 2015-04-07 MED ORDER — LABETALOL HCL 200 MG PO TABS: 200.0000 mg | ORAL_TABLET | Freq: Three times a day (TID) | ORAL | Status: AC

## 2015-04-07 MED ORDER — DEXTROSE 5 % IV SOLN: 12.0000 10*6.[IU] | Freq: Two times a day (BID) | INTRAVENOUS | Status: DC

## 2015-04-07 MED ORDER — SACCHAROMYCES BOULARDII 250 MG PO CAPS: 250.0000 mg | ORAL_CAPSULE | Freq: Two times a day (BID) | ORAL | Status: DC

## 2015-04-07 MED ORDER — FERROUS SULFATE 325 (65 FE) MG PO TABS: 325.0000 mg | ORAL_TABLET | Freq: Two times a day (BID) | ORAL | Status: AC

## 2015-04-07 MED ORDER — POLYETHYLENE GLYCOL 3350 17 G PO PACK: 17.0000 g | PACK | Freq: Every day | ORAL | Status: DC

## 2015-04-07 MED ORDER — HEPARIN SOD (PORK) LOCK FLUSH 100 UNIT/ML IV SOLN
250.0000 [IU] | INTRAVENOUS | Status: AC | PRN
  Administered 2015-04-07: 1000 [IU]

## 2015-04-07 MED ORDER — PENICILLIN G POTASSIUM 20000000 UNITS IJ SOLR: INTRAVENOUS | Status: DC

## 2015-04-07 MED ORDER — SENNOSIDES-DOCUSATE SODIUM 8.6-50 MG PO TABS: 2.0000 | ORAL_TABLET | Freq: Every evening | ORAL | Status: DC | PRN

## 2015-04-07 MED ORDER — AMLODIPINE BESYLATE 10 MG PO TABS: 10.0000 mg | ORAL_TABLET | Freq: Every day | ORAL | Status: AC

## 2015-04-07 MED ORDER — LABETALOL HCL 200 MG PO TABS
200.0000 mg | ORAL_TABLET | Freq: Two times a day (BID) | ORAL | Status: DC
  Administered 2015-04-07: 200 mg via ORAL
  Filled 2015-04-07: qty 1

## 2015-04-07 MED ORDER — HEPARIN SOD (PORK) LOCK FLUSH 100 UNIT/ML IV SOLN: 250.0000 [IU] | INTRAVENOUS | Status: DC | PRN

## 2015-04-07 NOTE — Progress Notes (Signed)
   04/07/2015   To Whom It May Concern,  Lissa Morales was admitted to [facility]. [facility] from 03/27/2015 to 04/07/2015.  Please excuse her from work through February 13th.      Sincerely,    [name], MD [facility] [address], [place]  [address]  Ph:    [phone] Fax:  [phone]

## 2015-04-07 NOTE — Progress Notes (Signed)
Nsg Discharge Note  Admit Date:  03/27/2015 Discharge date: 04/07/2015   [name] to be D/C'd Home with home health per MD order.  AVS completed.  Copy for chart, and copy for patient signed, and dated. Patient able to verbalize understanding.  Discharge Medication:   Medication List    STOP taking these medications        hydrochlorothiazide 25 MG tablet  Commonly known as:  HYDRODIURIL     losartan 100 MG tablet  Commonly known as:  COZAAR     naproxen sodium 220 MG tablet  Commonly known as:  ANAPROX      TAKE these medications        acetaminophen 325 MG tablet  Commonly known as:  TYLENOL  Take 325 mg by mouth every 6 (six) hours as needed for mild pain or moderate pain.     amLODipine 10 MG tablet  Commonly known as:  NORVASC  Take 1 tablet (10 mg total) by mouth daily.     feeding supplement (GLUCERNA SHAKE) Liqd  Take 237 mLs by mouth 3 (three) times daily between meals.     ferrous sulfate 325 (65 FE) MG tablet  Take 1 tablet (325 mg total) by mouth 2 (two) times daily with a meal.     labetalol 200 MG tablet  Commonly known as:  NORMODYNE  Take 1 tablet (200 mg total) by mouth 3 (three) times daily.     metFORMIN 500 MG tablet  Commonly known as:  GLUCOPHAGE  Take 500 mg by mouth 2 (two) times daily.     penicillin G potassium in dextrose 5 % 500 mL  Please infuse 24 million units over 24 hours continuously through 04/26/2015.     polyethylene glycol packet  Commonly known as:  MIRALAX / GLYCOLAX  Take 17 g by mouth daily.     saccharomyces boulardii 250 MG capsule  Commonly known as:  FLORASTOR  Take 1 capsule (250 mg total) by mouth 2 (two) times daily.     senna-docusate 8.6-50 MG tablet  Commonly known as:  Senokot-S  Take 2 tablets by mouth at bedtime as needed for mild constipation.        Discharge Assessment: Filed Vitals:   04/07/15 0844 04/07/15 1358  BP: 156/87 154/83  Pulse: 115 108  Temp:  98.2 F (36.8 C)  Resp:  19   Skin clean, dry and intact without evidence of skin break down, no evidence of skin tears noted. Foam dressing to right shoulder intact. PT going home with PICC line. PICC line to right subclavian is clean dry and intact. IV team to come and disconnect pt from pump and flush PICC line for discharge.   D/c Instructions-Education: Discharge instructions given to patient/family with verbalized understanding. D/c education completed with patient/family including follow up instructions, medication list, d/c activities limitations if indicated, with other d/c instructions as indicated by MD - patient able to verbalize understanding, all questions fully answered. Patient instructed to return to ED, call 911, or call MD for any changes in condition.  Patient waiting for IV ABx to finish. Pt family members will assist pt with getting dressed. RN will continue to monitor pt till pt is discharged from unit.   Dorita Fray, RN 04/07/2015 6:07 PM

## 2015-04-07 NOTE — Progress Notes (Signed)
Occupational Therapy Treatment Patient Details Name: [name] MRN: [id number] DOB: December 03, 1965 Today's Date: 04/07/2015    History of present illness 49/F with DM, HTN admitted with sepsis, AKI, found to have GAS bacteremia, R shoulder Septic arthritis, NSVT, ID and Cards following with pt with I& D right shoulder.    OT comments  Completed  Education regarding HEP for RUE. Sister present for education.Written information given. Recommend pt follow up with [place].   Follow Up Recommendations  Home health OT;Supervision/Assistance - 24 hour    Equipment Recommendations  Tub/shower seat    Recommendations for Other Services      Precautions / Restrictions Precautions Precautions: Other (comment) (painful RUE) Required Braces or Orthoses: Sling (comfort)       Mobility Bed Mobility                  Transfers                      Balance                                   ADL                                         General ADL Comments: Sister present. educated sister on compensatory technqiues for UB ADL and sling management. Sister verbalized understanding.       Vision                     Perception     Praxis      Cognition   Behavior During Therapy: [employer] for tasks assessed/performed Overall Cognitive Status: Within Functional Limits for tasks assessed                       Extremity/Trunk Assessment               Exercises  Completed education on HEP with pt/fmaily. Pt able to return demonstrate.    Shoulder Instructions       General Comments      Pertinent Vitals/ Pain       Pain Assessment: 0-10 Pain Score: 5  Pain Location: R shoudler with movement Pain Descriptors / Indicators: Sore Pain Intervention(s): Limited activity within patient's tolerance  Home Living                                          Prior Functioning/Environment               Frequency Min 3X/week     Progress Toward Goals  OT Goals(current goals can now be found in the care plan section)  Progress towards OT goals: Goals met/education completed, patient discharged from OT. To continue with HHOT  Acute Rehab OT Goals Patient Stated Goal: decrease pain OT Goal Formulation: With patient Time For Goal Achievement: 04/19/15 Potential to Achieve Goals: Good ADL Goals Pt/caregiver will Perform Home Exercise Program: Increased ROM;Right Upper extremity;With written HEP provided Additional ADL Goal #1: Pt/family will verbalize understadning of copensatory techniques for ADL Additional ADL Goal #3: Pt/family will verbalize understanding of use of elevation/ice for RUE t reduce edema Additional ADL Goal #4: Pt/family  will demosntrate understanding of retrograde massage R hand to reduce dependent edema.  Plan Discharge plan remains appropriate    Co-evaluation                 End of Session     Activity Tolerance Patient tolerated treatment well   Patient Left in chair;with call bell/phone within reach;with family/visitor present   Nurse Communication Mobility status        Time: 6712-4580 OT Time Calculation (min): 13 min  Charges: OT General Charges $OT Visit: 1 Procedure OT Treatments $Therapeutic Activity: 8-22 mins  Dwan Hemmelgarn,HILLARY 04/07/2015, 3:48 PM  [facility], OTR/L  [phone] 04/07/2015

## 2015-04-07 NOTE — Discharge Instructions (Signed)
Weight bearing as tolerated in the R shoulder.  Sling for comfort.

## 2015-04-07 NOTE — Progress Notes (Signed)
RN wheeled pt to the main entrance and helped her to her family member's car.

## 2015-04-07 NOTE — Progress Notes (Signed)
Occupational Therapy Treatment Patient Details Name: [name] MRN: [id number] DOB: Sep 27, 1965 Today's Date: 04/07/2015    History of present illness 49/F with DM, HTN admitted with sepsis, AKI, found to have GAS bacteremia, R shoulder Septic arthritis, NSVT, ID and Cards following with pt with I& D right shoulder.    OT comments  Pt making good progress today. Brighter affect. Using RUE during functional tasks as tolerated. Pt tolerating pendulums, lap slides and table slides with c/o pain mostly in ER. Improved A/AA amd PROM overall. Decreassing edema throughout RUE. Continue to encourage use of ice and elevation. Pt fatigues easily but is very motivated to return to PLOF. Continue to recommend pt follow up with [place] and eventually progress to outpt services if needed to assist with return to work when appropriate.  Will update pt's written HEP prior to D/C.   Follow Up Recommendations  Home health OT;Supervision/Assistance - 24 hour    Equipment Recommendations  Tub/shower seat    Recommendations for Other Services      Precautions / Restrictions Precautions Required Braces or Orthoses: Sling (for comfort) Restrictions Weight Bearing Restrictions: Yes RUE Weight Bearing: Weight bearing as tolerated       Mobility Bed Mobility Overal bed mobility: Modified Independent                Transfers Overall transfer level: Modified independent                    Balance      No LOB noted throughout session                             ADL                                         General ADL Comments: Pt up ambulating in room. Just finished her bath and was wiping up a spill from the floor. Brighter affect. Less c/o pain RUE. Using RUE more functionally.. Again recomended pt wear her sling for comfort when ambulating longer distances if needed  "It feels good to do things for myself"      Vision                      Perception     Praxis      Cognition   Behavior During Therapy: [facility] for tasks assessed/performed Overall Cognitive Status: Within Functional Limits for tasks assessed                       Extremity/Trunk Assessment   RUE with decreased edema and overall increased ROM in A/AA and PROM. Using RUE for functional tasks today. Pt states "it doesn't hurt as much" Able to complete AROM with elbow flex/ext in supine with increased control of shoulder            Exercises Shoulder Exercises Pendulum Exercise: AAROM;20 reps;Standing Shoulder Flexion: AAROM;Standing;Other (comment) ("table slides"/15-20 reps) Shoulder ABduction: AAROM;Standing;Other (comment) (counter slides) Shoulder External Rotation: AAROM;10 reps;Supine;Other (comment) ("dowel". instructions to hold x 10 at end range.) Elbow Flexion: AROM;AAROM;Supine;10 reps Elbow Extension: AROM;AAROM;Right;10 reps;Supine Wrist Flexion: AROM;Right;10 reps Wrist Extension: AROM;Right;10 reps Digit Composite Flexion: AROM;Squeeze ball;20 reps Composite Extension: AROM;15 reps   Shoulder Instructions       General Comments  Pertinent Vitals/ Pain       Pain Assessment: 0-10 Pain Score: 6  Pain Location: RUE Pain Descriptors / Indicators: Aching Pain Intervention(s): Limited activity within patient's tolerance;Repositioned;Ice applied  Home Living                                          Prior Functioning/Environment              Frequency Min 3X/week     Progress Toward Goals  OT Goals(current goals can now be found in the care plan section)  Progress towards OT goals: Progressing toward goals  Acute Rehab OT Goals Patient Stated Goal: decrease pain OT Goal Formulation: With patient Time For Goal Achievement: 04/19/15 Potential to Achieve Goals: Good ADL Goals Pt/caregiver will Perform Home Exercise Program: Increased ROM;Right Upper extremity;With written HEP  provided Additional ADL Goal #1: Pt/family will verbalize understadning of copensatory techniques for ADL Additional ADL Goal #3: Pt/family will verbalize understanding of use of elevation/ice for RUE t reduce edema Additional ADL Goal #4: Pt/family will demosntrate understanding of retrograde massage R hand to reduce dependent edema.  Plan Discharge plan remains appropriate    Co-evaluation                 End of Session     Activity Tolerance Patient tolerated treatment well   Patient Left in bed;with call bell/phone within reach   Nurse Communication Mobility status        Time: 1000-1023 OT Time Calculation (min): 23 min  Charges: OT General Charges $OT Visit: 1 Procedure OT Treatments $Therapeutic Activity: 23-37 mins  Jayleene Glaeser,HILLARY 04/07/2015, 10:28 AM   Maurie Boettcher, OTR/L  [phone] 04/07/2015

## 2015-04-07 NOTE — Discharge Summary (Addendum)
Physician Discharge Summary  [name] HZ:[id number] DOB: [birth year] DOA: 03/27/2015  PCP: [name], MD  Admit date: 03/27/2015 Discharge date: 04/07/2015  Recommendations for Outpatient Follow-up:  1. Home with home health services 2. PenG continuous infusion to continue through 04/26/2015, then stop.  Patient to follow up with infectious disease before completion of antibiotics to ensure infection has cleared and course does not need to be prolonged 3. F/u with Dr. Percell Miller in 1 week or sooner if needed regarding septic arthritis  4. F/u with Dr. Megan Salon, ID in 2 weeks 5. F/u with [facility] for iron deficiency anemia with occult positive stool  Discharge Diagnoses:  Principal Problem:   Streptococcal bacteremia Active Problems:   Acute renal failure (ARF) (HCC)   Abdominal pain, vomiting, and diarrhea   Posterior cervical adenopathy   Leukocytosis   Diabetes mellitus (HCC)   Hypertension   Polyarthralgia   NSVT (nonsustained ventricular tachycardia) (HCC)   Sinus tachycardia (HCC)   Neck pain on left side   Arthritis   Pleural effusion right with thoracentesis 1/18   Acute respiratory failure with hypoxia (HCC)   Confusion, postoperative   Essential hypertension   Septic arthritis of shoulder, right ([place])   Discharge Condition: stable, improved  Diet recommendation: diabetic  Wt Readings from Last 3 Encounters:  03/30/15 88.1 kg (194 lb 3.6 oz)    History of present illness:   [name] is a 50 y.o. female with hx of DM (no meds), HTN and uterine fibroids who had sudden onset of rigors 2 days prior to admission followed by epigastric pain, nausea, vomiting and mild diarrhea. She then began to have severe left-sided posterior neck pain leading her to come to the hospital. She had low-grade fever of 100.5 and acute renal insufficiency. Her chest x-ray was negative. Abdominal ultrasound was normal with the exception of questionable cholelithiasis. Her  urinalysis did show some pyuria but she has not been having any urinary tract symptoms. She was started on empiric daptomycin and piperacillin tazobactam (vancomycin was avoided because of concerns about potential nephrotoxicity).  Her initial blood cultures quickly grew gram-positive cocci in pairs and chains.    Brief summary of hospitalization:  She was found to have septic arthritis of the right shoulder probably seeded after tonsillitis/adenitis progressed to bacteremia.  She had a washout of her shoulder by Dr. Percell Miller, orthopedics.  Her AKI resolved and her pain in her right arm improved.  She continued to have intermittent fevers and will continue PenG infusion as outpatient for a total of 4 weeks starting from 1/17, the day of her joint irrigation.  She will see infectious disease prior to cessation of her antibiotics.    Hospital Course by problem:   Sepsis with S pyogenes bacteremia and Septic arthritis of the right shoulder, present at the time of admission.  She was started on broad spectrum antibiotics with daptomycin and zosyn, however, these were narrowed to Pen G based on speciation and sensitivities.  Initially, there was concern for endocarditis, however, ID was consulted and felt endocarditis was less likely so TEE was canceled.  Her Right shoulder MRI demonstrated an effusion and Orthopedics was consulted.  She underwent joint I&D/irrigation on 1/17.  She continued to have severe pain, low grade fevers, and repeat MRI demonstrated persistent effusion and soft tissue edema, however, because the fluid was not purulent originally, orthopedics Dr. Percell Miller felt continued antibiotics and observation should be pursued for now.  Her recurrent fevers were discussed with ID on  multiple occasions, however, it was felt this was to be expected given the source and severity of her infection.  No further testing was recommended.   Disseminated GAS bacteremia - on penicillin and WBC improved, seems to be  improving overall.  Continue penicillin (if available in a pump) for 4 weeks total from surgery 1/17 through February 13th.  Weekly cmp, cbc to [facility] Infectious disease will arrange follow up in [facility] in about 2 weeks  Hyponatremia This has remained stable throughout this hospitalization 127-133 Asymptomatic  Right upper extremity swelling due to superficial thrombosis noted in the basilic vein of the right upper extremity and septic arthritis, improving.    Iron deficiency anemia  Patient received a total of 2 units of packed red blood cells during this admission Anemia panel shows iron deficiency and the patient was started on iron supplementation Occult stool was positive Patient is to follow-up at [facility] gastroenterology for endoscopic evaluation once her bacteremia resolves  Right lower lobe infiltrate/HCAP.  She had respiratory distress requiring bipap after her shoulder debridement and CXR demonstrated a small to moderate-sized pleural effusion, possible right basilar air space opacity, atelectasis versus pneumonia.  Per Pulmonology and Radiology, there was not enough fluid for thoracentesis.  Her respiratory distress improved with lasix.   Patient currently on penicillin G x 4 weeks Repeat CXR near end of antibiotic course  AKI due to sepsis, dehydration, ARB, and NSAIDS -  Creatinine trended to 1.1 with cessation of offending medications and IVF  -Nephrology consulted. Ultrasounds of abdomen and pelvis showed signs of medico renal disease and no signs of obstruction.  DM2--controlled, metformin was hold and she was given SSI, Hbaic was 7.  Her creatinine improved and she resumed metformin at discharge.   Nonsustained ventricular tachycardia -had an episode of 30 beats 1/15 pm -continue metoprolol, replaced K and Mag -2d ECHO with normal EF and wall motion; no structural abnormalities -Cards signed off 1/20  Elevated Bili, secondary to sepsis and cholestasis, ALP, AST/ALT  WNL, improved  HTN, blood pressures mildly elevated but trending down with improved kidney function and resolving pain.  Continue norvasc and changed metoprolol to labetalol which can be adjusted as an outpatient.    Elevated B-HCG,  -she is not pregnant, LMP>1year ago, menopausal now, Pelvic US negative for gestational sa -suspect this could be from her pituitary gland, needs to be followed, GYN As outpatient.  Repeat B-HCG was negative.     Procedures:  Right shoulder joint irrigation/ I&D 1/17 by Dr. Percell Miller  Consultations:  Infectious disease  Cardiology  Radiology  Orthopedic surgery  PCCM  Nephrology  Discharge Exam: Filed Vitals:   04/07/15 0844 04/07/15 1358  BP: 156/87 154/83  Pulse: 115 108  Temp:  98.2 F (36.8 C)  Resp:  19   Filed Vitals:   04/06/15 2107 04/07/15 0450 04/07/15 0844 04/07/15 1358  BP: 163/82 168/91 156/87 154/83  Pulse: 119 110 115 108  Temp: 100.7 F (38.2 C) 99.9 F (37.7 C)  98.2 F (36.8 C)  TempSrc: Oral Oral    Resp: 18 18  19   Height:      Weight:      SpO2: 95% 99%      General: adult female, NAD Cardiovascular: RRR, no mrg, 2+ pulses Respiratory: CTAB, no rales or diminished BS at bases ABD:  NABS, soft, ND/NT MSK:  No erythema or discharge from anterior shoulder incision, 1+ swelling of right arm which is elevated.  TTP along shoulder and biceps,  triceps.  Restricted ROM of shoulder but able to move 30-degrees.  Also able to range elbow and wrists.    Discharge Instructions      Discharge Instructions    Call MD for:  difficulty breathing, headache or visual disturbances    Complete by:  As directed      Call MD for:  extreme fatigue    Complete by:  As directed      Call MD for:  hives    Complete by:  As directed      Call MD for:  persistant dizziness or light-headedness    Complete by:  As directed      Call MD for:  persistant nausea and vomiting    Complete by:  As directed      Call MD for:   redness, tenderness, or signs of infection (pain, swelling, redness, odor or green/yellow discharge around incision site)    Complete by:  As directed      Call MD for:  severe uncontrolled pain    Complete by:  As directed      Change dressing (specify)    Complete by:  As directed   Dressing change:  As needed or every 3 days     Diet Carb Modified    Complete by:  As directed      Increase activity slowly    Complete by:  As directed      Weight bearing as tolerated    Complete by:  As directed   Laterality:  right  Extremity:  Upper            Medication List    STOP taking these medications        hydrochlorothiazide 25 MG tablet  Commonly known as:  HYDRODIURIL     losartan 100 MG tablet  Commonly known as:  COZAAR     naproxen sodium 220 MG tablet  Commonly known as:  ANAPROX      TAKE these medications        acetaminophen 325 MG tablet  Commonly known as:  TYLENOL  Take 325 mg by mouth every 6 (six) hours as needed for mild pain or moderate pain.     amLODipine 10 MG tablet  Commonly known as:  NORVASC  Take 1 tablet (10 mg total) by mouth daily.     feeding supplement (GLUCERNA SHAKE) Liqd  Take 237 mLs by mouth 3 (three) times daily between meals.     ferrous sulfate 325 (65 FE) MG tablet  Take 1 tablet (325 mg total) by mouth 2 (two) times daily with a meal.     labetalol 200 MG tablet  Commonly known as:  NORMODYNE  Take 1 tablet (200 mg total) by mouth 3 (three) times daily.     metFORMIN 500 MG tablet  Commonly known as:  GLUCOPHAGE  Take 500 mg by mouth 2 (two) times daily.     penicillin G potassium in dextrose 5 % 500 mL  Please infuse 24 million units over 24 hours continuously through 04/26/2015.     polyethylene glycol packet  Commonly known as:  MIRALAX / GLYCOLAX  Take 17 g by mouth daily.     saccharomyces boulardii 250 MG capsule  Commonly known as:  FLORASTOR  Take 1 capsule (250 mg total) by mouth 2 (two) times daily.      senna-docusate 8.6-50 MG tablet  Commonly known as:  Senokot-S  Take 2 tablets by mouth at bedtime as  needed for mild constipation.       Follow-up Information    Follow up with [name], MD. Schedule an appointment as soon as possible for a visit on 04/12/2015.   Specialty:  Internal Medicine   Why:  Appointment with Dr. Delfina Redwood is on 04/12/15 at 2:15pm   Contact information:   [address] [place] [address] [phone]       Follow up with [facility].   Why:  For home health nurse, aide, physical and occupational therapies, and IV Infusion services for antibiotics.    Contact information:   [address] [place] [address] [phone]       Follow up with Michel Bickers, MD. Schedule an appointment as soon as possible for a visit on 04/20/2015.   Specialty:  Infectious Diseases   Why:  Appointment with Dr. Megan Salon is on 04/20/15 at 3:30pm   Contact information:   [address] [place] [address] [phone]       Follow up with [facility]. Schedule an appointment as soon as possible for a visit in 1 month.   Why:  [place] will be giving you a call tomorrow with the appointment day and time   Contact information:   [place] [address] [phone]       Follow up with MURPHY, Ernesta Amble, MD. Schedule an appointment as soon as possible for a visit in 1 week.   Specialty:  Orthopedic Surgery   Why:  Appointment with Dr. Percell Miller is on 04/14/15 at 9:15am   Contact information:   [address] [place] [address] [phone]        The results of significant diagnostics from this hospitalization (including imaging, microbiology, ancillary and laboratory) are listed below for reference.    Significant Diagnostic Studies: Mr Humerus Right Wo Contrast  04/06/2015  CLINICAL DATA:  Pain and swelling of the right upper extremity EXAM: MRI OF THE RIGHT  HUMERUS WITHOUT CONTRAST TECHNIQUE: Multiplanar, multisequence MR imaging was performed. No intravenous contrast was administered. COMPARISON:  03/29/2015 FINDINGS: Again observed is a large glenohumeral joint effusion with edema tracking along adjacent muscle plans including the deltoid and rotator cuff musculature. The deltoid edema is most confluent anteriorly and there is a small amount of edema tracking along the superficial margin of the pectoralis major muscle. Edema tracks along the superficial fascia margin of the right rib cage. Fluid tracks along the superficial fascia margins of the biceps and triceps. There is muscular edema of the lateral head of the triceps and to a lesser extent medially in the brachialis. No significant elbow joint effusion although there is considerable subcutaneous edema along the elbow. I do not observe a drainable abscess. Edema tracking along the superficial fascia margins extends into the forearm. IMPRESSION: 1. There continues to be a right glenohumeral joint effusion which could be septic. Considerable edema in the surrounding rotator cuff and deltoid musculature. 2. Infiltrative edema also tracks along the margins (primarily the superficial fascia margins) of the pectoralis, biceps, and triceps musculature. There is no elbow joint effusion. Subcutaneous edema tracks into the forearm. I do not see a discrete drainable abscess. Potential myositis involving the rotator cuff musculature, anterior deltoid, and lateral head of the triceps. Electronically Signed   By: Van Clines M.D.   On: 04/06/2015 07:14   US Soft Tissue Head/neck  03/27/2015  CLINICAL DATA:  Left-sided neck  pain EXAM: ULTRASOUND LEFT NECK TECHNIQUE: Longitudinal and transverse images of the left neck region in the area of pain performed COMPARISON:  None. FINDINGS: Sonographic images of the left neck show no mass or inflammatory focus. No abnormal fluid collection. In particular, no abscess. Several  tiny lymph nodes are noted in this area; by size criteria, there is no adenopathy in this area. Vascular structures of this area are widely patent. IMPRESSION: Scattered subcentimeter lymph nodes in the left neck, not meeting size criteria for pathologic significance. No mass or inflammatory focus appreciable. No abscess in particular. Electronically Signed   By: Lowella Grip III M.D.   On: 03/27/2015 17:19   [place] Chest  03/31/2015  CLINICAL DATA:  50 year old female with chest x-ray demonstrating right-sided small pleural effusion. EXAM: CHEST ULTRASOUND COMPARISON:  None. FINDINGS: Ultrasound survey of the right chest demonstrating small complex pleural effusion, not amenable to ultrasound-guided drainage. IMPRESSION: Small, complex appearing right pleural effusion not amenable for ultrasound-guided thoracentesis. Signed, Dulcy Fanny. Earleen Newport, DO [facility] Radiology Electronically Signed   By: [name] D.O.   On: 03/31/2015 11:05   US Abdomen Complete  03/27/2015  CLINICAL DATA:  Septic, generalized upper and lower abdominal pain, positive pregnancy test EXAM: ABDOMEN ULTRASOUND COMPLETE COMPARISON:  None FINDINGS: Gallbladder: Dependent echogenic material with minimal shadowing likely calculi in gallbladder. No gallbladder wall thickening, pericholecystic fluid, or sonographic Murphy sign. Common bile duct: Diameter: Normal caliber 4 mm diameter Liver: Normal appearance IVC: Normal appearance Pancreas: Distal tail obscured by bowel gas. Visualized portion normal appearance. Spleen: Normal appearance, 8.5 cm length Right Kidney: Length: 12.5 cm. Increased cortical echogenicity. Normal cortical thickness. No mass or hydronephrosis. Left Kidney: Length: 12.7 cm. Increased cortical echogenicity. Normal cortical thickness. No mass or hydronephrosis. Abdominal aorta: Normal caliber Other findings: No free fluid IMPRESSION: Probable medical renal disease  changes. Probable cholelithiasis without evidence acute cholecystitis. Remainder of exam unremarkable. Electronically Signed   By: Lavonia Dana M.D.   On: 03/27/2015 17:29   US Ob Comp Less 14 Wks  03/27/2015  CLINICAL DATA:  Low beta HCG of 27. Abdominal pain with sepsis. Elevated white blood cell count. EXAM: OBSTETRIC <14 WK [place] AND TRANSVAGINAL OB US TECHNIQUE: Both transabdominal and transvaginal ultrasound examinations were performed for complete evaluation of the gestation as well as the maternal uterus, adnexal regions, and pelvic cul-de-sac. Transvaginal technique was performed to assess early pregnancy. COMPARISON:  Ultrasound 02/13/2012 FINDINGS: Intrauterine gestational sac: None Yolk sac:  Not present Embryo:  Not present Maternal uterus/adnexae: The uterus is lobular and heterogeneous echotexture consistent multiple leiomyoma. This appears similar to the ultrasound of 2013. The endometrium is difficult to identified on the background of the multiple leiomyoma but measures 8 mm. Additionally, the ovaries are not identified. There is reported history of a LEFT oophorectomy in 2008. No free fluid. IMPRESSION: 1. Leiomyomatous uterus similar to 2013. 2. No decidual reaction or gestational sac identified. 3. No intrauterine gestational sac, yolk sac, or fetal pole identified. Differential considerations include intrauterine pregnancy too early to be sonographically visualized, missed abortion, or ectopic pregnancy. Followup ultrasound is recommended in 10-14 days for further evaluation. Electronically Signed   By: Suzy Bouchard M.D.   On: 03/27/2015 18:07   US Ob Transvaginal  03/27/2015  CLINICAL DATA:  Low beta HCG of 27. Abdominal pain with sepsis. Elevated white blood cell count. EXAM: OBSTETRIC <14 WK [place] AND TRANSVAGINAL OB US TECHNIQUE: Both transabdominal and transvaginal ultrasound examinations were  performed for complete evaluation of the gestation as well as the maternal uterus, adnexal  regions, and pelvic cul-de-sac. Transvaginal technique was performed to assess early pregnancy. COMPARISON:  Ultrasound 02/13/2012 FINDINGS: Intrauterine gestational sac: None Yolk sac:  Not present Embryo:  Not present Maternal uterus/adnexae: The uterus is lobular and heterogeneous echotexture consistent multiple leiomyoma. This appears similar to the ultrasound of 2013. The endometrium is difficult to identified on the background of the multiple leiomyoma but measures 8 mm. Additionally, the ovaries are not identified. There is reported history of a LEFT oophorectomy in 2008. No free fluid. IMPRESSION: 1. Leiomyomatous uterus similar to 2013. 2. No decidual reaction or gestational sac identified. 3. No intrauterine gestational sac, yolk sac, or fetal pole identified. Differential considerations include intrauterine pregnancy too early to be sonographically visualized, missed abortion, or ectopic pregnancy. Followup ultrasound is recommended in 10-14 days for further evaluation. Electronically Signed   By: Suzy Bouchard M.D.   On: 03/27/2015 18:07   Mr Shoulder Right Wo Contrast  03/30/2015  CLINICAL DATA:  Joint pain most severe in the right shoulder. Sepsis. EXAM: MRI OF THE RIGHT SHOULDER WITHOUT CONTRAST TECHNIQUE: Multiplanar, multisequence MR imaging of the shoulder was performed. No intravenous contrast was administered. COMPARISON:  03/27/2015 FINDINGS: Rotator cuff: Prominent partial thickness articular surface tearing of the supraspinatus, with the articular surface portion retracted 1.7 cm on image 16 series 5, compatible with tendon delamination. Moderate tendinopathy of the remaining supraspinatus tendon. Similarly there is partial thickness articular surface tearing of the infraspinatus tendon with moderate infraspinatus tendinopathy. Moderate subscapularis tendinopathy is present along with potential partial thickness articular surface tearing Muscles: Abnormal edema tracks primarily along the  marginal portions of the supraspinatus, subscapularis, teres minor, and teres major muscles. There is also low-level edema along the superficial fascia margin of the lateral deltoid. Biceps long head: Partial tearing or advanced tendinopathy of the intra-articular segment. Acromioclavicular Joint: Mild degenerative AC joint arthropathy. Subacromial morphology is type 2 (curved). Small but abnormal amount of fluid in the subacromial subdeltoid bursa. Glenohumeral Joint: Prominent glenohumeral joint effusion. Moderate degenerative chondral thinning along the glenohumeral joint. Labrum: Linear irregularity in the superior labrum with a blunted appearance, compatible with SLAP tear extending into the biceps anchor. Bones: Unusual flaring of the proximal humeral metadiaphysis, query prior fracture. Small right axillary lymph nodes are observed. IMPRESSION: 1. Large shoulder joint effusion with abnormal edema tracking within and along the margins of the regional musculature. In the setting of sepsis and new onset shoulder pain, there is a high degree of concern for septic glenohumeral joint, and arthrocentesis is likely indicated. 2. Partial thickness articular surface tearing of the supraspinatus and infraspinatus with tendon delamination. There is potentially mild partial thickness articular surface tearing of the subscapularis. Considerable tendinopathy of these tendons as well. 3. Advanced tendinopathy or partial tearing of the long head of the biceps. 4. Increased signal in the superior labrum compatible with SLAP tear extending into the biceps anchor. 5. Subacromial subdeltoid bursitis. 6. Somewhat unusual flaring of the proximal humeral metadiaphysis, query prior fracture. 7. Moderate degenerative glenohumeral arthropathy. These results will be called to the ordering clinician or representative by the Radiologist Assistant, and communication documented in the PACS or zVision Dashboard. Electronically Signed   By:  Van Clines M.D.   On: 03/30/2015 07:28   Ir Fluoro Guide Cv Line Right  04/01/2015  CLINICAL DATA:  50 year old female with a history of septic arthritis. She has been referred for tunneled catheter placement for  antibiotics. EXAM: IR RIGHT FLOURO GUIDE CV LINE; IR ULTRASOUND GUIDANCE VASC ACCESS RIGHT Date: 04/01/2015 ANESTHESIA/SEDATION: None Total sedation time: 0 minutes FLUOROSCOPY TIME:  30 second TECHNIQUE: The procedure, risks, benefits, and alternatives were explained to the patient. Questions regarding the procedure were encouraged and answered. The patient understands and consents to the procedure. The right neck and chest was prepped with chlorhexidine, and draped in the usual sterile fashion using maximum barrier technique (cap and mask, sterile gown, sterile gloves, large sterile sheet, hand hygiene and cutaneous antiseptic). Local anesthesia was attained by infiltration with 1% lidocaine without epinephrine. Ultrasound demonstrated patency of the right internal jugular vein, and this was documented with an image. Under real-time ultrasound guidance, this vein was accessed with a 21 gauge micropuncture needle and image documentation was performed. A small dermatotomy was made at the access site with an 11 scalpel. A 0.018" wire was advanced into the SVC and the access needle exchanged for a 130F micropuncture vascular sheath. The 0.018" wire was then removed and a 0.035" wire advanced into the IVC. Peel-away sheath was placed over the wire, and upon removal of the wire appropriate internal length was obtained/measured. Skin and subcutaneous tissues on the right chest wall just inferior to the clavicle were infiltrated with 1% lidocaine for local anesthesia. Small stab incision was made with 11 blade scalpel. The catheter was back tunneled to the incision at the neck, with the catheter pulled through the subcutaneous tunnel. Catheter was then ligated at the appropriate length at the neck and  passed through the peel-away sheath. Peel-away sheath was removed. Final image was stored. Catheter was then sutured onto the right chest wall with retention sutures. The dermatotomy at the venous access site was also closed with Dermabond. Patient tolerated the procedure well and remained hemodynamically stable throughout. No complications were encountered and no significant blood loss was encountered. COMPLICATIONS: None IMPRESSION: Status post placement of right IJ tunneled catheter measuring 24 centimeter. Catheter ready for use. Signed, Dulcy Fanny. Earleen Newport, DO [facility] Radiology Electronically Signed   By: [name] D.O.   On: 04/01/2015 14:45   Ir US Guide Vasc Access Right  04/01/2015  CLINICAL DATA:  50 year old female with a history of septic arthritis. She has been referred for tunneled catheter placement for antibiotics. EXAM: IR RIGHT FLOURO GUIDE CV LINE; IR ULTRASOUND GUIDANCE VASC ACCESS RIGHT Date: 04/01/2015 ANESTHESIA/SEDATION: None Total sedation time: 0 minutes FLUOROSCOPY TIME:  30 second TECHNIQUE: The procedure, risks, benefits, and alternatives were explained to the patient. Questions regarding the procedure were encouraged and answered. The patient understands and consents to the procedure. The right neck and chest was prepped with chlorhexidine, and draped in the usual sterile fashion using maximum barrier technique (cap and mask, sterile gown, sterile gloves, large sterile sheet, hand hygiene and cutaneous antiseptic). Local anesthesia was attained by infiltration with 1% lidocaine without epinephrine. Ultrasound demonstrated patency of the right internal jugular vein, and this was documented with an image. Under real-time ultrasound guidance, this vein was accessed with a 21 gauge micropuncture needle and image documentation was performed. A small dermatotomy was made at the access site with an 11 scalpel. A 0.018" wire was advanced  into the SVC and the access needle exchanged for a 130F micropuncture vascular sheath. The 0.018" wire was then removed and a 0.035" wire advanced into the IVC. Peel-away sheath was placed over the wire, and upon removal of the wire appropriate internal length was obtained/measured. Skin  and subcutaneous tissues on the right chest wall just inferior to the clavicle were infiltrated with 1% lidocaine for local anesthesia. Small stab incision was made with 11 blade scalpel. The catheter was back tunneled to the incision at the neck, with the catheter pulled through the subcutaneous tunnel. Catheter was then ligated at the appropriate length at the neck and passed through the peel-away sheath. Peel-away sheath was removed. Final image was stored. Catheter was then sutured onto the right chest wall with retention sutures. The dermatotomy at the venous access site was also closed with Dermabond. Patient tolerated the procedure well and remained hemodynamically stable throughout. No complications were encountered and no significant blood loss was encountered. COMPLICATIONS: None IMPRESSION: Status post placement of right IJ tunneled catheter measuring 24 centimeter. Catheter ready for use. Signed, Dulcy Fanny. Earleen Newport, DO [facility] Electronically Signed   By: [name] D.O.   On: 04/01/2015 14:45   Dg Chest Port 1 View  03/31/2015  CLINICAL DATA:  Acute onset of shortness of breath. Initial encounter. EXAM: PORTABLE CHEST 1 VIEW COMPARISON:  Chest radiograph from 03/27/2015 FINDINGS: A small to moderate right-sided pleural effusion is noted. Right basilar airspace opacity may reflect atelectasis or pneumonia. Asymmetric interstitial edema could have a similar appearance. Underlying vascular congestion is seen. No pneumothorax is identified. The cardiomediastinal silhouette is borderline enlarged. No acute osseous abnormalities are seen. IMPRESSION: 1. Small to  moderate right-sided pleural effusion noted. Right basilar airspace opacity may reflect atelectasis or pneumonia. Asymmetric interstitial edema could have a similar appearance. 2. Underlying vascular congestion and borderline cardiomegaly. Electronically Signed   By: Garald Balding M.D.   On: 03/31/2015 01:49   Dg Chest Port 1 View  03/27/2015  CLINICAL DATA:  Fever for 3 days EXAM: PORTABLE CHEST 1 VIEW COMPARISON:  None. FINDINGS: Lungs are clear. Heart is upper normal in size with pulmonary vascularity within normal limits. No adenopathy. No bone lesions. IMPRESSION: No edema or consolidation. Electronically Signed   By: Lowella Grip III M.D.   On: 03/27/2015 15:43   Dg Fluoro Guided Needle Plc Aspiration/injection Loc  03/30/2015  CLINICAL DATA:  Prior MRI suspicious for septic glenohumeral joint. Sepsis. EXAM: FLUORO GUIDED NEEDLE PLACEMENT FLUOROSCOPY TIME:  If the device does not provide the exposure index: Fluoroscopy Time (in minutes and seconds):  1 minutes and 42 seconds Number of Acquired Images:  None COMPARISON:  MRI of 03/29/2015. FINDINGS: Informed written and verbal consent were obtained. Risks and benefits of the procedure were discussed. A "Time out" was performed. The superior medial portion of the right humeral head was localized under fluoroscopic guidance. Skin was prepped and draped in standard sterile fashion. Skin was numbed with 1% lidocaine. A 20 gauge needle was inserted into the joint. No fluid could be aspirated. Intra-articular location was confirmed with approximately 6 cc of Omnipaque 180. Subsequent, approximately 5 cc of saline were injected. Only minimal fluid could be returned. IMPRESSION: Uncomplicated aspiration of the right shoulder, as detailed above. Minimal fluid was obtained, and sent to laboratory as requested. Electronically Signed   By: Abigail Miyamoto M.D.   On: 03/30/2015 12:11    Microbiology: Recent Results (from the past 240 hour(s))  Gram stain      Status: None   Collection Time: 03/30/15 10:52 AM  Result Value Ref Range Status   Specimen Description FLUID SYNOVIAL RIGHT SHOULDER  Final   Special Requests NONE  Final   Gram Stain  Final    MODERATE WBC PRESENT, PREDOMINANTLY PMN FEW GRAM POSITIVE COCCI IN PAIRS IN CHAINS    Report Status 03/30/2015 FINAL  Final  Fungus Culture with Smear     Status: None (Preliminary result)   Collection Time: 03/30/15 10:52 AM  Result Value Ref Range Status   Specimen Description FLUID SYNOVIAL RIGHT SHOULDER  Final   Special Requests NONE  Final   Fungal Smear   Final    NO YEAST OR FUNGAL ELEMENTS SEEN Performed at [facility]    Culture   Final    CULTURE IN PROGRESS FOR FOUR WEEKS Performed at [facility]    Report Status PENDING  Incomplete  Anaerobic culture     Status: None   Collection Time: 03/30/15  7:42 PM  Result Value Ref Range Status   Specimen Description SYNOVIAL RIGHT SHOULDER  Final   Special Requests PATIENT ON FOLLOWING PENICILLIN  Final   Gram Stain   Final    ABUNDANT WBC PRESENT, PREDOMINANTLY PMN NO ORGANISMS SEEN    Culture NO ANAEROBES ISOLATED  Final   Report Status 04/05/2015 FINAL  Final  Body fluid culture     Status: None   Collection Time: 03/30/15  7:43 PM  Result Value Ref Range Status   Specimen Description SYNOVIAL RIGHT SHOULDER  Final   Special Requests PATIENT ON FOLLOWING PENICILLIN  Final   Gram Stain   Final    ABUNDANT WBC PRESENT, PREDOMINANTLY PMN NO ORGANISMS SEEN    Culture NO GROWTH 3 DAYS  Final   Report Status 04/03/2015 FINAL  Final  MRSA PCR Screening     Status: None   Collection Time: 03/31/15  1:55 AM  Result Value Ref Range Status   MRSA by PCR NEGATIVE NEGATIVE Final    Comment:        The GeneXpert MRSA Assay (FDA approved for NASAL specimens only), is one component of a comprehensive MRSA colonization surveillance program. It is not intended to diagnose MRSA infection nor to guide or monitor  treatment for MRSA infections.   Culture, blood (Routine X 2) w Reflex to ID Panel     Status: None (Preliminary result)   Collection Time: 04/03/15  8:10 PM  Result Value Ref Range Status   Specimen Description BLOOD LEFT ARM  Final   Special Requests BOTTLES DRAWN AEROBIC AND ANAEROBIC 5CC  Final   Culture NO GROWTH 4 DAYS  Final   Report Status PENDING  Incomplete  Culture, blood (Routine X 2) w Reflex to ID Panel     Status: None (Preliminary result)   Collection Time: 04/03/15  8:21 PM  Result Value Ref Range Status   Specimen Description BLOOD RIGHT ARM  Final   Special Requests BOTTLES DRAWN AEROBIC AND ANAEROBIC 5CC  Final   Culture NO GROWTH 4 DAYS  Final   Report Status PENDING  Incomplete     Labs: Basic Metabolic Panel:  Recent Labs Lab 04/03/15 0949 04/04/15 0508 04/05/15 0521 04/06/15 0428 04/07/15 0448  NA 127* 128* 127* 127* 129*  K 3.9 3.9 4.4 4.7 4.5  CL 96* 100* 97* 94* 95*  CO2 20* 23 24 22 22   GLUCOSE 248* 231* 223* 233* 215*  BUN 21* 23* 26* 22* 17  CREATININE 1.06* 1.33* 1.46* 1.30* 1.11*  CALCIUM 7.9* 7.8* 7.9* 8.1* 8.3*   Liver Function Tests:  Recent Labs Lab 04/01/15 0237 04/03/15 0949 04/04/15 0508 04/05/15 0521 04/07/15 0448  AST 25 25 21  29  22  ALT 12* 13* 12* 16 14  ALKPHOS 103 80 74 72 74  BILITOT 1.3* 1.1 0.8 0.8 0.7  PROT 5.9* 6.3* 6.3* 6.7 7.0  ALBUMIN 1.6* 1.4* 1.5* 1.5* 1.5*   No results for input(s): LIPASE, AMYLASE in the last 168 hours. No results for input(s): AMMONIA in the last 168 hours. CBC:  Recent Labs Lab 04/02/15 0345 04/03/15 0450 04/04/15 0508 04/05/15 0521 04/06/15 0428 04/07/15 0448  WBC 16.0* 15.0* 12.1* 12.1* 12.4* 12.1*  NEUTROABS 12.8*  --   --   --   --   --   HGB 7.3* 8.9* 8.5* 8.5* 8.1* 8.3*  HCT 22.0* 25.4* 24.1* 24.2* 23.7* 24.2*  MCV 82.4 80.9 81.4 82.3 82.6 84.3  PLT 174 169 196 250 329 439*   Cardiac Enzymes: No results for input(s): CKTOTAL, CKMB, CKMBINDEX, TROPONINI in the  last 168 hours. BNP: BNP (last 3 results) No results for input(s): BNP in the last 8760 hours.  ProBNP (last 3 results) No results for input(s): PROBNP in the last 8760 hours.  CBG:  Recent Labs Lab 04/06/15 1209 04/06/15 1729 04/06/15 2203 04/07/15 0807 04/07/15 1221  GLUCAP 202* 124* 169* 286* 217*    Time coordinating discharge: 45 minutes  Signed:  Saladin Petrelli  [facility] 04/07/2015, 3:12 PM

## 2015-04-08 ENCOUNTER — Emergency Department (HOSPITAL_COMMUNITY)
Admission: EM | Admit: 2015-04-08 | Discharge: 2015-04-09 | Disposition: A | Discharge status: Home / Self Care | Payer: Managed Care, Other (non HMO) | Attending: Emergency Medicine | Admitting: Emergency Medicine

## 2015-04-08 ENCOUNTER — Encounter (HOSPITAL_COMMUNITY): Payer: Self-pay | Admitting: Emergency Medicine

## 2015-04-08 DIAGNOSIS — R600 Localized edema: Secondary | ICD-10-CM

## 2015-04-08 DIAGNOSIS — R000 Tachycardia, unspecified: Secondary | ICD-10-CM | POA: Insufficient documentation

## 2015-04-08 DIAGNOSIS — M7989 Other specified soft tissue disorders: Secondary | ICD-10-CM | POA: Diagnosis present

## 2015-04-08 DIAGNOSIS — Z7984 Long term (current) use of oral hypoglycemic drugs: Secondary | ICD-10-CM | POA: Diagnosis not present

## 2015-04-08 DIAGNOSIS — R509 Fever, unspecified: Secondary | ICD-10-CM | POA: Diagnosis not present

## 2015-04-08 DIAGNOSIS — M7981 Nontraumatic hematoma of soft tissue: Secondary | ICD-10-CM | POA: Insufficient documentation

## 2015-04-08 DIAGNOSIS — I10 Essential (primary) hypertension: Secondary | ICD-10-CM | POA: Insufficient documentation

## 2015-04-08 DIAGNOSIS — E119 Type 2 diabetes mellitus without complications: Secondary | ICD-10-CM | POA: Diagnosis not present

## 2015-04-08 DIAGNOSIS — Z79899 Other long term (current) drug therapy: Secondary | ICD-10-CM | POA: Insufficient documentation

## 2015-04-08 DIAGNOSIS — T148XXA Other injury of unspecified body region, initial encounter: Secondary | ICD-10-CM

## 2015-04-08 LAB — CULTURE, BLOOD (ROUTINE X 2)
CULTURE: NO GROWTH
Culture: NO GROWTH

## 2015-04-08 LAB — CBC WITH DIFFERENTIAL/PLATELET
BASOS ABS: 0 10*3/uL (ref 0.0–0.1)
BASOS PCT: 0 %
EOS ABS: 0 10*3/uL (ref 0.0–0.7)
Eosinophils Relative: 0 %
HCT: 23.9 % — ABNORMAL LOW (ref 36.0–46.0)
HEMOGLOBIN: 7.9 g/dL — AB (ref 12.0–15.0)
LYMPHS PCT: 12 %
Lymphs Abs: 1.3 10*3/uL (ref 0.7–4.0)
MCH: 27.9 pg (ref 26.0–34.0)
MCHC: 33.1 g/dL (ref 30.0–36.0)
MCV: 84.5 fL (ref 78.0–100.0)
Monocytes Absolute: 0.1 10*3/uL (ref 0.1–1.0)
Monocytes Relative: 1 %
NEUTROS PCT: 87 %
Neutro Abs: 9.6 10*3/uL — ABNORMAL HIGH (ref 1.7–7.7)
Platelets: 530 10*3/uL — ABNORMAL HIGH (ref 150–400)
RBC: 2.83 MIL/uL — AB (ref 3.87–5.11)
RDW: 15.4 % (ref 11.5–15.5)
WBC: 11 10*3/uL — ABNORMAL HIGH (ref 4.0–10.5)

## 2015-04-08 LAB — HEPATIC FUNCTION PANEL
ALBUMIN: 1.6 g/dL — AB (ref 3.5–5.0)
ALK PHOS: 71 U/L (ref 38–126)
ALT: 14 U/L (ref 14–54)
AST: 21 U/L (ref 15–41)
BILIRUBIN TOTAL: 0.3 mg/dL (ref 0.3–1.2)
Bilirubin, Direct: <0.1 mg/dL — ABNORMAL LOW (ref 0.1–0.5)
TOTAL PROTEIN: 7.4 g/dL (ref 6.5–8.1)

## 2015-04-08 LAB — BASIC METABOLIC PANEL
ANION GAP: 12 (ref 5–15)
BUN: 15 mg/dL (ref 6–20)
CHLORIDE: 100 mmol/L — AB (ref 101–111)
CO2: 22 mmol/L (ref 22–32)
Calcium: 8.5 mg/dL — ABNORMAL LOW (ref 8.9–10.3)
Creatinine, Ser: 1.2 mg/dL — ABNORMAL HIGH (ref 0.44–1.00)
GFR calc non Af Amer: 52 mL/min — ABNORMAL LOW (ref >60)
Glucose, Bld: 220 mg/dL — ABNORMAL HIGH (ref 65–99)
POTASSIUM: 4.4 mmol/L (ref 3.5–5.1)
SODIUM: 134 mmol/L — AB (ref 135–145)

## 2015-04-08 LAB — BRAIN NATRIURETIC PEPTIDE: B Natriuretic Peptide: 259.3 pg/mL — ABNORMAL HIGH (ref 0.0–100.0)

## 2015-04-08 NOTE — ED Notes (Signed)
Ria in main lab states will add on BNP and Hep fx level to previous blood draw.

## 2015-04-08 NOTE — ED Provider Notes (Signed)
CSN: [place]:[id number]     Arrival date & time 04/08/15  1933 History   First MD Initiated Contact with Patient 04/08/15 2217     Chief Complaint  Patient presents with  . Leg Swelling     (Consider location/radiation/quality/duration/timing/severity/associated sxs/prior Treatment) Patient is a 50 y.o. female presenting with leg pain. The history is provided by the patient and the spouse.  Leg Pain Location:  Leg Injury: no   Leg location:  R lower leg Pain details:    Quality:  Throbbing   Radiates to:  Does not radiate   Severity:  Moderate   Onset quality:  Gradual   Duration:  1 day   Timing:  Constant   Progression:  Worsening Chronicity:  New Foreign body present:  No foreign bodies Prior injury to area:  No Relieved by:  Nothing Worsened by:  Activity Ineffective treatments:  None tried Associated symptoms: fever (intermittent fevers ever since being hospitalized) and swelling   Associated symptoms: no back pain     Past Medical History  Diagnosis Date  . Hypertension   . Diabetes mellitus without complication [facility])    Past Surgical History  Procedure Laterality Date  . Breast surgery    . Uterine fibroid surgery    . Left oophorectomy Left   . I&d extremity Right 03/30/2015    Procedure: IRRIGATION AND DEBRIDEMENT EXTREMITY;  Surgeon: Renette Butters, MD;  Location: [place];  Service: Orthopedics;  Laterality: Right;  . Shoulder arthroscopy Right 03/30/2015    Procedure: ARTHROSCOPY SHOULDER;  Surgeon: Renette Butters, MD;  Location: [place];  Service: Orthopedics;  Laterality: Right;   No family history on file. Social History  Substance Use Topics  . Smoking status: Never Smoker   . Smokeless tobacco: None  . Alcohol Use: No   OB History    No data available     Review of Systems  Constitutional: Positive for fever (intermittent fevers ever since being hospitalized).  HENT: Negative.   Eyes: Negative for visual disturbance.  Respiratory: Negative for  cough and shortness of breath.   Cardiovascular: Positive for leg swelling.  Gastrointestinal: Negative.   Genitourinary: Negative.   Musculoskeletal: Negative for back pain.  Skin: Negative.   Neurological: Negative.       Allergies  Review of patient's allergies indicates no known allergies.  Home Medications   Prior to Admission medications   Medication Sig Start Date End Date Taking? Authorizing Provider  acetaminophen (TYLENOL) 325 MG tablet Take 325 mg by mouth every 6 (six) hours as needed for mild pain or moderate pain.   Yes Historical Provider, MD  amLODipine (NORVASC) 10 MG tablet Take 1 tablet (10 mg total) by mouth daily. 04/07/15  Yes [name], MD  feeding supplement, GLUCERNA SHAKE, (GLUCERNA SHAKE) LIQD Take 237 mLs by mouth 3 (three) times daily between meals. 04/07/15  Yes [name], MD  ferrous sulfate 325 (65 FE) MG tablet Take 1 tablet (325 mg total) by mouth 2 (two) times daily with a meal. 04/07/15  Yes [name], MD  labetalol (NORMODYNE) 200 MG tablet Take 1 tablet (200 mg total) by mouth 3 (three) times daily. 04/07/15  Yes [name], MD  metFORMIN (GLUCOPHAGE) 500 MG tablet Take 500 mg by mouth 2 (two) times daily. 03/08/15  Yes Historical Provider, MD  oxyCODONE-acetaminophen (PERCOCET) 5-325 MG tablet Take 0.5-1 tablets by mouth every 4 (four) hours as needed. 04/09/15   Orpah Greek, MD  penicillin G potassium in  dextrose 5 % 500 mL Please infuse 24 million units over 24 hours continuously through 04/26/2015. Patient not taking: Reported on 04/08/2015 04/07/15   [name], MD  polyethylene glycol [facility] Floria Raveling) packet Take 17 g by mouth daily. Patient not taking: Reported on 04/08/2015 04/07/15   [name], MD  saccharomyces boulardii (FLORASTOR) 250 MG capsule Take 1 capsule (250 mg total) by mouth 2 (two) times daily. Patient not taking: Reported on 04/08/2015 04/07/15   [name], MD  senna-docusate  (SENOKOT-S) 8.6-50 MG tablet Take 2 tablets by mouth at bedtime as needed for mild constipation. Patient not taking: Reported on 04/08/2015 04/07/15   [name], MD   BP 151/85 mmHg  Pulse 114  Temp(Src) 99.7 F (37.6 C)  Resp 18  SpO2 99% Physical Exam  Constitutional: She is oriented to person, place, and time. She appears well-developed and well-nourished. No distress.  HENT:  Head: Normocephalic and atraumatic.  Mouth/Throat: No oropharyngeal exudate.  Eyes: Pupils are equal, round, and reactive to light. No scleral icterus.  Neck: Normal range of motion. Neck supple.  Cardiovascular: Regular rhythm, normal heart sounds and intact distal pulses.   tachycardic  Pulmonary/Chest: Effort normal and breath sounds normal. No respiratory distress. She has no wheezes. She has no rales.  Abdominal: Soft. She exhibits no distension. There is no tenderness. There is no rebound.  Musculoskeletal:  Bilateral LE pitting edema, worse on right. TTP along medial calf with warmth but without erythema  Neurological: She is alert and oriented to person, place, and time. No cranial nerve deficit. She exhibits normal muscle tone. Coordination normal.  Skin: Skin is warm and dry. No rash noted. She is not diaphoretic. No erythema. No pallor.  Psychiatric: She has a normal mood and affect.  Nursing note and vitals reviewed.   ED Course  Procedures (including critical care time) Labs Review Labs Reviewed  CBC WITH DIFFERENTIAL/PLATELET - Abnormal; Notable for the following:    WBC 11.0 (*)    RBC 2.83 (*)    Hemoglobin 7.9 (*)    HCT 23.9 (*)    Platelets 530 (*)    Neutro Abs 9.6 (*)    All other components within normal limits  BASIC METABOLIC PANEL - Abnormal; Notable for the following:    Sodium 134 (*)    Chloride 100 (*)    Glucose, Bld 220 (*)    Creatinine, Ser 1.20 (*)    Calcium 8.5 (*)    GFR calc non Af Amer 52 (*)    All other components within normal limits  HEPATIC  FUNCTION PANEL - Abnormal; Notable for the following:    Albumin 1.6 (*)    Bilirubin, Direct <0.1 (*)    All other components within normal limits  BRAIN NATRIURETIC PEPTIDE - Abnormal; Notable for the following:    B Natriuretic Peptide 259.3 (*)    All other components within normal limits  CULTURE, ROUTINE-ABSCESS    Imaging Review Dg Chest 2 View  04/09/2015  CLINICAL DATA:  Bilateral leg swelling with pain in the posterior leg since last night. PICC line. History of hypertension and diabetes. Recent shoulder surgery. EXAM: CHEST  2 VIEW COMPARISON:  03/31/2015 FINDINGS: Interval placement of a right central venous catheter with tip over the low SVC region. No pneumothorax. Mild cardiac enlargement with mild diffuse pulmonary vascular congestion. Interstitial changes in the lungs suggesting interstitial edema. Small bilateral pleural effusions with basilar atelectasis. Anterior inferior appearance of the humeral head with respect  to the glenoid fossa may be postoperative but could indicate anterior dislocation of the right shoulder. IMPRESSION: Right central venous catheter appears in satisfactory position. No pneumothorax. Pulmonary vascular congestion with interstitial edema, small bilateral pleural effusions, and basilar atelectasis. Possible anterior dislocation of the right shoulder. Electronically Signed   By: Lucienne Capers M.D.   On: 04/09/2015 02:06    I have personally reviewed and evaluated these images and lab results as part of my medical decision-making.   EKG Interpretation   Date/Time:  Thursday April 08 2015 22:44:53 EST Ventricular Rate:  114 PR Interval:  145 QRS Duration: 76 QT Interval:  315 QTC Calculation: 434 R Axis:   22 Text Interpretation:  Sinus tachycardia Low voltage, precordial leads No  significant change since last tracing Confirmed by Maryan Rued  MD, Loree Fee  [phone]) on 04/08/2015 11:25:32 PM      MDM   Final diagnoses:  Hematoma   ULTRASOUND LIMITED SOFT TISSUE/ MUSCULOSKELETAL: right calf Indication: swelling and pain c/f dvt Linear probe used to evaluate area of interest in two planes. Findings:  Large simple fluid collection along right calf but otherwise compressible veins Performed by: Dr. Lanetta Inch and Dr. Maryan Rued Images saved electronically  Patient is a 26yoF recently admitted for septic right shoulder and sepsis, and was discharged yesterday with a PICC line and on penicillin. She states her b/l LE have become more swollen since DC but came in today for concerns of pain in her right calf with worse swelling in right lower ext. Was on ppx blood thinners in hospital. Denies cp, sob, abdominal pain. Further history and exam as above. Tachycardic but otherwise HDS. Review of hosptial records reveal she was tachy upon discharge at similar rate. Has pain and swelling to right calf. Concern for DVT at this time. Cbc and bmp reassuring with improving leukocytosis. bnp mildly elevated. We are unable to obtain formal DVT US at night so I performed a bedside US which revealed a fluid collection along right calf right over area of maximal tenderness. Given concern for abscess since recent bacteremia will obtain ct LE to assess for fluid collection.  Pt care handed off to Dr. Betsey Holiday at [place]. F/u ct scan.    Heriberto Antigua, MD 04/09/15 [place], MD 04/10/15 1504

## 2015-04-08 NOTE — ED Notes (Signed)
Pt d/c yesterday from admission for bacteremia- sent home on home IV therapy. Pt sts since last night swelling in bilateral legs- worse on R side and pain in R calf. Denies sob, cp.

## 2015-04-09 ENCOUNTER — Other Ambulatory Visit (HOSPITAL_COMMUNITY): Payer: Self-pay | Admitting: Emergency Medicine

## 2015-04-09 ENCOUNTER — Emergency Department (HOSPITAL_COMMUNITY): Payer: Managed Care, Other (non HMO)

## 2015-04-09 ENCOUNTER — Encounter (HOSPITAL_COMMUNITY): Payer: Self-pay | Admitting: *Deleted

## 2015-04-09 ENCOUNTER — Emergency Department (HOSPITAL_COMMUNITY): Admission: RE | Admit: 2015-04-09 | Payer: Managed Care, Other (non HMO) | Source: Ambulatory Visit

## 2015-04-09 DIAGNOSIS — R609 Edema, unspecified: Secondary | ICD-10-CM

## 2015-04-09 MED ORDER — PENICILLIN G POTASSIUM 5000000 UNITS IJ SOLR
4.0000 10*6.[IU] | Freq: Once | INTRAMUSCULAR | Status: AC
Start: 2015-04-09 — End: 2015-04-09
  Administered 2015-04-09: 4 10*6.[IU] via INTRAVENOUS
  Filled 2015-04-09: qty 4

## 2015-04-09 MED ORDER — OXYCODONE-ACETAMINOPHEN 5-325 MG PO TABS: 0.5000 | ORAL_TABLET | ORAL | Status: DC | PRN

## 2015-04-09 MED ORDER — LIDOCAINE HCL 2 % IJ SOLN
10.0000 mL | Freq: Once | INTRAMUSCULAR | Status: AC
  Administered 2015-04-09: 200 mg
  Filled 2015-04-09: qty 20

## 2015-04-09 MED ORDER — HEPARIN SOD (PORK) LOCK FLUSH 100 UNIT/ML IV SOLN
250.0000 [IU] | INTRAVENOUS | Status: AC | PRN
  Administered 2015-04-09: 250 [IU]
  Filled 2015-04-09: qty 3

## 2015-04-09 MED ORDER — SODIUM CHLORIDE 0.9 % IV BOLUS (SEPSIS): 1000.0000 mL | Freq: Once | INTRAVENOUS | Status: DC

## 2015-04-09 MED ORDER — IOHEXOL 350 MG/ML SOLN
100.0000 mL | Freq: Once | INTRAVENOUS | Status: AC | PRN
  Administered 2015-04-09: 100 mL via INTRAVENOUS

## 2015-04-09 MED ORDER — OXYCODONE-ACETAMINOPHEN 5-325 MG PO TABS
1.0000 | ORAL_TABLET | Freq: Once | ORAL | Status: AC
  Administered 2015-04-09: 1 via ORAL
  Filled 2015-04-09: qty 1

## 2015-04-09 NOTE — ED Notes (Signed)
Dr. Betsey Holiday back in to speak with pt.

## 2015-04-09 NOTE — ED Provider Notes (Addendum)
Case signed out to me to follow-up on CT scan. Patient seen for pain and swelling of the right calf. DVT was considered. Bedside ultrasound, however, showed fluid collection in the proximal calf region. CT scan was therefore ordered. This did confirm a 2 x 4 cm fluid collection within the muscle of the proximal calf. It was not clear if this was a hematoma or abscess. Patient's white blood cell count has come down since admission. She is not febrile. Overlying skin is normal, no erythema, induration or fluctuance.  I did perform needle aspiration of the area under ultrasound guidance. No pus was obtained. I did visualize the needle entering the fluid cavity and only obtained thick, dark blood. Needle was repositioned and aspiration revealed blood-tinged serous fluid, less than a milliliter collected. This was sent for culture. This is suspected to be a hematoma, not infection.  I did discuss the findings with Dr. Percell Miller, her orthopedist. He did not feel that the patient would require hospitalization or broader spectrum antibiotic coverage at this time, based on the findings. He will see her in the office. Patient is comfortable watching the area home, return if there is any fever, redness, worsening swelling.  Results for orders placed or performed during the hospital encounter of 04/08/15  CBC with Differential  Result Value Ref Range   WBC 11.0 (H) 4.0 - 10.5 K/uL   RBC 2.83 (L) 3.87 - 5.11 MIL/uL   Hemoglobin 7.9 (L) 12.0 - 15.0 g/dL   HCT 23.9 (L) 36.0 - 46.0 %   MCV 84.5 78.0 - 100.0 fL   MCH 27.9 26.0 - 34.0 pg   MCHC 33.1 30.0 - 36.0 g/dL   RDW 15.4 11.5 - 15.5 %   Platelets 530 (H) 150 - 400 K/uL   Neutrophils Relative % 87 %   Lymphocytes Relative 12 %   Monocytes Relative 1 %   Eosinophils Relative 0 %   Basophils Relative 0 %   Neutro Abs 9.6 (H) 1.7 - 7.7 K/uL   Lymphs Abs 1.3 0.7 - 4.0 K/uL   Monocytes Absolute 0.1 0.1 - 1.0 K/uL   Eosinophils Absolute 0.0 0.0 - 0.7 K/uL    Basophils Absolute 0.0 0.0 - 0.1 K/uL   RBC Morphology POLYCHROMASIA PRESENT   Basic metabolic panel  Result Value Ref Range   Sodium 134 (L) 135 - 145 mmol/L   Potassium 4.4 3.5 - 5.1 mmol/L   Chloride 100 (L) 101 - 111 mmol/L   CO2 22 22 - 32 mmol/L   Glucose, Bld 220 (H) 65 - 99 mg/dL   BUN 15 6 - 20 mg/dL   Creatinine, Ser 1.20 (H) 0.44 - 1.00 mg/dL   Calcium 8.5 (L) 8.9 - 10.3 mg/dL   GFR calc non Af Amer 52 (L) >60 mL/min   GFR calc Af Amer >60 >60 mL/min   Anion gap 12 5 - 15  Hepatic function panel  Result Value Ref Range   Total Protein 7.4 6.5 - 8.1 g/dL   Albumin 1.6 (L) 3.5 - 5.0 g/dL   AST 21 15 - 41 U/L   ALT 14 14 - 54 U/L   Alkaline Phosphatase 71 38 - 126 U/L   Total Bilirubin 0.3 0.3 - 1.2 mg/dL   Bilirubin, Direct <0.1 (L) 0.1 - 0.5 mg/dL   Indirect Bilirubin NOT CALCULATED 0.3 - 0.9 mg/dL  Brain natriuretic peptide  Result Value Ref Range   B Natriuretic Peptide 259.3 (H) 0.0 - 100.0 pg/mL  Dg Chest 2 View  04/09/2015  CLINICAL DATA:  Bilateral leg swelling with pain in the posterior leg since last night. PICC line. History of hypertension and diabetes. Recent shoulder surgery. EXAM: CHEST  2 VIEW COMPARISON:  03/31/2015 FINDINGS: Interval placement of a right central venous catheter with tip over the low SVC region. No pneumothorax. Mild cardiac enlargement with mild diffuse pulmonary vascular congestion. Interstitial changes in the lungs suggesting interstitial edema. Small bilateral pleural effusions with basilar atelectasis. Anterior inferior appearance of the humeral head with respect to the glenoid fossa may be postoperative but could indicate anterior dislocation of the right shoulder. IMPRESSION: Right central venous catheter appears in satisfactory position. No pneumothorax. Pulmonary vascular congestion with interstitial edema, small bilateral pleural effusions, and basilar atelectasis. Possible anterior dislocation of the right shoulder. Electronically  Signed   By: Lucienne Capers M.D.   On: 04/09/2015 02:06   Mr Humerus Right Wo Contrast  04/06/2015  CLINICAL DATA:  Pain and swelling of the right upper extremity EXAM: MRI OF THE RIGHT HUMERUS WITHOUT CONTRAST TECHNIQUE: Multiplanar, multisequence MR imaging was performed. No intravenous contrast was administered. COMPARISON:  03/29/2015 FINDINGS: Again observed is a large glenohumeral joint effusion with edema tracking along adjacent muscle plans including the deltoid and rotator cuff musculature. The deltoid edema is most confluent anteriorly and there is a small amount of edema tracking along the superficial margin of the pectoralis major muscle. Edema tracks along the superficial fascia margin of the right rib cage. Fluid tracks along the superficial fascia margins of the biceps and triceps. There is muscular edema of the lateral head of the triceps and to a lesser extent medially in the brachialis. No significant elbow joint effusion although there is considerable subcutaneous edema along the elbow. I do not observe a drainable abscess. Edema tracking along the superficial fascia margins extends into the forearm. IMPRESSION: 1. There continues to be a right glenohumeral joint effusion which could be septic. Considerable edema in the surrounding rotator cuff and deltoid musculature. 2. Infiltrative edema also tracks along the margins (primarily the superficial fascia margins) of the pectoralis, biceps, and triceps musculature. There is no elbow joint effusion. Subcutaneous edema tracks into the forearm. I do not see a discrete drainable abscess. Potential myositis involving the rotator cuff musculature, anterior deltoid, and lateral head of the triceps. Electronically Signed   By: Van Clines M.D.   On: 04/06/2015 07:14   US Soft Tissue Head/neck  03/27/2015  CLINICAL DATA:  Left-sided neck pain EXAM: ULTRASOUND LEFT NECK TECHNIQUE: Longitudinal and transverse images of the left neck region in  the area of pain performed COMPARISON:  None. FINDINGS: Sonographic images of the left neck show no mass or inflammatory focus. No abnormal fluid collection. In particular, no abscess. Several tiny lymph nodes are noted in this area; by size criteria, there is no adenopathy in this area. Vascular structures of this area are widely patent. IMPRESSION: Scattered subcentimeter lymph nodes in the left neck, not meeting size criteria for pathologic significance. No mass or inflammatory focus appreciable. No abscess in particular. Electronically Signed   By: Lowella Grip III M.D.   On: 03/27/2015 17:19   [place] Chest  03/31/2015  CLINICAL DATA:  50 year old female with chest x-ray demonstrating right-sided small pleural effusion. EXAM: CHEST ULTRASOUND COMPARISON:  None. FINDINGS: Ultrasound survey of the right chest demonstrating small complex pleural effusion, not amenable to ultrasound-guided drainage. IMPRESSION: Small, complex appearing right pleural effusion not amenable for ultrasound-guided thoracentesis. Signed, York Cerise  Pasty Arch, DO [facility] Radiology Electronically Signed   By: [name] D.O.   On: 03/31/2015 11:05   US Abdomen Complete  03/27/2015  CLINICAL DATA:  Septic, generalized upper and lower abdominal pain, positive pregnancy test EXAM: ABDOMEN ULTRASOUND COMPLETE COMPARISON:  None FINDINGS: Gallbladder: Dependent echogenic material with minimal shadowing likely calculi in gallbladder. No gallbladder wall thickening, pericholecystic fluid, or sonographic Murphy sign. Common bile duct: Diameter: Normal caliber 4 mm diameter Liver: Normal appearance IVC: Normal appearance Pancreas: Distal tail obscured by bowel gas. Visualized portion normal appearance. Spleen: Normal appearance, 8.5 cm length Right Kidney: Length: 12.5 cm. Increased cortical echogenicity. Normal cortical thickness. No mass or hydronephrosis. Left Kidney: Length: 12.7 cm.  Increased cortical echogenicity. Normal cortical thickness. No mass or hydronephrosis. Abdominal aorta: Normal caliber Other findings: No free fluid IMPRESSION: Probable medical renal disease changes. Probable cholelithiasis without evidence acute cholecystitis. Remainder of exam unremarkable. Electronically Signed   By: Lavonia Dana M.D.   On: 03/27/2015 17:29   US Ob Comp Less 14 Wks  03/27/2015  CLINICAL DATA:  Low beta HCG of 27. Abdominal pain with sepsis. Elevated white blood cell count. EXAM: OBSTETRIC <14 WK [place] AND TRANSVAGINAL OB US TECHNIQUE: Both transabdominal and transvaginal ultrasound examinations were performed for complete evaluation of the gestation as well as the maternal uterus, adnexal regions, and pelvic cul-de-sac. Transvaginal technique was performed to assess early pregnancy. COMPARISON:  Ultrasound 02/13/2012 FINDINGS: Intrauterine gestational sac: None Yolk sac:  Not present Embryo:  Not present Maternal uterus/adnexae: The uterus is lobular and heterogeneous echotexture consistent multiple leiomyoma. This appears similar to the ultrasound of 2013. The endometrium is difficult to identified on the background of the multiple leiomyoma but measures 8 mm. Additionally, the ovaries are not identified. There is reported history of a LEFT oophorectomy in 2008. No free fluid. IMPRESSION: 1. Leiomyomatous uterus similar to 2013. 2. No decidual reaction or gestational sac identified. 3. No intrauterine gestational sac, yolk sac, or fetal pole identified. Differential considerations include intrauterine pregnancy too early to be sonographically visualized, missed abortion, or ectopic pregnancy. Followup ultrasound is recommended in 10-14 days for further evaluation. Electronically Signed   By: Suzy Bouchard M.D.   On: 03/27/2015 18:07   US Ob Transvaginal  03/27/2015  CLINICAL DATA:  Low beta HCG of 27. Abdominal pain with sepsis. Elevated white blood cell count. EXAM: OBSTETRIC <14 WK [place]  AND TRANSVAGINAL OB US TECHNIQUE: Both transabdominal and transvaginal ultrasound examinations were performed for complete evaluation of the gestation as well as the maternal uterus, adnexal regions, and pelvic cul-de-sac. Transvaginal technique was performed to assess early pregnancy. COMPARISON:  Ultrasound 02/13/2012 FINDINGS: Intrauterine gestational sac: None Yolk sac:  Not present Embryo:  Not present Maternal uterus/adnexae: The uterus is lobular and heterogeneous echotexture consistent multiple leiomyoma. This appears similar to the ultrasound of 2013. The endometrium is difficult to identified on the background of the multiple leiomyoma but measures 8 mm. Additionally, the ovaries are not identified. There is reported history of a LEFT oophorectomy in 2008. No free fluid. IMPRESSION: 1. Leiomyomatous uterus similar to 2013. 2. No decidual reaction or gestational sac identified. 3. No intrauterine gestational sac, yolk sac, or fetal pole identified. Differential considerations include intrauterine pregnancy too early to be sonographically visualized, missed abortion, or ectopic pregnancy. Followup ultrasound is recommended in 10-14 days for further evaluation. Electronically Signed   By: Suzy Bouchard M.D.   On: 03/27/2015 18:07   Ct Angio  Ao+bifem W/cm &/or Wo/cm  04/09/2015  CLINICAL DATA:  Increased swelling of the right calf and fever. Patient was just discharged for bacteremia. EXAM: CT ANGIOGRAPHY OF ABDOMINAL AORTA WITH ILIOFEMORAL RUNOFF TECHNIQUE: Multidetector CT imaging of the abdomen, pelvis and lower extremities was performed using the standard protocol during bolus administration of intravenous contrast. Multiplanar CT image reconstructions and MIPs were obtained to evaluate the vascular anatomy. CONTRAST:  17mL OMNIPAQUE IOHEXOL 350 MG/ML SOLN COMPARISON:  None. FINDINGS: Aorta: Normal caliber abdominal aorta. No evidence of aortic dissection. The abdominal aorta, celiac axis, superior  mesenteric artery, bilateral renal arteries, inferior mesenteric artery, and bilateral iliac, external iliac, internal iliac, and common femoral arteries are patent. Renal nephrograms are symmetrical. Right Lower Extremity: The right common femoral, deep femoral, superficial femoral, popliteal, tibial trunk, and tibial trifurcation vessels are patent to the right ankle. No evidence of significant vascular occlusion. Left Lower Extremity: The left common femoral, deep femoral, superficial femoral, popliteal, tibial trunk, and tibial trifurcation vessels are patent to the left ankle. No evidence of significant vascular occlusion. With Abdomen: Small bilateral pleural effusions with basilar atelectasis. Cholelithiasis with small stones in the gallbladder. No gallbladder wall thickening or bile duct dilatation. The liver, spleen, pancreas, adrenal glands, kidneys, inferior vena cava, and retroperitoneal lymph nodes are unremarkable. There is somewhat hazy infiltration in the retroperitoneal fat. This could represent retroperitoneal fibrosis or inflammatory changes. No discrete abscess. Retroperitoneal lymph nodes are likely reactive. Stomach, small bowel, and colon are decompressed. No free air or free fluid in the abdomen. Is edema in the subcutaneous fat. Pelvis: Diffuse enlargement of the uterus with multiple calcified fibroids. No abnormal adnexal masses. Appendix is normal. Small amount of free fluid in the pelvis. Bladder wall is not thickened. No pelvic lymphadenopathy. Degenerative changes in the lumbar spine. No destructive bone lesions. Lower extremities: Edema is demonstrated in the subcutaneous fat of the lower legs bilaterally. This is greater on the right. In the posterior compartment musculature of the right calf beginning at the level of the knee, there is a heterogeneous collection measuring 1.7 x 4.2 cm. This could represent abscess or hematoma. Consider ultrasound for further evaluation if clinically  indicated. Small right popliteal cyst. Small left popliteal cyst. Bones appear intact. No evidence of acute fracture or bone erosion. No evidence of osteomyelitis. Review of the MIP images confirms the above findings. IMPRESSION: Abdominal aorta and runoff vessels are patent bilaterally to the ankles. No evidence of vascular stenosis or occlusion. Small bilateral pleural effusions with basilar atelectasis in the lungs. Diffuse edema throughout the subcutaneous fat of the abdomen and of the lower legs, greater on the right. Heterogeneous fluid collection in the posterior compartment of the right lower leg beginning at the level of the knee suggesting hematoma or abscess. Cholelithiasis. Hazy infiltration of the retroperitoneal fat suggesting infectious/ inflammatory process versus retroperitoneal fibrosis. Electronically Signed   By: Lucienne Capers M.D.   On: 04/09/2015 02:55   Mr Shoulder Right Wo Contrast  03/30/2015  CLINICAL DATA:  Joint pain most severe in the right shoulder. Sepsis. EXAM: MRI OF THE RIGHT SHOULDER WITHOUT CONTRAST TECHNIQUE: Multiplanar, multisequence MR imaging of the shoulder was performed. No intravenous contrast was administered. COMPARISON:  03/27/2015 FINDINGS: Rotator cuff: Prominent partial thickness articular surface tearing of the supraspinatus, with the articular surface portion retracted 1.7 cm on image 16 series 5, compatible with tendon delamination. Moderate tendinopathy of the remaining supraspinatus tendon. Similarly there is partial thickness articular surface tearing of the infraspinatus  tendon with moderate infraspinatus tendinopathy. Moderate subscapularis tendinopathy is present along with potential partial thickness articular surface tearing Muscles: Abnormal edema tracks primarily along the marginal portions of the supraspinatus, subscapularis, teres minor, and teres major muscles. There is also low-level edema along the superficial fascia margin of the lateral  deltoid. Biceps long head: Partial tearing or advanced tendinopathy of the intra-articular segment. Acromioclavicular Joint: Mild degenerative AC joint arthropathy. Subacromial morphology is type 2 (curved). Small but abnormal amount of fluid in the subacromial subdeltoid bursa. Glenohumeral Joint: Prominent glenohumeral joint effusion. Moderate degenerative chondral thinning along the glenohumeral joint. Labrum: Linear irregularity in the superior labrum with a blunted appearance, compatible with SLAP tear extending into the biceps anchor. Bones: Unusual flaring of the proximal humeral metadiaphysis, query prior fracture. Small right axillary lymph nodes are observed. IMPRESSION: 1. Large shoulder joint effusion with abnormal edema tracking within and along the margins of the regional musculature. In the setting of sepsis and new onset shoulder pain, there is a high degree of concern for septic glenohumeral joint, and arthrocentesis is likely indicated. 2. Partial thickness articular surface tearing of the supraspinatus and infraspinatus with tendon delamination. There is potentially mild partial thickness articular surface tearing of the subscapularis. Considerable tendinopathy of these tendons as well. 3. Advanced tendinopathy or partial tearing of the long head of the biceps. 4. Increased signal in the superior labrum compatible with SLAP tear extending into the biceps anchor. 5. Subacromial subdeltoid bursitis. 6. Somewhat unusual flaring of the proximal humeral metadiaphysis, query prior fracture. 7. Moderate degenerative glenohumeral arthropathy. These results will be called to the ordering clinician or representative by the Radiologist Assistant, and communication documented in the PACS or zVision Dashboard. Electronically Signed   By: Van Clines M.D.   On: 03/30/2015 07:28   Ir Fluoro Guide Cv Line Right  04/01/2015  CLINICAL DATA:  50 year old female with a history of septic arthritis. She has  been referred for tunneled catheter placement for antibiotics. EXAM: IR RIGHT FLOURO GUIDE CV LINE; IR ULTRASOUND GUIDANCE VASC ACCESS RIGHT Date: 04/01/2015 ANESTHESIA/SEDATION: None Total sedation time: 0 minutes FLUOROSCOPY TIME:  30 second TECHNIQUE: The procedure, risks, benefits, and alternatives were explained to the patient. Questions regarding the procedure were encouraged and answered. The patient understands and consents to the procedure. The right neck and chest was prepped with chlorhexidine, and draped in the usual sterile fashion using maximum barrier technique (cap and mask, sterile gown, sterile gloves, large sterile sheet, hand hygiene and cutaneous antiseptic). Local anesthesia was attained by infiltration with 1% lidocaine without epinephrine. Ultrasound demonstrated patency of the right internal jugular vein, and this was documented with an image. Under real-time ultrasound guidance, this vein was accessed with a 21 gauge micropuncture needle and image documentation was performed. A small dermatotomy was made at the access site with an 11 scalpel. A 0.018" wire was advanced into the SVC and the access needle exchanged for a 7F micropuncture vascular sheath. The 0.018" wire was then removed and a 0.035" wire advanced into the IVC. Peel-away sheath was placed over the wire, and upon removal of the wire appropriate internal length was obtained/measured. Skin and subcutaneous tissues on the right chest wall just inferior to the clavicle were infiltrated with 1% lidocaine for local anesthesia. Small stab incision was made with 11 blade scalpel. The catheter was back tunneled to the incision at the neck, with the catheter pulled through the subcutaneous tunnel. Catheter was then ligated at the appropriate length at the neck  and passed through the peel-away sheath. Peel-away sheath was removed. Final image was stored. Catheter was then sutured onto the right chest wall with retention sutures. The  dermatotomy at the venous access site was also closed with Dermabond. Patient tolerated the procedure well and remained hemodynamically stable throughout. No complications were encountered and no significant blood loss was encountered. COMPLICATIONS: None IMPRESSION: Status post placement of right IJ tunneled catheter measuring 24 centimeter. Catheter ready for use. Signed, Dulcy Fanny. Earleen Newport, DO [facility] Radiology Electronically Signed   By: [name] D.O.   On: 04/01/2015 14:45   Ir US Guide Vasc Access Right  04/01/2015  CLINICAL DATA:  50 year old female with a history of septic arthritis. She has been referred for tunneled catheter placement for antibiotics. EXAM: IR RIGHT FLOURO GUIDE CV LINE; IR ULTRASOUND GUIDANCE VASC ACCESS RIGHT Date: 04/01/2015 ANESTHESIA/SEDATION: None Total sedation time: 0 minutes FLUOROSCOPY TIME:  30 second TECHNIQUE: The procedure, risks, benefits, and alternatives were explained to the patient. Questions regarding the procedure were encouraged and answered. The patient understands and consents to the procedure. The right neck and chest was prepped with chlorhexidine, and draped in the usual sterile fashion using maximum barrier technique (cap and mask, sterile gown, sterile gloves, large sterile sheet, hand hygiene and cutaneous antiseptic). Local anesthesia was attained by infiltration with 1% lidocaine without epinephrine. Ultrasound demonstrated patency of the right internal jugular vein, and this was documented with an image. Under real-time ultrasound guidance, this vein was accessed with a 21 gauge micropuncture needle and image documentation was performed. A small dermatotomy was made at the access site with an 11 scalpel. A 0.018" wire was advanced into the SVC and the access needle exchanged for a 55F micropuncture vascular sheath. The 0.018" wire was then removed and a 0.035" wire advanced into the IVC. Peel-away  sheath was placed over the wire, and upon removal of the wire appropriate internal length was obtained/measured. Skin and subcutaneous tissues on the right chest wall just inferior to the clavicle were infiltrated with 1% lidocaine for local anesthesia. Small stab incision was made with 11 blade scalpel. The catheter was back tunneled to the incision at the neck, with the catheter pulled through the subcutaneous tunnel. Catheter was then ligated at the appropriate length at the neck and passed through the peel-away sheath. Peel-away sheath was removed. Final image was stored. Catheter was then sutured onto the right chest wall with retention sutures. The dermatotomy at the venous access site was also closed with Dermabond. Patient tolerated the procedure well and remained hemodynamically stable throughout. No complications were encountered and no significant blood loss was encountered. COMPLICATIONS: None IMPRESSION: Status post placement of right IJ tunneled catheter measuring 24 centimeter. Catheter ready for use. Signed, Dulcy Fanny. Earleen Newport, DO [facility] Radiology Electronically Signed   By: [name] D.O.   On: 04/01/2015 14:45   Dg Chest Port 1 View  03/31/2015  CLINICAL DATA:  Acute onset of shortness of breath. Initial encounter. EXAM: PORTABLE CHEST 1 VIEW COMPARISON:  Chest radiograph from 03/27/2015 FINDINGS: A small to moderate right-sided pleural effusion is noted. Right basilar airspace opacity may reflect atelectasis or pneumonia. Asymmetric interstitial edema could have a similar appearance. Underlying vascular congestion is seen. No pneumothorax is identified. The cardiomediastinal silhouette is borderline enlarged. No acute osseous abnormalities are seen. IMPRESSION: 1. Small to moderate right-sided pleural effusion noted. Right basilar airspace opacity may reflect atelectasis or pneumonia. Asymmetric  interstitial edema could have a similar  appearance. 2. Underlying vascular congestion and borderline cardiomegaly. Electronically Signed   By: Garald Balding M.D.   On: 03/31/2015 01:49   Dg Chest Port 1 View  03/27/2015  CLINICAL DATA:  Fever for 3 days EXAM: PORTABLE CHEST 1 VIEW COMPARISON:  None. FINDINGS: Lungs are clear. Heart is upper normal in size with pulmonary vascularity within normal limits. No adenopathy. No bone lesions. IMPRESSION: No edema or consolidation. Electronically Signed   By: Lowella Grip III M.D.   On: 03/27/2015 15:43   Dg Fluoro Guided Needle Plc Aspiration/injection Loc  03/30/2015  CLINICAL DATA:  Prior MRI suspicious for septic glenohumeral joint. Sepsis. EXAM: FLUORO GUIDED NEEDLE PLACEMENT FLUOROSCOPY TIME:  If the device does not provide the exposure index: Fluoroscopy Time (in minutes and seconds):  1 minutes and 42 seconds Number of Acquired Images:  None COMPARISON:  MRI of 03/29/2015. FINDINGS: Informed written and verbal consent were obtained. Risks and benefits of the procedure were discussed. A "Time out" was performed. The superior medial portion of the right humeral head was localized under fluoroscopic guidance. Skin was prepped and draped in standard sterile fashion. Skin was numbed with 1% lidocaine. A 20 gauge needle was inserted into the joint. No fluid could be aspirated. Intra-articular location was confirmed with approximately 6 cc of Omnipaque 180. Subsequent, approximately 5 cc of saline were injected. Only minimal fluid could be returned. IMPRESSION: Uncomplicated aspiration of the right shoulder, as detailed above. Minimal fluid was obtained, and sent to laboratory as requested. Electronically Signed   By: Abigail Miyamoto M.D.   On: 03/30/2015 12:11    Apiration of blood/fluid Performed by: Orpah Greek. Consent obtained. Required items: required blood products, implants, devices, and special equipment available Patient identity confirmed: verbally with patient Time out:  Immediately prior to procedure a "time out" was called to verify the correct patient, procedure, equipment, support staff and site/side marked as required. Preparation: Patient was prepped and draped in the usual sterile fashion. Patient tolerance: Patient tolerated the procedure well with no immediate complications.  Location of aspiration: right medial calf  Area was sterilely prepped and then fluid collection was identified with ultrasound. The area over the region was anesthetized with lidocaine 2%. An 18-gauge needle was then passed through the anesthetized skin and watched as it was advanced into the fluid collection on ultrasound. Constant aspiration was performed. Only a small amount of dark blood and some serous fluid was obtained, no pus encountered.      Orpah Greek, MD 04/09/15 [place], MD 04/09/15 [phone]

## 2015-04-09 NOTE — Discharge Instructions (Signed)
Hematoma  A hematoma is a collection of blood under the skin, in an organ, in a body space, in a joint space, or in other tissue. The blood can clot to form a lump that you can see and feel. The lump is often firm and may sometimes become sore and tender. Most hematomas get better in a few days to weeks. However, some hematomas may be serious and require medical care. Hematomas can range in size from very small to very large.  CAUSES   A hematoma can be caused by a blunt or penetrating injury. It can also be caused by spontaneous leakage from a blood vessel under the skin. Spontaneous leakage from a blood vessel is more likely to occur in older people, especially those taking blood thinners. Sometimes, a hematoma can develop after certain medical procedures.  SIGNS AND SYMPTOMS   · A firm lump on the body.  · Possible pain and tenderness in the area.  · Bruising. Blue, dark blue, purple-red, or yellowish skin may appear at the site of the hematoma if the hematoma is close to the surface of the skin.  For hematomas in deeper tissues or body spaces, the signs and symptoms may be subtle. For example, an intra-abdominal hematoma may cause abdominal pain, weakness, fainting, and shortness of breath. An intracranial hematoma may cause a headache or symptoms such as weakness, trouble speaking, or a change in consciousness.  DIAGNOSIS   A hematoma can usually be diagnosed based on your medical history and a physical exam. Imaging tests may be needed if your health care provider suspects a hematoma in deeper tissues or body spaces, such as the abdomen, head, or chest. These tests may include ultrasonography or a CT scan.   TREATMENT   Hematomas usually go away on their own over time. Rarely does the blood need to be drained out of the body. Large hematomas or those that may affect vital organs will sometimes need surgical drainage or monitoring.  HOME CARE INSTRUCTIONS   · Apply ice to the injured area:      Put ice in a  plastic bag.      Place a towel between your skin and the bag.      Leave the ice on for 20 minutes, 2-3 times a day for the first 1 to 2 days.    · After the first 2 days, switch to using warm compresses on the hematoma.    · Elevate the injured area to help decrease pain and swelling. Wrapping the area with an elastic bandage may also be helpful. Compression helps to reduce swelling and promotes shrinking of the hematoma. Make sure the bandage is not wrapped too tight.    · If your hematoma is on a lower extremity and is painful, crutches may be helpful for a couple days.    · Only take over-the-counter or prescription medicines as directed by your health care provider.  SEEK IMMEDIATE MEDICAL CARE IF:   · You have increasing pain, or your pain is not controlled with medicine.    · You have a fever.    · You have worsening swelling or discoloration.    · Your skin over the hematoma breaks or starts bleeding.    · Your hematoma is in your chest or abdomen and you have weakness, shortness of breath, or a change in consciousness.  · Your hematoma is on your scalp (caused by a fall or injury) and you have a worsening headache or a change in alertness or consciousness.  MAKE SURE YOU:   ·   Understand these instructions.  · Will watch your condition.  · Will get help right away if you are not doing well or get worse.     This information is not intended to replace advice given to you by your health care provider. Make sure you discuss any questions you have with your health care provider.     Document Released: 10/12/2003 Document Revised: 10/30/2012 Document Reviewed: 08/07/2012  Elsevier Interactive Patient Education ©2016 [employer]

## 2015-04-09 NOTE — ED Notes (Signed)
Dr. Pollina back at the bedside.  

## 2015-04-09 NOTE — ED Notes (Signed)
Purple lumen flushed with 10 ml NS and 250 units Heparin. Red lumen unable to draw back or flush. Informed pt to make her home health RN aware today if she comes out. May need to be sent back and have TPA placed in lumen.

## 2015-04-11 LAB — CULTURE, ROUTINE-ABSCESS: CULTURE: NO GROWTH

## 2015-04-12 ENCOUNTER — Inpatient Hospital Stay (HOSPITAL_COMMUNITY)
Admission: EM | Admit: 2015-04-12 | Discharge: 2015-04-17 | DRG: 292 | Disposition: A | Discharge status: Home Health | Payer: Managed Care, Other (non HMO) | Attending: Internal Medicine | Admitting: Internal Medicine

## 2015-04-12 ENCOUNTER — Emergency Department (HOSPITAL_COMMUNITY): Payer: Managed Care, Other (non HMO)

## 2015-04-12 DIAGNOSIS — R7881 Bacteremia: Secondary | ICD-10-CM

## 2015-04-12 DIAGNOSIS — B955 Unspecified streptococcus as the cause of diseases classified elsewhere: Secondary | ICD-10-CM | POA: Diagnosis present

## 2015-04-12 DIAGNOSIS — D638 Anemia in other chronic diseases classified elsewhere: Secondary | ICD-10-CM | POA: Diagnosis present

## 2015-04-12 DIAGNOSIS — K644 Residual hemorrhoidal skin tags: Secondary | ICD-10-CM | POA: Diagnosis present

## 2015-04-12 DIAGNOSIS — E611 Iron deficiency: Secondary | ICD-10-CM | POA: Diagnosis present

## 2015-04-12 DIAGNOSIS — I5031 Acute diastolic (congestive) heart failure: Secondary | ICD-10-CM | POA: Diagnosis present

## 2015-04-12 DIAGNOSIS — R7989 Other specified abnormal findings of blood chemistry: Secondary | ICD-10-CM

## 2015-04-12 DIAGNOSIS — K648 Other hemorrhoids: Secondary | ICD-10-CM | POA: Diagnosis present

## 2015-04-12 DIAGNOSIS — J810 Acute pulmonary edema: Secondary | ICD-10-CM | POA: Insufficient documentation

## 2015-04-12 DIAGNOSIS — Z7984 Long term (current) use of oral hypoglycemic drugs: Secondary | ICD-10-CM

## 2015-04-12 DIAGNOSIS — E119 Type 2 diabetes mellitus without complications: Secondary | ICD-10-CM

## 2015-04-12 DIAGNOSIS — I5033 Acute on chronic diastolic (congestive) heart failure: Secondary | ICD-10-CM | POA: Insufficient documentation

## 2015-04-12 DIAGNOSIS — M7121 Synovial cyst of popliteal space [Baker], right knee: Secondary | ICD-10-CM | POA: Diagnosis present

## 2015-04-12 DIAGNOSIS — R609 Edema, unspecified: Secondary | ICD-10-CM

## 2015-04-12 DIAGNOSIS — M7122 Synovial cyst of popliteal space [Baker], left knee: Secondary | ICD-10-CM | POA: Diagnosis present

## 2015-04-12 DIAGNOSIS — R0602 Shortness of breath: Secondary | ICD-10-CM | POA: Diagnosis not present

## 2015-04-12 DIAGNOSIS — D62 Acute posthemorrhagic anemia: Secondary | ICD-10-CM | POA: Diagnosis present

## 2015-04-12 DIAGNOSIS — I110 Hypertensive heart disease with heart failure: Secondary | ICD-10-CM | POA: Diagnosis not present

## 2015-04-12 DIAGNOSIS — K449 Diaphragmatic hernia without obstruction or gangrene: Secondary | ICD-10-CM | POA: Diagnosis present

## 2015-04-12 DIAGNOSIS — I10 Essential (primary) hypertension: Secondary | ICD-10-CM | POA: Diagnosis present

## 2015-04-12 DIAGNOSIS — R000 Tachycardia, unspecified: Secondary | ICD-10-CM | POA: Diagnosis present

## 2015-04-12 DIAGNOSIS — I509 Heart failure, unspecified: Secondary | ICD-10-CM

## 2015-04-12 DIAGNOSIS — D649 Anemia, unspecified: Secondary | ICD-10-CM

## 2015-04-12 DIAGNOSIS — J811 Chronic pulmonary edema: Secondary | ICD-10-CM | POA: Diagnosis present

## 2015-04-12 DIAGNOSIS — R52 Pain, unspecified: Secondary | ICD-10-CM

## 2015-04-12 DIAGNOSIS — D123 Benign neoplasm of transverse colon: Secondary | ICD-10-CM | POA: Diagnosis present

## 2015-04-12 HISTORY — DX: Anemia, unspecified: D64.9

## 2015-04-12 HISTORY — DX: Reserved for inherently not codable concepts without codable children: IMO0001

## 2015-04-12 LAB — COMPREHENSIVE METABOLIC PANEL
ALBUMIN: 1.8 g/dL — AB (ref 3.5–5.0)
ALT: 14 U/L (ref 14–54)
ANION GAP: 11 (ref 5–15)
AST: 20 U/L (ref 15–41)
Alkaline Phosphatase: 80 U/L (ref 38–126)
BUN: 11 mg/dL (ref 6–20)
CO2: 22 mmol/L (ref 22–32)
Calcium: 8.7 mg/dL — ABNORMAL LOW (ref 8.9–10.3)
Chloride: 104 mmol/L (ref 101–111)
Creatinine, Ser: 1.01 mg/dL — ABNORMAL HIGH (ref 0.44–1.00)
GFR calc Af Amer: >60 mL/min (ref >60)
GFR calc non Af Amer: >60 mL/min (ref >60)
GLUCOSE: 267 mg/dL — AB (ref 65–99)
POTASSIUM: 3.9 mmol/L (ref 3.5–5.1)
SODIUM: 137 mmol/L (ref 135–145)
Total Bilirubin: 0.6 mg/dL (ref 0.3–1.2)
Total Protein: 7.6 g/dL (ref 6.5–8.1)

## 2015-04-12 LAB — I-STAT TROPONIN, ED: TROPONIN I, POC: 0 ng/mL (ref 0.00–0.08)

## 2015-04-12 LAB — CBC WITH DIFFERENTIAL/PLATELET
BASOS ABS: 0.1 10*3/uL (ref 0.0–0.1)
BASOS PCT: 1 %
EOS ABS: 0.1 10*3/uL (ref 0.0–0.7)
Eosinophils Relative: 1 %
HEMATOCRIT: 21.6 % — AB (ref 36.0–46.0)
HEMOGLOBIN: 7 g/dL — AB (ref 12.0–15.0)
Lymphocytes Relative: 13 %
Lymphs Abs: 1.3 10*3/uL (ref 0.7–4.0)
MCH: 27.5 pg (ref 26.0–34.0)
MCHC: 32.4 g/dL (ref 30.0–36.0)
MCV: 84.7 fL (ref 78.0–100.0)
MONO ABS: 0.8 10*3/uL (ref 0.1–1.0)
MONOS PCT: 8 %
NEUTROS ABS: 7.6 10*3/uL (ref 1.7–7.7)
NEUTROS PCT: 77 %
Platelets: 474 10*3/uL — ABNORMAL HIGH (ref 150–400)
RBC: 2.55 MIL/uL — ABNORMAL LOW (ref 3.87–5.11)
RDW: 15.7 % — AB (ref 11.5–15.5)
WBC: 9.9 10*3/uL (ref 4.0–10.5)

## 2015-04-12 LAB — I-STAT CG4 LACTIC ACID, ED: Lactic Acid, Venous: 0.75 mmol/L (ref 0.5–2.0)

## 2015-04-12 LAB — URINALYSIS, ROUTINE W REFLEX MICROSCOPIC
BILIRUBIN URINE: NEGATIVE
Glucose, UA: 250 mg/dL — AB
KETONES UR: NEGATIVE mg/dL
Leukocytes, UA: NEGATIVE
Nitrite: NEGATIVE
PROTEIN: NEGATIVE mg/dL
SPECIFIC GRAVITY, URINE: 1.007 (ref 1.005–1.030)
pH: 6.5 (ref 5.0–8.0)

## 2015-04-12 LAB — URINE MICROSCOPIC-ADD ON

## 2015-04-12 MED ORDER — FUROSEMIDE 10 MG/ML IJ SOLN
40.0000 mg | INTRAMUSCULAR | Status: AC
  Administered 2015-04-12: 40 mg via INTRAVENOUS
  Filled 2015-04-12: qty 4

## 2015-04-12 MED ORDER — SODIUM CHLORIDE 0.9 % IV SOLN: Freq: Once | INTRAVENOUS | Status: DC

## 2015-04-12 MED ORDER — SODIUM CHLORIDE 0.9 % IV BOLUS (SEPSIS): 1000.0000 mL | Freq: Once | INTRAVENOUS | Status: DC

## 2015-04-12 NOTE — ED Notes (Signed)
Bedside commode placed beside pt's bed and call light in reach. Pt instructed to use call light when she has to urinate.

## 2015-04-12 NOTE — ED Provider Notes (Signed)
CSN: [id number]     Arrival date & time 04/12/15  2120 History   First MD Initiated Contact with Patient 04/12/15 2141     Chief Complaint  Patient presents with  . Shortness of Breath     (Consider location/radiation/quality/duration/timing/severity/associated sxs/prior Treatment) HPI Comments: The patient is a 50 year old female, she has a recent medical history of a streptococcal bacteremia followed by a septic arthritis of the right shoulder which required operative washout and postoperative antibiotics through a central catheter in her right upper chest. The patient has had some difficulty over the last couple of weeks since the original problem with intermittent fevers as well as with some shortness of breath which has become acutely worse over the last 2 days. Of note the patient was seen recently in the emergency department and had a workup including a CT angiogram of her aorta and the runoff vessels showing no signs of obstruction or stricture, chest x-ray which showed no signs of pleural effusions or infections however the CT scan did show that she had a small amount of bilateral pleural effusions. Since that time the patient's symptoms have worsened, she now reports having low-grade fevers, mild cough today but mostly she has severe dyspnea on exertion. Her symptoms are persistent and gradually worsening and are now severe. She also reports ongoing swelling in her bilateral legs especially the right side.  Patient is a 50 y.o. female presenting with shortness of breath. The history is provided by the patient.  Shortness of Breath   Past Medical History  Diagnosis Date  . Hypertension   . Diabetes mellitus without complication [facility])    Past Surgical History  Procedure Laterality Date  . Breast surgery    . Uterine fibroid surgery    . Left oophorectomy Left   . I&d extremity Right 03/30/2015    Procedure: IRRIGATION AND DEBRIDEMENT EXTREMITY;  Surgeon: Renette Butters, MD;   Location: [place];  Service: Orthopedics;  Laterality: Right;  . Shoulder arthroscopy Right 03/30/2015    Procedure: ARTHROSCOPY SHOULDER;  Surgeon: Renette Butters, MD;  Location: [place];  Service: Orthopedics;  Laterality: Right;   History reviewed. No pertinent family history. Social History  Substance Use Topics  . Smoking status: Never Smoker   . Smokeless tobacco: None  . Alcohol Use: No   OB History    No data available     Review of Systems  Respiratory: Positive for shortness of breath.   All other systems reviewed and are negative.     Allergies  Review of patient's allergies indicates no known allergies.  Home Medications   Prior to Admission medications   Medication Sig Start Date End Date Taking? Authorizing Provider  acetaminophen (TYLENOL) 325 MG tablet Take 325 mg by mouth every 6 (six) hours as needed for mild pain or moderate pain.   Yes Historical Provider, MD  amLODipine (NORVASC) 10 MG tablet Take 1 tablet (10 mg total) by mouth daily. 04/07/15  Yes [name], MD  feeding supplement, GLUCERNA SHAKE, (GLUCERNA SHAKE) LIQD Take 237 mLs by mouth 3 (three) times daily between meals. 04/07/15  Yes [name], MD  ferrous sulfate 325 (65 FE) MG tablet Take 1 tablet (325 mg total) by mouth 2 (two) times daily with a meal. 04/07/15  Yes [name], MD  labetalol (NORMODYNE) 200 MG tablet Take 1 tablet (200 mg total) by mouth 3 (three) times daily. 04/07/15  Yes [name], MD  metFORMIN (GLUCOPHAGE) 500 MG tablet Take 500  mg by mouth 2 (two) times daily. 03/08/15  Yes Historical Provider, MD  oxyCODONE-acetaminophen (PERCOCET) 5-325 MG tablet Take 0.5-1 tablets by mouth every 4 (four) hours as needed. 04/09/15  Yes Orpah Greek, MD  penicillin G potassium in dextrose 5 % 500 mL Please infuse 24 million units over 24 hours continuously through 04/26/2015. 04/07/15  Yes [name], MD  furosemide (LASIX) 20 MG tablet Take 20 mg by mouth daily.  04/12/15   Historical Provider, MD  polyethylene glycol (MIRALAX / GLYCOLAX) packet Take 17 g by mouth daily. Patient not taking: Reported on 04/08/2015 04/07/15   [name], MD  saccharomyces boulardii (FLORASTOR) 250 MG capsule Take 1 capsule (250 mg total) by mouth 2 (two) times daily. Patient not taking: Reported on 04/08/2015 04/07/15   [name], MD  senna-docusate (SENOKOT-S) 8.6-50 MG tablet Take 2 tablets by mouth at bedtime as needed for mild constipation. Patient not taking: Reported on 04/08/2015 04/07/15   [name], MD   BP 166/91 mmHg  Pulse 112  Temp(Src) 99.1 F (37.3 C) (Oral)  Resp 18  Ht 5\' 6"  (1.676 m)  Wt 183 lb 9.6 oz (83.28 kg)  BMI 29.65 kg/m2  SpO2 99% Physical Exam  Constitutional: She appears well-developed and well-nourished. She appears distressed.  HENT:  Head: Normocephalic and atraumatic.  Mouth/Throat: Oropharynx is clear and moist. No oropharyngeal exudate.  Eyes: Conjunctivae and EOM are normal. Pupils are equal, round, and reactive to light. Right eye exhibits no discharge. Left eye exhibits no discharge. No scleral icterus.  Neck: Normal range of motion. Neck supple. No JVD present. No thyromegaly present.  Cardiovascular: Regular rhythm, normal heart sounds and intact distal pulses.  Exam reveals no gallop and no friction rub.   No murmur heard. tachycardia  Pulmonary/Chest: She is in respiratory distress. She has no wheezes. She has rales.  Abdominal: Soft. Bowel sounds are normal. She exhibits no distension and no mass. There is no tenderness.  Musculoskeletal: Normal range of motion. She exhibits edema. She exhibits no tenderness.  Lymphadenopathy:    She has no cervical adenopathy.  Neurological: She is alert. Coordination normal.  Skin: Skin is warm and dry. No rash noted. No erythema.  Psychiatric: She has a normal mood and affect. Her behavior is normal.  Nursing note and vitals reviewed.   ED Course  Procedures  (including critical care time) Labs Review Labs Reviewed  CBC WITH DIFFERENTIAL/PLATELET - Abnormal; Notable for the following:    RBC 2.55 (*)    Hemoglobin 7.0 (*)    HCT 21.6 (*)    RDW 15.7 (*)    Platelets 474 (*)    All other components within normal limits  COMPREHENSIVE METABOLIC PANEL - Abnormal; Notable for the following:    Glucose, Bld 267 (*)    Creatinine, Ser 1.01 (*)    Calcium 8.7 (*)    Albumin 1.8 (*)    All other components within normal limits  BRAIN NATRIURETIC PEPTIDE - Abnormal; Notable for the following:    B Natriuretic Peptide 308.7 (*)    All other components within normal limits  URINALYSIS, ROUTINE W REFLEX MICROSCOPIC (NOT AT [facility]) - Abnormal; Notable for the following:    Color, Urine STRAW (*)    APPearance CLOUDY (*)    Glucose, UA 250 (*)    Hgb urine dipstick LARGE (*)    All other components within normal limits  URINE MICROSCOPIC-ADD ON - Abnormal; Notable for the following:    Squamous  Epithelial / LPF 0-5 (*)    Bacteria, UA FEW (*)    Casts HYALINE CASTS (*)    All other components within normal limits  D-DIMER, QUANTITATIVE (NOT AT [facility]) - Abnormal; Notable for the following:    D-Dimer, Quant 8.95 (*)    All other components within normal limits  T4, FREE - Abnormal; Notable for the following:    Free T4 1.53 (*)    All other components within normal limits  GLUCOSE, CAPILLARY - Abnormal; Notable for the following:    Glucose-Capillary 189 (*)    All other components within normal limits  CBC - Abnormal; Notable for the following:    RBC 2.89 (*)    Hemoglobin 7.9 (*)    HCT 24.3 (*)    Platelets 415 (*)    All other components within normal limits  GLUCOSE, CAPILLARY - Abnormal; Notable for the following:    Glucose-Capillary 165 (*)    All other components within normal limits  GLUCOSE, CAPILLARY - Abnormal; Notable for the following:    Glucose-Capillary 138 (*)    All other components within normal limits  CULTURE,  BLOOD (ROUTINE X 2)  CULTURE, BLOOD (ROUTINE X 2)  MRSA PCR SCREENING  URINE CULTURE  CREATININE, URINE, RANDOM  PROTEIN, URINE, RANDOM  BASIC METABOLIC PANEL  I-STAT CG4 LACTIC ACID, ED  I-STAT TROPOININ, ED  TYPE AND SCREEN  PREPARE RBC (CROSSMATCH)    Imaging Review Ct Angio Chest Pe W/cm &/or Wo Cm  04/13/2015  CLINICAL DATA:  Shortness of breath and elevated D-dimer. EXAM: CT ANGIOGRAPHY CHEST WITH CONTRAST TECHNIQUE: Multidetector CT imaging of the chest was performed using the standard protocol during bolus administration of intravenous contrast. Multiplanar CT image reconstructions and MIPs were obtained to evaluate the vascular anatomy. CONTRAST:  73mL OMNIPAQUE IOHEXOL 350 MG/ML SOLN COMPARISON:  None. FINDINGS: THORACIC INLET/BODY WALL: Body wall edema. Tunneled right IJ line with tip at the upper cavoatrial junction. MEDIASTINUM: Normal heart size. No pericardial effusion. No acute vascular abnormality including pulmonary embolism or aortic dissection. No adenopathy. LUNG WINDOWS: There is symmetric central ground-glass opacity with diffuse interlobular septal thickening. Small borderline moderate pleural effusions with fissural extension on the right and mild lung scalloping suggesting early loculation. UPPER ABDOMEN: No acute findings. OSSEOUS: Large right glenohumeral joint effusion which is partially visualized, known based on previous MRI. There is persistent expansion and edematous appearance of the rotator cuff muscles. No erosive changes seen in the visualized joint. 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 Mass measures 4 cm in diameter and 8 cm in length. Review of the MIP images confirms the above findings. IMPRESSION: 1. Negative for pulmonary embolism. 2. Pulmonary edema. Small to moderate bilateral pleural effusion with signs of early loculation. Mild atelectasis. 3. Septic arthritis of the right glenohumeral  joint with large effusion that is grossly similar to 04/06/2015 MRI. Electronically Signed   By: Monte Fantasia M.D.   On: 04/13/2015 05:27   Dg Chest Port 1 View  04/12/2015  CLINICAL DATA:  Shortness of breath EXAM: PORTABLE CHEST 1 VIEW COMPARISON:  04/08/2014 FINDINGS: Right-sided central line in stable position with tip at the SVC level. Normal heart size for technique.  Stable aortic and hilar contours. Layering pleural effusions causing hazy basilar opacity. Diffuse interstitial coarsening with cephalized blood flow. IMPRESSION: Mild pulmonary edema with layering pleural effusions. Electronically Signed   By: Monte Fantasia M.D.   On: 04/12/2015 22:39  I have personally reviewed and evaluated these images and lab results as part of my medical decision-making.  ED ECG REPORT  I personally interpreted this EKG   Date: 04/13/2015   Rate: 119  Rhythm: sinus tachycardia  QRS Axis: normal  Intervals: normal  ST/T Wave abnormalities: normal  Conduction Disutrbances:none  Narrative Interpretation: PRWP  Old EKG Reviewed: none available   MDM   Final diagnoses:  Severe anemia  Acute pulmonary edema (HCC)  Essential hypertension    The patient appears to be in acute respiratory distress, her chest x-ray shows bilateral pulmonary edema, she has a tachycardia, she is likely febrile, I am concerned that the patient has worsening sepsis. She does not have a leukocytosis but does have a severe anemia with a hemoglobin of 7, I suspect that she is going to have more lab abnormalities, she will need to be admitted to the hospital, diuresed, potentially worked up for more signs of sepsis though at this time I am reassured that her lactic acid is normal.  The husband reports that she has been Hemoccult positive, she continues to be anemic, I suspect that her anemia is a big source of the patient's tachycardia and likely a source of the patient's pulmonary edema and what appears to be congestive  heart failure. Her vital signs have remained hypertensive, tachycardic, afebrile. She will receive 2 units of packed red blood cells, I will discuss her care with the hospitalist for admission, the patient does appear to be critically ill with new onset pulmonary edema, severe hypertension and severe anemia.  Transfusion started in the ED,  CRITICAL CARE Performed by: Johnna Acosta Total critical care time: 35 minutes Critical care time was exclusive of separately billable procedures and treating other patients. Critical care was necessary to treat or prevent imminent or life-threatening deterioration. Critical care was time spent personally by me on the following activities: development of treatment plan with patient and/or surrogate as well as nursing, discussions with consultants, evaluation of patient's response to treatment, examination of patient, obtaining history from patient or surrogate, ordering and performing treatments and interventions, ordering and review of laboratory studies, ordering and review of radiographic studies, pulse oximetry and re-evaluation of patient's condition.     Noemi Chapel, MD 04/13/15 Vernelle Emerald

## 2015-04-12 NOTE — ED Notes (Addendum)
Presents with SOB began during hospital stay one week ago for strep bactracemia-taking PCN by PICC, Discharged last Wednesday and came back to ER on Thursday and was let go-today the SOB is worse. Diminished in right base, sats 93% HR 125. Becomes winded very easily, any movement or speech.  C/o pain in right calf that is severe and swelling. -bilateral lower extremity edema noted.

## 2015-04-13 ENCOUNTER — Inpatient Hospital Stay (HOSPITAL_COMMUNITY): Payer: Managed Care, Other (non HMO)

## 2015-04-13 ENCOUNTER — Encounter (HOSPITAL_COMMUNITY): Payer: Self-pay | Admitting: Gastroenterology

## 2015-04-13 ENCOUNTER — Ambulatory Visit (HOSPITAL_COMMUNITY): Payer: Managed Care, Other (non HMO)

## 2015-04-13 DIAGNOSIS — K648 Other hemorrhoids: Secondary | ICD-10-CM | POA: Diagnosis present

## 2015-04-13 DIAGNOSIS — R000 Tachycardia, unspecified: Secondary | ICD-10-CM

## 2015-04-13 DIAGNOSIS — J810 Acute pulmonary edema: Secondary | ICD-10-CM

## 2015-04-13 DIAGNOSIS — I5033 Acute on chronic diastolic (congestive) heart failure: Secondary | ICD-10-CM

## 2015-04-13 DIAGNOSIS — E119 Type 2 diabetes mellitus without complications: Secondary | ICD-10-CM | POA: Diagnosis present

## 2015-04-13 DIAGNOSIS — R0602 Shortness of breath: Secondary | ICD-10-CM | POA: Diagnosis present

## 2015-04-13 DIAGNOSIS — M7121 Synovial cyst of popliteal space [Baker], right knee: Secondary | ICD-10-CM | POA: Diagnosis present

## 2015-04-13 DIAGNOSIS — K449 Diaphragmatic hernia without obstruction or gangrene: Secondary | ICD-10-CM | POA: Diagnosis present

## 2015-04-13 DIAGNOSIS — D123 Benign neoplasm of transverse colon: Secondary | ICD-10-CM | POA: Diagnosis present

## 2015-04-13 DIAGNOSIS — E611 Iron deficiency: Secondary | ICD-10-CM | POA: Diagnosis present

## 2015-04-13 DIAGNOSIS — R7881 Bacteremia: Secondary | ICD-10-CM

## 2015-04-13 DIAGNOSIS — I10 Essential (primary) hypertension: Secondary | ICD-10-CM | POA: Diagnosis not present

## 2015-04-13 DIAGNOSIS — I509 Heart failure, unspecified: Secondary | ICD-10-CM

## 2015-04-13 DIAGNOSIS — M7122 Synovial cyst of popliteal space [Baker], left knee: Secondary | ICD-10-CM | POA: Diagnosis present

## 2015-04-13 DIAGNOSIS — B954 Other streptococcus as the cause of diseases classified elsewhere: Secondary | ICD-10-CM

## 2015-04-13 DIAGNOSIS — R609 Edema, unspecified: Secondary | ICD-10-CM | POA: Diagnosis not present

## 2015-04-13 DIAGNOSIS — K644 Residual hemorrhoidal skin tags: Secondary | ICD-10-CM | POA: Diagnosis present

## 2015-04-13 DIAGNOSIS — D649 Anemia, unspecified: Secondary | ICD-10-CM | POA: Insufficient documentation

## 2015-04-13 DIAGNOSIS — D62 Acute posthemorrhagic anemia: Secondary | ICD-10-CM | POA: Diagnosis present

## 2015-04-13 DIAGNOSIS — J811 Chronic pulmonary edema: Secondary | ICD-10-CM | POA: Diagnosis present

## 2015-04-13 DIAGNOSIS — D638 Anemia in other chronic diseases classified elsewhere: Secondary | ICD-10-CM | POA: Diagnosis present

## 2015-04-13 DIAGNOSIS — I110 Hypertensive heart disease with heart failure: Secondary | ICD-10-CM | POA: Diagnosis present

## 2015-04-13 DIAGNOSIS — E118 Type 2 diabetes mellitus with unspecified complications: Secondary | ICD-10-CM | POA: Diagnosis not present

## 2015-04-13 DIAGNOSIS — I5031 Acute diastolic (congestive) heart failure: Secondary | ICD-10-CM | POA: Diagnosis not present

## 2015-04-13 DIAGNOSIS — Z7984 Long term (current) use of oral hypoglycemic drugs: Secondary | ICD-10-CM | POA: Diagnosis not present

## 2015-04-13 LAB — CBC
HCT: 26.6 % — ABNORMAL LOW (ref 36.0–46.0)
HEMATOCRIT: 24.3 % — AB (ref 36.0–46.0)
HEMOGLOBIN: 7.9 g/dL — AB (ref 12.0–15.0)
HEMOGLOBIN: 8.5 g/dL — AB (ref 12.0–15.0)
MCH: 27.3 pg (ref 26.0–34.0)
MCH: 27.1 pg (ref 26.0–34.0)
MCHC: 32.5 g/dL (ref 30.0–36.0)
MCHC: 32 g/dL (ref 30.0–36.0)
MCV: 84.1 fL (ref 78.0–100.0)
MCV: 84.7 fL (ref 78.0–100.0)
Platelets: 415 10*3/uL — ABNORMAL HIGH (ref 150–400)
Platelets: 380 10*3/uL (ref 150–400)
RBC: 2.89 MIL/uL — ABNORMAL LOW (ref 3.87–5.11)
RBC: 3.14 MIL/uL — ABNORMAL LOW (ref 3.87–5.11)
RDW: 15.2 % (ref 11.5–15.5)
RDW: 15.3 % (ref 11.5–15.5)
WBC: 7.6 10*3/uL (ref 4.0–10.5)
WBC: 9 10*3/uL (ref 4.0–10.5)

## 2015-04-13 LAB — GLUCOSE, CAPILLARY
GLUCOSE-CAPILLARY: 189 mg/dL — AB (ref 65–99)
GLUCOSE-CAPILLARY: 165 mg/dL — AB (ref 65–99)
GLUCOSE-CAPILLARY: 138 mg/dL — AB (ref 65–99)
GLUCOSE-CAPILLARY: 151 mg/dL — AB (ref 65–99)

## 2015-04-13 LAB — BRAIN NATRIURETIC PEPTIDE: B Natriuretic Peptide: 308.7 pg/mL — ABNORMAL HIGH (ref 0.0–100.0)

## 2015-04-13 LAB — CREATININE, URINE, RANDOM: CREATININE, URINE: 26.79 mg/dL

## 2015-04-13 LAB — D-DIMER, QUANTITATIVE: D-Dimer, Quant: 8.95 ug/mL-FEU — ABNORMAL HIGH (ref 0.00–0.50)

## 2015-04-13 LAB — PROTEIN, URINE, RANDOM: Total Protein, Urine: 35 mg/dL

## 2015-04-13 LAB — T4, FREE: FREE T4: 1.53 ng/dL — AB (ref 0.61–1.12)

## 2015-04-13 LAB — PREPARE RBC (CROSSMATCH)

## 2015-04-13 LAB — MRSA PCR SCREENING: MRSA by PCR: NEGATIVE

## 2015-04-13 MED ORDER — OXYCODONE-ACETAMINOPHEN 5-325 MG PO TABS
1.0000 | ORAL_TABLET | ORAL | Status: DC | PRN
  Administered 2015-04-14 – 2015-04-15 (×2): 1 via ORAL
  Filled 2015-04-13 (×2): qty 1

## 2015-04-13 MED ORDER — SODIUM CHLORIDE 0.9 % IV SOLN: INTRAVENOUS | Status: DC

## 2015-04-13 MED ORDER — PENICILLIN G POTASSIUM 20000000 UNITS IJ SOLR
12.0000 10*6.[IU] | Freq: Two times a day (BID) | INTRAVENOUS | Status: DC
  Administered 2015-04-13 – 2015-04-15 (×4): 12 10*6.[IU] via INTRAVENOUS
  Filled 2015-04-13 (×5): qty 12

## 2015-04-13 MED ORDER — ONDANSETRON HCL 4 MG/2ML IJ SOLN
4.0000 mg | Freq: Four times a day (QID) | INTRAMUSCULAR | Status: DC | PRN
  Administered 2015-04-13: 4 mg via INTRAVENOUS
  Filled 2015-04-13: qty 2

## 2015-04-13 MED ORDER — SODIUM CHLORIDE 0.9% FLUSH: 3.0000 mL | INTRAVENOUS | Status: DC | PRN

## 2015-04-13 MED ORDER — FUROSEMIDE 10 MG/ML IJ SOLN
20.0000 mg | Freq: Four times a day (QID) | INTRAMUSCULAR | Status: DC
  Administered 2015-04-13: 20 mg via INTRAVENOUS
  Filled 2015-04-13 (×2): qty 2

## 2015-04-13 MED ORDER — AMLODIPINE BESYLATE 10 MG PO TABS
10.0000 mg | ORAL_TABLET | Freq: Every day | ORAL | Status: DC
  Administered 2015-04-13 – 2015-04-17 (×5): 10 mg via ORAL
  Filled 2015-04-13 (×5): qty 1

## 2015-04-13 MED ORDER — LABETALOL HCL 200 MG PO TABS
200.0000 mg | ORAL_TABLET | Freq: Three times a day (TID) | ORAL | Status: DC
  Administered 2015-04-13 (×3): 200 mg via ORAL
  Filled 2015-04-13 (×4): qty 1

## 2015-04-13 MED ORDER — SODIUM CHLORIDE 0.9 % IV SOLN
250.0000 mL | INTRAVENOUS | Status: DC | PRN
  Administered 2015-04-13: 250 mL via INTRAVENOUS

## 2015-04-13 MED ORDER — PENICILLIN G POTASSIUM 20000000 UNITS IJ SOLR
12.0000 10*6.[IU] | Freq: Two times a day (BID) | INTRAVENOUS | Status: DC
  Administered 2015-04-13: 12 10*6.[IU] via INTRAVENOUS
  Filled 2015-04-13 (×2): qty 12

## 2015-04-13 MED ORDER — PANTOPRAZOLE SODIUM 40 MG IV SOLR
40.0000 mg | Freq: Two times a day (BID) | INTRAVENOUS | Status: DC
  Administered 2015-04-13 – 2015-04-14 (×3): 40 mg via INTRAVENOUS
  Filled 2015-04-13 (×3): qty 40

## 2015-04-13 MED ORDER — SODIUM CHLORIDE 0.9% FLUSH
3.0000 mL | Freq: Two times a day (BID) | INTRAVENOUS | Status: DC
  Administered 2015-04-13 (×2): 3 mL via INTRAVENOUS

## 2015-04-13 MED ORDER — GLUCERNA SHAKE PO LIQD: 237.0000 mL | Freq: Three times a day (TID) | ORAL | Status: DC

## 2015-04-13 MED ORDER — IOHEXOL 350 MG/ML SOLN
80.0000 mL | Freq: Once | INTRAVENOUS | Status: AC | PRN
  Administered 2015-04-13: 80 mL via INTRAVENOUS

## 2015-04-13 MED ORDER — INSULIN ASPART 100 UNIT/ML ~~LOC~~ SOLN
0.0000 [IU] | SUBCUTANEOUS | Status: DC
  Administered 2015-04-13 (×2): 2 [IU] via SUBCUTANEOUS

## 2015-04-13 MED ORDER — LISINOPRIL 5 MG PO TABS
5.0000 mg | ORAL_TABLET | Freq: Every day | ORAL | Status: DC
  Administered 2015-04-13: 5 mg via ORAL
  Filled 2015-04-13 (×2): qty 1

## 2015-04-13 MED ORDER — PEG 3350-KCL-NA BICARB-NACL 420 G PO SOLR
4000.0000 mL | Freq: Once | ORAL | Status: AC
  Administered 2015-04-13: 4000 mL via ORAL
  Filled 2015-04-13: qty 4000

## 2015-04-13 MED ORDER — SODIUM CHLORIDE 0.9% FLUSH
10.0000 mL | INTRAVENOUS | Status: DC | PRN
  Administered 2015-04-14 – 2015-04-17 (×2): 20 mL
  Filled 2015-04-13 (×2): qty 40

## 2015-04-13 MED ORDER — SODIUM CHLORIDE 0.9% FLUSH
10.0000 mL | Freq: Two times a day (BID) | INTRAVENOUS | Status: DC
  Administered 2015-04-13 (×2): 10 mL
  Administered 2015-04-14: 20 mL
  Administered 2015-04-14 – 2015-04-16 (×5): 10 mL

## 2015-04-13 MED ORDER — ACETAMINOPHEN 325 MG PO TABS
650.0000 mg | ORAL_TABLET | ORAL | Status: DC | PRN
  Administered 2015-04-16: 650 mg via ORAL
  Filled 2015-04-13 (×2): qty 2

## 2015-04-13 MED ORDER — FERROUS SULFATE 325 (65 FE) MG PO TABS
325.0000 mg | ORAL_TABLET | Freq: Two times a day (BID) | ORAL | Status: DC
  Administered 2015-04-13: 325 mg via ORAL
  Filled 2015-04-13: qty 1

## 2015-04-13 MED ORDER — FUROSEMIDE 10 MG/ML IJ SOLN
INTRAMUSCULAR | Status: AC
  Administered 2015-04-13: 40 mg via INTRAVENOUS
  Filled 2015-04-13: qty 2

## 2015-04-13 MED ORDER — FUROSEMIDE 10 MG/ML IJ SOLN
40.0000 mg | Freq: Three times a day (TID) | INTRAMUSCULAR | Status: DC
  Administered 2015-04-13 – 2015-04-15 (×6): 40 mg via INTRAVENOUS
  Filled 2015-04-13 (×5): qty 4

## 2015-04-13 MED ORDER — IPRATROPIUM-ALBUTEROL 0.5-2.5 (3) MG/3ML IN SOLN: 3.0000 mL | RESPIRATORY_TRACT | Status: DC | PRN

## 2015-04-13 NOTE — ED Notes (Signed)
Admitting MD in room with pt. 

## 2015-04-13 NOTE — Progress Notes (Signed)
Contacted vascular lab about need for lower ext dopplers earlier today. [place] from vascular lab called at [place] and stated she would not be able to do tonight and would be first pt in am. Carroll Kinds RN

## 2015-04-13 NOTE — H&P (Signed)
[facility] History and Physical  [name] [id number] DOB: 19-Apr-1965 DOA: 04/12/2015  Referring physician:ED PCP: Candice Hams, MD   Chief Complaint: Shortness of breath  HPI:  [name] is a 50 year old female with past medical history significant for HTN, diabetes mellitus type 2, recent streptococcal bacteremia with septic arthritis of the right shoulder joint status post evacuation with placement of a right subclavian catheter to receive 4 weeks(thur 2/13 ) of penicillin G  hospitalized at [name] from 1/14 until 1/25; who presents with worsening shortness of breath. Since being discharged patient noted that she had right calf pain and swelling for which she came back to the emergency department on the 26th and was evaluated and told that it was a hematoma and there are no signs of clot. She reports continued intermittent low-grade fevers up to 53F. [name] notes that since even before she left the [name] she's been tachycardic and that her hemoglobins have been significantly low. He states that her anemia is the cause for her congestive heart failure and he wants her transfused blood until her hemoglobin is 10. In reviewing patient's discharge summary it appears she had received 2 units of blood during her previous admission and because of the guaiac positive stools she was referred to GI as outpatient. Patient was not able to follow-up with GI prior to return to the [name] today. [name] requested that GI be consulted [name]. Patient complains of swelling of her lower extremities bilaterally. She reports having a improved appetite since being discharged.  Upon admission to day patient was found to be significantly tachycardic and tachypneic. Initial BMP was elevated at 308.7. She is given a dose of Lasix: while in the ED. For the patient's anemia as her hemoglobin was noted to be 7 she was typed and screened and ordered to be transfused 2 units.   Review of  Systems  Constitutional: Positive for fever and malaise/fatigue. Negative for chills and weight loss.  HENT: Negative for ear pain and hearing loss.   Eyes: Negative for photophobia and pain.  Respiratory: Positive for shortness of breath. Negative for hemoptysis.   Cardiovascular: Positive for palpitations and leg swelling. Negative for chest pain.  Gastrointestinal: Negative for vomiting and abdominal pain.  Genitourinary: Negative for urgency and frequency.  Musculoskeletal: Negative for back pain and neck pain.  Skin: Negative for itching and rash.  Neurological: Negative for sensory change and speech change.  Endo/Heme/Allergies: Negative for environmental allergies and polydipsia.  Psychiatric/Behavioral: Negative for suicidal ideas and substance abuse.       Past Medical History  Diagnosis Date  . Hypertension   . Diabetes mellitus without complication [name])      Past Surgical History  Procedure Laterality Date  . Breast surgery    . Uterine fibroid surgery    . Left oophorectomy Left   . I&d extremity Right 03/30/2015    Procedure: IRRIGATION AND DEBRIDEMENT EXTREMITY;  Surgeon: Renette Butters, MD;  Location: [name];  Service: Orthopedics;  Laterality: Right;  . Shoulder arthroscopy Right 03/30/2015    Procedure: ARTHROSCOPY SHOULDER;  Surgeon: Renette Butters, MD;  Location: [name];  Service: Orthopedics;  Laterality: Right;      Social History:  reports that she has never smoked. She does not have any smokeless tobacco history on file. She reports that she does not drink alcohol or use illicit drugs.  Where does patient live--home    and with whom if at home? [name] Can patient participate in ADLs?  Yes  No Known Allergies  No family history on file.     Prior to Admission medications   Medication Sig Start Date End Date Taking? Authorizing Provider  acetaminophen (TYLENOL) 325 MG tablet Take 325 mg by mouth every 6 (six) hours as needed for mild pain or  moderate pain.   Yes Historical Provider, MD  amLODipine (NORVASC) 10 MG tablet Take 1 tablet (10 mg total) by mouth daily. 04/07/15  Yes [name], MD  feeding supplement, GLUCERNA SHAKE, (GLUCERNA SHAKE) LIQD Take 237 mLs by mouth 3 (three) times daily between meals. 04/07/15  Yes [name], MD  ferrous sulfate 325 (65 FE) MG tablet Take 1 tablet (325 mg total) by mouth 2 (two) times daily with a meal. 04/07/15  Yes [name], MD  labetalol (NORMODYNE) 200 MG tablet Take 1 tablet (200 mg total) by mouth 3 (three) times daily. 04/07/15  Yes [name], MD  metFORMIN (GLUCOPHAGE) 500 MG tablet Take 500 mg by mouth 2 (two) times daily. 03/08/15  Yes Historical Provider, MD  oxyCODONE-acetaminophen (PERCOCET) 5-325 MG tablet Take 0.5-1 tablets by mouth every 4 (four) hours as needed. 04/09/15  Yes Orpah Greek, MD  penicillin G potassium in dextrose 5 % 500 mL Please infuse 24 million units over 24 hours continuously through 04/26/2015. 04/07/15  Yes [name], MD  furosemide (LASIX) 20 MG tablet Take 20 mg by mouth daily. 04/12/15   Historical Provider, MD  polyethylene glycol (MIRALAX / GLYCOLAX) packet Take 17 g by mouth daily. Patient not taking: Reported on 04/08/2015 04/07/15   [name], MD  saccharomyces boulardii (FLORASTOR) 250 MG capsule Take 1 capsule (250 mg total) by mouth 2 (two) times daily. Patient not taking: Reported on 04/08/2015 04/07/15   [name], MD  senna-docusate (SENOKOT-S) 8.6-50 MG tablet Take 2 tablets by mouth at bedtime as needed for mild constipation. Patient not taking: Reported on 04/08/2015 04/07/15   [name], MD     Physical Exam: Filed Vitals:   04/13/15 0225 04/13/15 0236 04/13/15 0630 04/13/15 0734  BP: 174/89 170/145 170/104   Pulse: 120 120 122   Temp: 98.9 F (37.2 C) 98.9 F (37.2 C)  99 F (37.2 C)  TempSrc: Oral Oral  Oral  Resp: 25 33 33   Height:  5\' 6"  (1.676 m)    Weight:  83.054 kg (183 lb 1.6  oz)    SpO2: 95% 96% 99%      Constitutional: Vital signs reviewed. Patient has an ill-appearance and appears to be in some mild distress. Head: Normocephalic and atraumatic  Ear: TM normal bilaterally  Mouth: no erythema or exudates, MMM  Eyes: PERRL, EOMI, conjunctivae normal, No scleral icterus.  Neck: Supple, Trachea midline normal ROM, No JVD, mass, thyromegaly, or carotid bruit present.  Cardiovascular: Tachycardic Pulmonary/Chest:   positive crackles heard bilaterally no wheezes appreciated  Abdominal: Soft. Non-tender, non-distended, bowel sounds are normal, no masses, organomegaly, or guarding present.  GU: no CVA tenderness Musculoskeletal: No joint deformities, erythema, or stiffness, ROM full and no nontender Ext : +2 pitting edema of the lower extremities. No cyanosis, pulses palpable bilaterally (DP and PT)  Hematology: no cervical, inginal, or axillary adenopathy.  Neurological: A&O x3, Strenght is normal and symmetric bilaterally, cranial nerve II-XII are grossly intact, no focal motor deficit, sensory intact to light touch bilaterally.  Skin: Warm, diaphoretic and intact. No rash, cyanosis, or clubbing.  Psychiatric: Normal mood and affect. speech and behavior is normal. Judgment and thought  content normal. Cognition and memory are normal.      Data Review   Micro Results Recent Results (from the past 240 hour(s))  Culture, blood (Routine X 2) w Reflex to ID Panel     Status: None   Collection Time: 04/03/15  8:10 PM  Result Value Ref Range Status   Specimen Description BLOOD LEFT ARM  Final   Special Requests BOTTLES DRAWN AEROBIC AND ANAEROBIC 5CC  Final   Culture NO GROWTH 5 DAYS  Final   Report Status 04/08/2015 FINAL  Final  Culture, blood (Routine X 2) w Reflex to ID Panel     Status: None   Collection Time: 04/03/15  8:21 PM  Result Value Ref Range Status   Specimen Description BLOOD RIGHT ARM  Final   Special Requests BOTTLES DRAWN AEROBIC AND ANAEROBIC  5CC  Final   Culture NO GROWTH 5 DAYS  Final   Report Status 04/08/2015 FINAL  Final  Culture, routine-abscess     Status: None   Collection Time: 04/09/15  4:01 AM  Result Value Ref Range Status   Specimen Description ABSCESS  Final   Special Requests RIGHT CALF  Final   Gram Stain   Final    ABUNDANT WBC PRESENT,BOTH PMN AND MONONUCLEAR NO SQUAMOUS EPITHELIAL CELLS SEEN NO ORGANISMS SEEN Performed at [facility]    Culture   Final    NO GROWTH 2 DAYS Performed at [facility]    Report Status 04/11/2015 FINAL  Final  MRSA PCR Screening     Status: None   Collection Time: 04/13/15  2:22 AM  Result Value Ref Range Status   MRSA by PCR NEGATIVE NEGATIVE Final    Comment:        The GeneXpert MRSA Assay (FDA approved for NASAL specimens only), is one component of a comprehensive MRSA colonization surveillance program. It is not intended to diagnose MRSA infection nor to guide or monitor treatment for MRSA infections.     Radiology Reports Dg Chest 2 View  04/09/2015  CLINICAL DATA:  Bilateral leg swelling with pain in the posterior leg since last night. PICC line. History of hypertension and diabetes. Recent shoulder surgery. EXAM: CHEST  2 VIEW COMPARISON:  03/31/2015 FINDINGS: Interval placement of a right central venous catheter with tip over the low SVC region. No pneumothorax. Mild cardiac enlargement with mild diffuse pulmonary vascular congestion. Interstitial changes in the lungs suggesting interstitial edema. Small bilateral pleural effusions with basilar atelectasis. Anterior inferior appearance of the humeral head with respect to the glenoid fossa may be postoperative but could indicate anterior dislocation of the right shoulder. IMPRESSION: Right central venous catheter appears in satisfactory position. No pneumothorax. Pulmonary vascular congestion with interstitial edema, small bilateral pleural effusions, and basilar atelectasis. Possible anterior  dislocation of the right shoulder. Electronically Signed   By: Lucienne Capers M.D.   On: 04/09/2015 02:06   Ct Angio Chest Pe W/cm &/or Wo Cm  04/13/2015  CLINICAL DATA:  Shortness of breath and elevated D-dimer. EXAM: CT ANGIOGRAPHY CHEST WITH CONTRAST TECHNIQUE: Multidetector CT imaging of the chest was performed using the standard protocol during bolus administration of intravenous contrast. Multiplanar CT image reconstructions and MIPs were obtained to evaluate the vascular anatomy. CONTRAST:  21mL OMNIPAQUE IOHEXOL 350 MG/ML SOLN COMPARISON:  None. FINDINGS: THORACIC INLET/BODY WALL: Body wall edema. Tunneled right IJ line with tip at the upper cavoatrial junction. MEDIASTINUM: Normal heart size. No pericardial effusion. No acute vascular abnormality including pulmonary  embolism or aortic dissection. No adenopathy. LUNG WINDOWS: There is symmetric central ground-glass opacity with diffuse interlobular septal thickening. Small borderline moderate pleural effusions with fissural extension on the right and mild lung scalloping suggesting early loculation. UPPER ABDOMEN: No acute findings. OSSEOUS: Large right glenohumeral joint effusion which is partially visualized, known based on previous MRI. There is persistent expansion and edematous appearance of the rotator cuff muscles. No erosive changes seen in the visualized joint. 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 Mass measures 4 cm in diameter and 8 cm in length. Review of the MIP images confirms the above findings. IMPRESSION: 1. Negative for pulmonary embolism. 2. Pulmonary edema. Small to moderate bilateral pleural effusion with signs of early loculation. Mild atelectasis. 3. Septic arthritis of the right glenohumeral joint with large effusion that is grossly similar to 04/06/2015 MRI. Electronically Signed   By: Monte Fantasia M.D.   On: 04/13/2015 05:27   Mr Humerus Right Wo  Contrast  04/06/2015  CLINICAL DATA:  Pain and swelling of the right upper extremity EXAM: MRI OF THE RIGHT HUMERUS WITHOUT CONTRAST TECHNIQUE: Multiplanar, multisequence MR imaging was performed. No intravenous contrast was administered. COMPARISON:  03/29/2015 FINDINGS: Again observed is a large glenohumeral joint effusion with edema tracking along adjacent muscle plans including the deltoid and rotator cuff musculature. The deltoid edema is most confluent anteriorly and there is a small amount of edema tracking along the superficial margin of the pectoralis major muscle. Edema tracks along the superficial fascia margin of the right rib cage. Fluid tracks along the superficial fascia margins of the biceps and triceps. There is muscular edema of the lateral head of the triceps and to a lesser extent medially in the brachialis. No significant elbow joint effusion although there is considerable subcutaneous edema along the elbow. I do not observe a drainable abscess. Edema tracking along the superficial fascia margins extends into the forearm. IMPRESSION: 1. There continues to be a right glenohumeral joint effusion which could be septic. Considerable edema in the surrounding rotator cuff and deltoid musculature. 2. Infiltrative edema also tracks along the margins (primarily the superficial fascia margins) of the pectoralis, biceps, and triceps musculature. There is no elbow joint effusion. Subcutaneous edema tracks into the forearm. I do not see a discrete drainable abscess. Potential myositis involving the rotator cuff musculature, anterior deltoid, and lateral head of the triceps. Electronically Signed   By: Van Clines M.D.   On: 04/06/2015 07:14   US Soft Tissue Head/neck  03/27/2015  CLINICAL DATA:  Left-sided neck pain EXAM: ULTRASOUND LEFT NECK TECHNIQUE: Longitudinal and transverse images of the left neck region in the area of pain performed COMPARISON:  None. FINDINGS: Sonographic images of the  left neck show no mass or inflammatory focus. No abnormal fluid collection. In particular, no abscess. Several tiny lymph nodes are noted in this area; by size criteria, there is no adenopathy in this area. Vascular structures of this area are widely patent. IMPRESSION: Scattered subcentimeter lymph nodes in the left neck, not meeting size criteria for pathologic significance. No mass or inflammatory focus appreciable. No abscess in particular. Electronically Signed   By: Lowella Grip III M.D.   On: 03/27/2015 17:19   [place] Chest  03/31/2015  CLINICAL DATA:  50 year old female with chest x-ray demonstrating right-sided small pleural effusion. EXAM: CHEST ULTRASOUND COMPARISON:  None. FINDINGS: Ultrasound survey of the right chest demonstrating small complex pleural effusion, not amenable to ultrasound-guided drainage.  IMPRESSION: Small, complex appearing right pleural effusion not amenable for ultrasound-guided thoracentesis. Signed, Dulcy Fanny. Earleen Newport, DO [name] Electronically Signed   By: [name] D.O.   On: 03/31/2015 11:05   US Abdomen Complete  03/27/2015  CLINICAL DATA:  Septic, generalized upper and lower abdominal pain, positive pregnancy test EXAM: ABDOMEN ULTRASOUND COMPLETE COMPARISON:  None FINDINGS: Gallbladder: Dependent echogenic material with minimal shadowing likely calculi in gallbladder. No gallbladder wall thickening, pericholecystic fluid, or sonographic Murphy sign. Common bile duct: Diameter: Normal caliber 4 mm diameter Liver: Normal appearance IVC: Normal appearance Pancreas: Distal tail obscured by bowel gas. Visualized portion normal appearance. Spleen: Normal appearance, 8.5 cm length Right Kidney: Length: 12.5 cm. Increased cortical echogenicity. Normal cortical thickness. No mass or hydronephrosis. Left Kidney: Length: 12.7 cm. Increased cortical echogenicity. Normal cortical thickness. No mass or hydronephrosis.  Abdominal aorta: Normal caliber Other findings: No free fluid IMPRESSION: Probable medical renal disease changes. Probable cholelithiasis without evidence acute cholecystitis. Remainder of exam unremarkable. Electronically Signed   By: Lavonia Dana M.D.   On: 03/27/2015 17:29   US Ob Comp Less 14 Wks  03/27/2015  CLINICAL DATA:  Low beta HCG of 27. Abdominal pain with sepsis. Elevated white blood cell count. EXAM: OBSTETRIC <14 WK [place] AND TRANSVAGINAL OB US TECHNIQUE: Both transabdominal and transvaginal ultrasound examinations were performed for complete evaluation of the gestation as well as the maternal uterus, adnexal regions, and pelvic cul-de-sac. Transvaginal technique was performed to assess early pregnancy. COMPARISON:  Ultrasound 02/13/2012 FINDINGS: Intrauterine gestational sac: None Yolk sac:  Not present Embryo:  Not present Maternal uterus/adnexae: The uterus is lobular and heterogeneous echotexture consistent multiple leiomyoma. This appears similar to the ultrasound of 2013. The endometrium is difficult to identified on the background of the multiple leiomyoma but measures 8 mm. Additionally, the ovaries are not identified. There is reported history of a LEFT oophorectomy in 2008. No free fluid. IMPRESSION: 1. Leiomyomatous uterus similar to 2013. 2. No decidual reaction or gestational sac identified. 3. No intrauterine gestational sac, yolk sac, or fetal pole identified. Differential considerations include intrauterine pregnancy too early to be sonographically visualized, missed abortion, or ectopic pregnancy. Followup ultrasound is recommended in 10-14 days for further evaluation. Electronically Signed   By: Suzy Bouchard M.D.   On: 03/27/2015 18:07   US Ob Transvaginal  03/27/2015  CLINICAL DATA:  Low beta HCG of 27. Abdominal pain with sepsis. Elevated white blood cell count. EXAM: OBSTETRIC <14 WK [place] AND TRANSVAGINAL OB US TECHNIQUE: Both transabdominal and transvaginal ultrasound  examinations were performed for complete evaluation of the gestation as well as the maternal uterus, adnexal regions, and pelvic cul-de-sac. Transvaginal technique was performed to assess early pregnancy. COMPARISON:  Ultrasound 02/13/2012 FINDINGS: Intrauterine gestational sac: None Yolk sac:  Not present Embryo:  Not present Maternal uterus/adnexae: The uterus is lobular and heterogeneous echotexture consistent multiple leiomyoma. This appears similar to the ultrasound of 2013. The endometrium is difficult to identified on the background of the multiple leiomyoma but measures 8 mm. Additionally, the ovaries are not identified. There is reported history of a LEFT oophorectomy in 2008. No free fluid. IMPRESSION: 1. Leiomyomatous uterus similar to 2013. 2. No decidual reaction or gestational sac identified. 3. No intrauterine gestational sac, yolk sac, or fetal pole identified. Differential considerations include intrauterine pregnancy too early to be sonographically visualized, missed abortion, or ectopic pregnancy. Followup ultrasound is recommended in 10-14 days for further evaluation. Electronically Signed  By: Suzy Bouchard M.D.   On: 03/27/2015 18:07   Ct Angio Ao+bifem W/cm &/or Wo/cm  04/09/2015  CLINICAL DATA:  Increased swelling of the right calf and fever. Patient was just discharged for bacteremia. EXAM: CT ANGIOGRAPHY OF ABDOMINAL AORTA WITH ILIOFEMORAL RUNOFF TECHNIQUE: Multidetector CT imaging of the abdomen, pelvis and lower extremities was performed using the standard protocol during bolus administration of intravenous contrast. Multiplanar CT image reconstructions and MIPs were obtained to evaluate the vascular anatomy. CONTRAST:  171mL OMNIPAQUE IOHEXOL 350 MG/ML SOLN COMPARISON:  None. FINDINGS: Aorta: Normal caliber abdominal aorta. No evidence of aortic dissection. The abdominal aorta, celiac axis, superior mesenteric artery, bilateral renal arteries, inferior mesenteric artery, and  bilateral iliac, external iliac, internal iliac, and common femoral arteries are patent. Renal nephrograms are symmetrical. Right Lower Extremity: The right common femoral, deep femoral, superficial femoral, popliteal, tibial trunk, and tibial trifurcation vessels are patent to the right ankle. No evidence of significant vascular occlusion. Left Lower Extremity: The left common femoral, deep femoral, superficial femoral, popliteal, tibial trunk, and tibial trifurcation vessels are patent to the left ankle. No evidence of significant vascular occlusion. With Abdomen: Small bilateral pleural effusions with basilar atelectasis. Cholelithiasis with small stones in the gallbladder. No gallbladder wall thickening or bile duct dilatation. The liver, spleen, pancreas, adrenal glands, kidneys, inferior vena cava, and retroperitoneal lymph nodes are unremarkable. There is somewhat hazy infiltration in the retroperitoneal fat. This could represent retroperitoneal fibrosis or inflammatory changes. No discrete abscess. Retroperitoneal lymph nodes are likely reactive. Stomach, small bowel, and colon are decompressed. No free air or free fluid in the abdomen. Is edema in the subcutaneous fat. Pelvis: Diffuse enlargement of the uterus with multiple calcified fibroids. No abnormal adnexal masses. Appendix is normal. Small amount of free fluid in the pelvis. Bladder wall is not thickened. No pelvic lymphadenopathy. Degenerative changes in the lumbar spine. No destructive bone lesions. Lower extremities: Edema is demonstrated in the subcutaneous fat of the lower legs bilaterally. This is greater on the right. In the posterior compartment musculature of the right calf beginning at the level of the knee, there is a heterogeneous collection measuring 1.7 x 4.2 cm. This could represent abscess or hematoma. Consider ultrasound for further evaluation if clinically indicated. Small right popliteal cyst. Small left popliteal cyst. Bones  appear intact. No evidence of acute fracture or bone erosion. No evidence of osteomyelitis. Review of the MIP images confirms the above findings. IMPRESSION: Abdominal aorta and runoff vessels are patent bilaterally to the ankles. No evidence of vascular stenosis or occlusion. Small bilateral pleural effusions with basilar atelectasis in the lungs. Diffuse edema throughout the subcutaneous fat of the abdomen and of the lower legs, greater on the right. Heterogeneous fluid collection in the posterior compartment of the right lower leg beginning at the level of the knee suggesting hematoma or abscess. Cholelithiasis. Hazy infiltration of the retroperitoneal fat suggesting infectious/ inflammatory process versus retroperitoneal fibrosis. Electronically Signed   By: Lucienne Capers M.D.   On: 04/09/2015 02:55   Mr Shoulder Right Wo Contrast  03/30/2015  CLINICAL DATA:  Joint pain most severe in the right shoulder. Sepsis. EXAM: MRI OF THE RIGHT SHOULDER WITHOUT CONTRAST TECHNIQUE: Multiplanar, multisequence MR imaging of the shoulder was performed. No intravenous contrast was administered. COMPARISON:  03/27/2015 FINDINGS: Rotator cuff: Prominent partial thickness articular surface tearing of the supraspinatus, with the articular surface portion retracted 1.7 cm on image 16 series 5, compatible with tendon delamination. Moderate tendinopathy of the  remaining supraspinatus tendon. Similarly there is partial thickness articular surface tearing of the infraspinatus tendon with moderate infraspinatus tendinopathy. Moderate subscapularis tendinopathy is present along with potential partial thickness articular surface tearing Muscles: Abnormal edema tracks primarily along the marginal portions of the supraspinatus, subscapularis, teres minor, and teres major muscles. There is also low-level edema along the superficial fascia margin of the lateral deltoid. Biceps long head: Partial tearing or advanced tendinopathy of the  intra-articular segment. Acromioclavicular Joint: Mild degenerative AC joint arthropathy. Subacromial morphology is type 2 (curved). Small but abnormal amount of fluid in the subacromial subdeltoid bursa. Glenohumeral Joint: Prominent glenohumeral joint effusion. Moderate degenerative chondral thinning along the glenohumeral joint. Labrum: Linear irregularity in the superior labrum with a blunted appearance, compatible with SLAP tear extending into the biceps anchor. Bones: Unusual flaring of the proximal humeral metadiaphysis, query prior fracture. Small right axillary lymph nodes are observed. IMPRESSION: 1. Large shoulder joint effusion with abnormal edema tracking within and along the margins of the regional musculature. In the setting of sepsis and new onset shoulder pain, there is a high degree of concern for septic glenohumeral joint, and arthrocentesis is likely indicated. 2. Partial thickness articular surface tearing of the supraspinatus and infraspinatus with tendon delamination. There is potentially mild partial thickness articular surface tearing of the subscapularis. Considerable tendinopathy of these tendons as well. 3. Advanced tendinopathy or partial tearing of the long head of the biceps. 4. Increased signal in the superior labrum compatible with SLAP tear extending into the biceps anchor. 5. Subacromial subdeltoid bursitis. 6. Somewhat unusual flaring of the proximal humeral metadiaphysis, query prior fracture. 7. Moderate degenerative glenohumeral arthropathy. These results will be called to the ordering clinician or representative by the Radiologist Assistant, and communication documented in the PACS or zVision Dashboard. Electronically Signed   By: Van Clines M.D.   On: 03/30/2015 07:28   Ir Fluoro Guide Cv Line Right  04/01/2015  CLINICAL DATA:  50 year old female with a history of septic arthritis. She has been referred for tunneled catheter placement for antibiotics. EXAM: IR  RIGHT FLOURO GUIDE CV LINE; IR ULTRASOUND GUIDANCE VASC ACCESS RIGHT Date: 04/01/2015 ANESTHESIA/SEDATION: None Total sedation time: 0 minutes FLUOROSCOPY TIME:  30 second TECHNIQUE: The procedure, risks, benefits, and alternatives were explained to the patient. Questions regarding the procedure were encouraged and answered. The patient understands and consents to the procedure. The right neck and chest was prepped with chlorhexidine, and draped in the usual sterile fashion using maximum barrier technique (cap and mask, sterile gown, sterile gloves, large sterile sheet, hand hygiene and cutaneous antiseptic). Local anesthesia was attained by infiltration with 1% lidocaine without epinephrine. Ultrasound demonstrated patency of the right internal jugular vein, and this was documented with an image. Under real-time ultrasound guidance, this vein was accessed with a 21 gauge micropuncture needle and image documentation was performed. A small dermatotomy was made at the access site with an 11 scalpel. A 0.018" wire was advanced into the SVC and the access needle exchanged for a 34F micropuncture vascular sheath. The 0.018" wire was then removed and a 0.035" wire advanced into the IVC. Peel-away sheath was placed over the wire, and upon removal of the wire appropriate internal length was obtained/measured. Skin and subcutaneous tissues on the right chest wall just inferior to the clavicle were infiltrated with 1% lidocaine for local anesthesia. Small stab incision was made with 11 blade scalpel. The catheter was back tunneled to the incision at the neck, with the catheter pulled through  the subcutaneous tunnel. Catheter was then ligated at the appropriate length at the neck and passed through the peel-away sheath. Peel-away sheath was removed. Final image was stored. Catheter was then sutured onto the right chest wall with retention sutures. The dermatotomy at the venous access site was also closed with Dermabond. Patient  tolerated the procedure well and remained hemodynamically stable throughout. No complications were encountered and no significant blood loss was encountered. COMPLICATIONS: None IMPRESSION: Status post placement of right IJ tunneled catheter measuring 24 centimeter. Catheter ready for use. Signed, Dulcy Fanny. Earleen Newport, DO [facility] Radiology Electronically Signed   By: [name] D.O.   On: 04/01/2015 14:45   Ir US Guide Vasc Access Right  04/01/2015  CLINICAL DATA:  50 year old female with a history of septic arthritis. She has been referred for tunneled catheter placement for antibiotics. EXAM: IR RIGHT FLOURO GUIDE CV LINE; IR ULTRASOUND GUIDANCE VASC ACCESS RIGHT Date: 04/01/2015 ANESTHESIA/SEDATION: None Total sedation time: 0 minutes FLUOROSCOPY TIME:  30 second TECHNIQUE: The procedure, risks, benefits, and alternatives were explained to the patient. Questions regarding the procedure were encouraged and answered. The patient understands and consents to the procedure. The right neck and chest was prepped with chlorhexidine, and draped in the usual sterile fashion using maximum barrier technique (cap and mask, sterile gown, sterile gloves, large sterile sheet, hand hygiene and cutaneous antiseptic). Local anesthesia was attained by infiltration with 1% lidocaine without epinephrine. Ultrasound demonstrated patency of the right internal jugular vein, and this was documented with an image. Under real-time ultrasound guidance, this vein was accessed with a 21 gauge micropuncture needle and image documentation was performed. A small dermatotomy was made at the access site with an 11 scalpel. A 0.018" wire was advanced into the SVC and the access needle exchanged for a 27F micropuncture vascular sheath. The 0.018" wire was then removed and a 0.035" wire advanced into the IVC. Peel-away sheath was placed over the wire, and upon removal of the wire appropriate internal  length was obtained/measured. Skin and subcutaneous tissues on the right chest wall just inferior to the clavicle were infiltrated with 1% lidocaine for local anesthesia. Small stab incision was made with 11 blade scalpel. The catheter was back tunneled to the incision at the neck, with the catheter pulled through the subcutaneous tunnel. Catheter was then ligated at the appropriate length at the neck and passed through the peel-away sheath. Peel-away sheath was removed. Final image was stored. Catheter was then sutured onto the right chest wall with retention sutures. The dermatotomy at the venous access site was also closed with Dermabond. Patient tolerated the procedure well and remained hemodynamically stable throughout. No complications were encountered and no significant blood loss was encountered. COMPLICATIONS: None IMPRESSION: Status post placement of right IJ tunneled catheter measuring 24 centimeter. Catheter ready for use. Signed, Dulcy Fanny. Earleen Newport, DO [name] Radiology Electronically Signed   By: [name] D.O.   On: 04/01/2015 14:45   Dg Chest Port 1 View  04/12/2015  CLINICAL DATA:  Shortness of breath EXAM: PORTABLE CHEST 1 VIEW COMPARISON:  04/08/2014 FINDINGS: Right-sided central line in stable position with tip at the SVC level. Normal heart size for technique.  Stable aortic and hilar contours. Layering pleural effusions causing hazy basilar opacity. Diffuse interstitial coarsening with cephalized blood flow. IMPRESSION: Mild pulmonary edema with layering pleural effusions. Electronically Signed   By: Monte Fantasia M.D.   On: 04/12/2015 22:39  Dg Chest Port 1 View  03/31/2015  CLINICAL DATA:  Acute onset of shortness of breath. Initial encounter. EXAM: PORTABLE CHEST 1 VIEW COMPARISON:  Chest radiograph from 03/27/2015 FINDINGS: A small to moderate right-sided pleural effusion is noted. Right basilar airspace opacity may reflect  atelectasis or pneumonia. Asymmetric interstitial edema could have a similar appearance. Underlying vascular congestion is seen. No pneumothorax is identified. The cardiomediastinal silhouette is borderline enlarged. No acute osseous abnormalities are seen. IMPRESSION: 1. Small to moderate right-sided pleural effusion noted. Right basilar airspace opacity may reflect atelectasis or pneumonia. Asymmetric interstitial edema could have a similar appearance. 2. Underlying vascular congestion and borderline cardiomegaly. Electronically Signed   By: Garald Balding M.D.   On: 03/31/2015 01:49   Dg Chest Port 1 View  03/27/2015  CLINICAL DATA:  Fever for 3 days EXAM: PORTABLE CHEST 1 VIEW COMPARISON:  None. FINDINGS: Lungs are clear. Heart is upper normal in size with pulmonary vascularity within normal limits. No adenopathy. No bone lesions. IMPRESSION: No edema or consolidation. Electronically Signed   By: Lowella Grip III M.D.   On: 03/27/2015 15:43   Dg Fluoro Guided Needle Plc Aspiration/injection Loc  03/30/2015  CLINICAL DATA:  Prior MRI suspicious for septic glenohumeral joint. Sepsis. EXAM: FLUORO GUIDED NEEDLE PLACEMENT FLUOROSCOPY TIME:  If the device does not provide the exposure index: Fluoroscopy Time (in minutes and seconds):  1 minutes and 42 seconds Number of Acquired Images:  None COMPARISON:  MRI of 03/29/2015. FINDINGS: Informed written and verbal consent were obtained. Risks and benefits of the procedure were discussed. A "Time out" was performed. The superior medial portion of the right humeral head was localized under fluoroscopic guidance. Skin was prepped and draped in standard sterile fashion. Skin was numbed with 1% lidocaine. A 20 gauge needle was inserted into the joint. No fluid could be aspirated. Intra-articular location was confirmed with approximately 6 cc of Omnipaque 180. Subsequent, approximately 5 cc of saline were injected. Only minimal fluid could be returned. IMPRESSION:  Uncomplicated aspiration of the right shoulder, as detailed above. Minimal fluid was obtained, and sent to laboratory as requested. Electronically Signed   By: Abigail Miyamoto M.D.   On: 03/30/2015 12:11     CBC  Recent Labs Lab 04/07/15 0448 04/08/15 2000 04/12/15 2153  WBC 12.1* 11.0* 9.9  HGB 8.3* 7.9* 7.0*  HCT 24.2* 23.9* 21.6*  PLT 439* 530* 474*  MCV 84.3 84.5 84.7  MCH 28.9 27.9 27.5  MCHC 34.3 33.1 32.4  RDW 15.2 15.4 15.7*  LYMPHSABS  --  1.3 1.3  MONOABS  --  0.1 0.8  EOSABS  --  0.0 0.1  BASOSABS  --  0.0 0.1    Chemistries   Recent Labs Lab 04/07/15 0448 04/08/15 2000 04/08/15 2245 04/12/15 2153  NA 129* 134*  --  137  K 4.5 4.4  --  3.9  CL 95* 100*  --  104  CO2 22 22  --  22  GLUCOSE 215* 220*  --  267*  BUN 17 15  --  11  CREATININE 1.11* 1.20*  --  1.01*  CALCIUM 8.3* 8.5*  --  8.7*  AST 22  --  21 20  ALT 14  --  14 14  ALKPHOS 74  --  71 80  BILITOT 0.7  --  0.3 0.6   ------------------------------------------------------------------------------------------------------------------ estimated creatinine clearance is 72.4 mL/min (by C-G formula based on Cr of 1.01). ------------------------------------------------------------------------------------------------------------------ No results for input(s): HGBA1C in the  last 72 hours. ------------------------------------------------------------------------------------------------------------------ No results for input(s): CHOL, HDL, LDLCALC, TRIG, CHOLHDL, LDLDIRECT in the last 72 hours. ------------------------------------------------------------------------------------------------------------------ No results for input(s): TSH, T4TOTAL, T3FREE, THYROIDAB in the last 72 hours.  Invalid input(s): FREET3 ------------------------------------------------------------------------------------------------------------------ No results for input(s): VITAMINB12, FOLATE, FERRITIN, TIBC, IRON, RETICCTPCT in the  last 72 hours.  Coagulation profile No results for input(s): INR, PROTIME in the last 168 hours.   Recent Labs  04/13/15 0123  DDIMER 8.95*    Cardiac Enzymes No results for input(s): CKMB, TROPONINI, MYOGLOBIN in the last 168 hours.  Invalid input(s): CK ------------------------------------------------------------------------------------------------------------------ Invalid input(s): POCBNP   CBG:  Recent Labs Lab 04/06/15 2203 04/07/15 0807 04/07/15 1221 04/07/15 1643 04/13/15 0733  GLUCAP 169* 286* 217* 197* 189*       EKG: Independently reviewed. Sinus tachycardia   Assessment/Plan   Acute congestive heart failure [facility]): Acute patient with note of progressively worsening lower extremity edema and shortness of breath. BNP found to be elevated at 308.7. Last echocardiogram showed EF of 60-65% in 03/2015. Patient was given 40 mg of IV Lasix while in the ED. - Admit to stepdown - Lasix IV 20 mg every 6 hours - Strict ins and outs and daily weights - Consult cardiology and follow recommendations  Pulmonary edema: Acute. see above  Suspect GI bleed: Patient with continued anemia hemoglobin 7 on admission. Patient was typed and screened by the emergency department and ordered to be transfused 2 units. - Consult GI for further evaluation - Protonix IV  Acute blood loss anemia: Patient with previous guaiac-positive stools on last admission. Currently on iron replacement for history of iron deficiency. - Continue iron replacement - Check posttransfusion H&H, and transfuse if needed  Diabetes mellitus type 2 last hemoglobin A1c was 7  - Hold metformin  - CBGs every 4 hours for now with sensitive sliding scale of insulin   Sinus tachycardia: Persist with these low-grade fevers question the possibility of an underlying clot. - Check d-dimer  Streptococcal bacteremia - Continue penicillin G - Check repeat blood cultures  Essential hypertension - Continue  amlodipine  Code Status:   full Family Communication: bedside Disposition Plan: admit   Total time spent 55 minutes.Greater than 50% of this time was spent in counseling, explanation of diagnosis, planning of further management, and coordination of care  [place] [facility] Pager [phone]  If 7PM-7AM, please contact night-coverage [email or web address] Password [email or web address] 04/13/2015, 8:01 AM

## 2015-04-13 NOTE — Progress Notes (Addendum)
Patient seen and examined. Admitted after midnight secondary to increase SOB and LE swelling. Patient with recent admission due to septic arthritis and bacteremia (on Penicillin G therapy until February 13). Presented with worsening leg swelling and SOB. No palpitations, no CP. CXR and CT angio demonstrated vascular congestion/mild pulmonary edema and no PE. Please refer to admission note written by Dr. Tamala Julian for further info/details on admission.  Of note, patient found to have Hgb in 7.0 range and very fatigue. On recent admission with positive FOBT.  Plan: -will check LE duplex -will have GI consulted -will transfuse 2 units of PRBC's and follow Hgb trend -will start lasix IV Q8H and follow daily weights/strict intake/output  -pharmacy contacted to concentrate antibiotics and decrease daily fluid infusion with therapy  Barton Dubois [phone]

## 2015-04-13 NOTE — Progress Notes (Signed)
Results of CT-angiogram of chest to rule-out pulmonary embolus text-paged to Chaney Malling, NP on-call for [facility]. No PE is noted, however, pulmonary edema is present with moderate, loculated effusions bilaterally. Large effusion of right glenohumeral joint is also seen; consistent with previous MRI.  Primary concern is for worsening cardiopulmonary status.  No significant change in patient condition since admission to floor at 02:20: Respiratory rate remains > 25 with good SPO2 95 - 98% on room air. Dyspnea at rest is still noted. Sinus tachycardia present with rates 115-125, sustained, unchanged.  Markedly elevated BP was noted on completion of first unit of blood at 02:25. Holding second unit of blood for continued signs/symptoms of fluid volume overload despite 20 mg IV lasix given after unit was completed. Patient has had good urinary output since lasix administered.  Currently receiving IV penicillin via tunneled PICC at 47.1 ml/hr. Holding other ordered fluids at this time.  Briefly discussed findings with NP by telephone, who will recommend PCCM consultation to rounding team this AM.  Continuing to closely monitor.

## 2015-04-13 NOTE — Progress Notes (Signed)
Heart Failure Navigator Consult Note  Presentation: [name] is a 50 year old female with past medical history significant for HTN, diabetes mellitus type 2, recent streptococcal bacteremia with septic arthritis of the right shoulder joint status post evacuation with placement of a right subclavian catheter to receive 4 weeks(thur 2/13 ) of penicillin G hospitalized at [facility] from 1/14 until 1/25; who presents with worsening shortness of breath. Since being discharged patient noted that she had right calf pain and swelling for which she came back to the emergency department on the 26th and was evaluated and told that it was a hematoma and there are no signs of clot. She reports continued intermittent low-grade fevers up to 11F. Husband notes that since even before she left the hospital she's been tachycardic and that her hemoglobins have been significantly low. He states that her anemia is the cause for her congestive heart failure and he wants her transfused blood until her hemoglobin is 10. In reviewing patient's discharge summary it appears she had received 2 units of blood during her previous admission and because of the guaiac positive stools she was referred to GI as outpatient. Patient was not able to follow-up with GI prior to return to the hospital today. Husband requested that GI be consulted inpatient. Patient complains of swelling of her lower extremities bilaterally. She reports having a improved appetite since being discharged.  Upon admission today patient was found to be significantly tachycardic and tachypneic. Initial BMP was elevated at 308.7. She is given a dose of Lasix: while in the ED. For the patient's anemia as her hemoglobin was noted to be 7 she was typed and screened and ordered to be transfused 2 units.  Past Medical History  Diagnosis Date  . Hypertension   . Diabetes mellitus without complication [facility])     Social History   Social History  . Marital Status:  Married    Spouse Name: N/A  . Number of Children: N/A  . Years of Education: N/A   Social History Main Topics  . Smoking status: Never Smoker   . Smokeless tobacco: None  . Alcohol Use: No  . Drug Use: No  . Sexual Activity: Not Asked   Other Topics Concern  . None   Social History Narrative    ECHO:Study Conclusions-03/29/15  - Left ventricle: The cavity size was normal. Wall thickness was increased in a pattern of mild LVH. Systolic function was normal. The estimated ejection fraction was in the range of 60% to 65%. Wall motion was normal; there were no regional wall motion abnormalities. Doppler parameters are consistent with abnormal left ventricular relaxation (grade 1 diastolic dysfunction).  Transthoracic echocardiography. M-mode, complete 2D, spectral Doppler, and color Doppler. Birthdate: Patient birthdate: 06-17-1965. Age: Patient is 50 yr old. Sex: Gender: female. BMI: 28.4 kg/m^2. Blood pressure:   141/82 Patient status: Inpatient. Study date: Study date: 03/29/2015. Study time: 09:37 AM. Location: Echo laboratory.   BNP    Component Value Date/Time   BNP 308.7* 04/12/2015 0049    ProBNP No results found for: PROBNP   Education Assessment and Provision:  Detailed education and instructions provided on heart failure disease management including the following:  Signs and symptoms of Heart Failure When to call the physician Importance of daily weights Low sodium diet Fluid restriction Medication management Anticipated future follow-up appointments  Patient education given on each of the above topics.  Patient acknowledges understanding and acceptance of all instructions.  I spoke with patient regarding her HF.  She tells me that she was not told during her last admission that she had HF and had not experienced any issues with SOB at that time.  She has not been weighing.  She does have a scale and I have reviewed with her the  importance of daily weights as well as how they relate to the signs and symptoms of HF.  We discussed a low sodium diet and high sodium foods to avoid.  She denies any issues getting or taking prescribed medications.  I have encouraged her to call me with any further questions or concerns regarding her HF.   She lives in [place] with her husband and children.  She will follow-up with her primary care physician at discharge.  Education Materials:  "Living Better With Heart Failure" Booklet, Daily Weight Tracker Tool    High Risk Criteria for Readmission and/or Poor Patient Outcomes:   EF <30%- No 60-65 % with grade 1 dias dys  2 or more admissions in 6 months- Yes--2/6 mo  Difficult social situation- No  Demonstrates medication noncompliance- denies    Barriers of Care:  New HF--Knowledge and compliance  Discharge Planning:   Plans to return to home with husband and children

## 2015-04-13 NOTE — ED Notes (Signed)
Lab contacted to consult about BNP lab result, lab tech stated that it would result soon

## 2015-04-13 NOTE — ED Notes (Signed)
Dr  Sabra Heck Notified of HGB of 7.0

## 2015-04-13 NOTE — Progress Notes (Signed)
[facility]  Patient Status: Active pt with [facility] prior to this admission  [facility] is providing the following services: HHRN and Home Infusion Pharmacy team for home PCN.  [facility] team will follow [name] while an inpatient to support DC back home when ordered.  If patient discharges after hours, please call [phone].   Larry Sierras 04/13/2015, 10:04 AM

## 2015-04-13 NOTE — Progress Notes (Signed)
Utilization review completed. Nayellie Sanseverino, RN, BSN. 

## 2015-04-13 NOTE — Care Management Note (Addendum)
Case Management Note  Patient Details  Name: [name] MRN: [id number] Date of Birth: 1965-07-28  Subjective/Objective: Pt admitted for CHF. Pt is from home with family support. Pt has a Sister and an Aunt that is home with family. Pt was previously active with [facility] for IV antibiotic therapy. CM did make [facility] aware that pt is in hospital.                    Action/Plan: Pt will need resumption orders for RN, PT, OT Services once stable. CM did call the Heart Failure [place] for HF education. CM will continue to monitor for additional disposition needs.    Expected Discharge Date:                  Expected Discharge Plan:  [place]  In-House Referral:  NA  Discharge planning Services  CM Consult  Post Acute Care Choice:  Home Health, Resumption of Svcs/PTA Provider Choice offered to:  Patient  DME Arranged:  IV pump/equipment DME Agency:  [place]:  Registered Nurse, Physical and Occupational Therapy, IV Antibiotics HH Agency:  [facility]  Status of Service:  In process, will continue to follow  Medicare Important Message Given:    Date Medicare IM Given:    Medicare IM give by:    Date Additional Medicare IM Given:    Additional Medicare Important Message give by:     If discussed at [place] of Stay Meetings, dates discussed:    Additional Comments: [place] 04-17-15 Jacqlyn Krauss, RN, BSN [phone] CM did make [facility] aware that pt is planning for d/c home today. Rx information faxed to [phone] on site Liaison [facility]. Additional order for PT Services added to [employer]. [facility] is aware. SOC to begin within 24-48 hrs of d/c. CM did check with [facility] Liaison and pt's pump was picked up on Monday- office to call pt 04-17-15 in ref to time to visit 04-17-15 for connection of IV antibiotics. No further needs from CM at this time.   Bethena Roys, RN 04/13/2015, 12:13 PM

## 2015-04-13 NOTE — Consult Note (Signed)
Reason for Consult: Symptomatic anemia Referring Physician: Hospital team  [name] is an 50 y.o. female.  HPI: Patient seen and examined and hospital computer chart reviewed and office computer chart reviewed and she really does not have any GI symptoms and has not seen any blood in a while in her stool however they are dark from the iron but she has not had any previous GI workup and her family history is negative for any GI problems and her iron studies are more compatible with chronic disease and we are asked to proceed with a colonoscopy just to be sure and she does say her shoulder and leg are bothering her but supposedly her leg was negative for a DVT and she has an appointment tomorrow with her orthopedist and 1 with my partner in a few weeks  Past Medical History  Diagnosis Date  . Hypertension   . Diabetes mellitus without complication [facility])     Past Surgical History  Procedure Laterality Date  . Breast surgery    . Uterine fibroid surgery    . Left oophorectomy Left   . I&d extremity Right 03/30/2015    Procedure: IRRIGATION AND DEBRIDEMENT EXTREMITY;  Surgeon: Renette Butters, MD;  Location: [place];  Service: Orthopedics;  Laterality: Right;  . Shoulder arthroscopy Right 03/30/2015    Procedure: ARTHROSCOPY SHOULDER;  Surgeon: Renette Butters, MD;  Location: [place];  Service: Orthopedics;  Laterality: Right;    History reviewed. No pertinent family history.  Social History:  reports that she has never smoked. She does not have any smokeless tobacco history on file. She reports that she does not drink alcohol or use illicit drugs.  Allergies: No Known Allergies  Medications: I have reviewed the patient's current medications.  Results for orders placed or performed during the hospital encounter of 04/12/15 (from the past 48 hour(s))  Brain natriuretic peptide     Status: Abnormal   Collection Time: 04/12/15 12:49 AM  Result Value Ref Range   B Natriuretic Peptide  308.7 (H) 0.0 - 100.0 pg/mL  CBC with Differential     Status: Abnormal   Collection Time: 04/12/15  9:53 PM  Result Value Ref Range   WBC 9.9 4.0 - 10.5 K/uL   RBC 2.55 (L) 3.87 - 5.11 MIL/uL   Hemoglobin 7.0 (L) 12.0 - 15.0 g/dL   HCT 21.6 (L) 36.0 - 46.0 %   MCV 84.7 78.0 - 100.0 fL   MCH 27.5 26.0 - 34.0 pg   MCHC 32.4 30.0 - 36.0 g/dL   RDW 15.7 (H) 11.5 - 15.5 %   Platelets 474 (H) 150 - 400 K/uL   Neutrophils Relative % 77 %   Neutro Abs 7.6 1.7 - 7.7 K/uL   Lymphocytes Relative 13 %   Lymphs Abs 1.3 0.7 - 4.0 K/uL   Monocytes Relative 8 %   Monocytes Absolute 0.8 0.1 - 1.0 K/uL   Eosinophils Relative 1 %   Eosinophils Absolute 0.1 0.0 - 0.7 K/uL   Basophils Relative 1 %   Basophils Absolute 0.1 0.0 - 0.1 K/uL  Comprehensive metabolic panel     Status: Abnormal   Collection Time: 04/12/15  9:53 PM  Result Value Ref Range   Sodium 137 135 - 145 mmol/L   Potassium 3.9 3.5 - 5.1 mmol/L   Chloride 104 101 - 111 mmol/L   CO2 22 22 - 32 mmol/L   Glucose, Bld 267 (H) 65 - 99 mg/dL   BUN 11 6 -  20 mg/dL   Creatinine, Ser 1.01 (H) 0.44 - 1.00 mg/dL   Calcium 8.7 (L) 8.9 - 10.3 mg/dL   Total Protein 7.6 6.5 - 8.1 g/dL   Albumin 1.8 (L) 3.5 - 5.0 g/dL   AST 20 15 - 41 U/L   ALT 14 14 - 54 U/L   Alkaline Phosphatase 80 38 - 126 U/L   Total Bilirubin 0.6 0.3 - 1.2 mg/dL   GFR calc non Af Amer >60 >60 mL/min   GFR calc Af Amer >60 >60 mL/min    Comment: (NOTE) The eGFR has been calculated using the CKD EPI equation. This calculation has not been validated in all clinical situations. eGFR's persistently <60 mL/min signify possible Chronic Kidney Disease.    Anion gap 11 5 - 15  I-Stat Troponin, ED (not at [facility])     Status: None   Collection Time: 04/12/15  9:54 PM  Result Value Ref Range   Troponin i, poc 0.00 0.00 - 0.08 ng/mL   Comment 3            Comment: Due to the release kinetics of cTnI, a negative result within the first hours of the onset of symptoms does not  rule out myocardial infarction with certainty. If myocardial infarction is still suspected, repeat the test at appropriate intervals.   I-Stat CG4 Lactic Acid, ED     Status: None   Collection Time: 04/12/15  9:56 PM  Result Value Ref Range   Lactic Acid, Venous 0.75 0.5 - 2.0 mmol/L  Blood culture (routine x 2)     Status: None (Preliminary result)   Collection Time: 04/12/15 10:30 PM  Result Value Ref Range   Specimen Description BLOOD LEFT ARM    Special Requests BOTTLES DRAWN AEROBIC AND ANAEROBIC 5CC    Culture NO GROWTH < 12 HOURS    Report Status PENDING   Blood culture (routine x 2)     Status: None (Preliminary result)   Collection Time: 04/12/15 10:39 PM  Result Value Ref Range   Specimen Description BLOOD LEFT HAND    Special Requests BOTTLES DRAWN AEROBIC AND ANAEROBIC 5CC    Culture NO GROWTH < 12 HOURS    Report Status PENDING   Type and screen     Status: None (Preliminary result)   Collection Time: 04/12/15 10:47 PM  Result Value Ref Range   ABO/RH(D) O POS    Antibody Screen NEG    Sample Expiration 04/15/2015    Unit Number [id number]    Blood Component Type RED CELLS,LR    Unit division 00    Status of Unit ISSUED    Transfusion Status OK TO TRANSFUSE    Crossmatch Result Compatible    Unit Number [id number]    Blood Component Type RBC LR PHER2    Unit division 00    Status of Unit ALLOCATED    Transfusion Status OK TO TRANSFUSE    Crossmatch Result Compatible   Urinalysis, Routine w reflex microscopic (not at [facility])     Status: Abnormal   Collection Time: 04/12/15 11:28 PM  Result Value Ref Range   Color, Urine STRAW (A) YELLOW   APPearance CLOUDY (A) CLEAR   Specific Gravity, Urine 1.007 1.005 - 1.030   pH 6.5 5.0 - 8.0   Glucose, UA 250 (A) NEGATIVE mg/dL   Hgb urine dipstick LARGE (A) NEGATIVE   Bilirubin Urine NEGATIVE NEGATIVE   Ketones, ur NEGATIVE NEGATIVE mg/dL   Protein, ur NEGATIVE NEGATIVE mg/dL  Nitrite NEGATIVE NEGATIVE    Leukocytes, UA NEGATIVE NEGATIVE  Urine microscopic-add on     Status: Abnormal   Collection Time: 04/12/15 11:28 PM  Result Value Ref Range   Squamous Epithelial / LPF 0-5 (A) NONE SEEN   WBC, UA 0-5 0 - 5 WBC/hpf   RBC / HPF 6-30 0 - 5 RBC/hpf   Bacteria, UA FEW (A) NONE SEEN   Casts HYALINE CASTS (A) NEGATIVE  Creatinine, urine, random     Status: None   Collection Time: 04/12/15 11:28 PM  Result Value Ref Range   Creatinine, Urine 26.79 mg/dL  Protein, urine, random     Status: None   Collection Time: 04/12/15 11:28 PM  Result Value Ref Range   Total Protein, Urine 35 mg/dL    Comment: NO NORMAL RANGE ESTABLISHED FOR THIS TEST  Prepare RBC     Status: None   Collection Time: 04/12/15 11:58 PM  Result Value Ref Range   Order Confirmation ORDER PROCESSED BY BLOOD BANK   D-dimer, quantitative (not at [facility])     Status: Abnormal   Collection Time: 04/13/15  1:23 AM  Result Value Ref Range   D-Dimer, Quant 8.95 (H) 0.00 - 0.50 ug/mL-FEU    Comment: (NOTE) At the manufacturer cut-off of 0.50 ug/mL FEU, this assay has been documented to exclude PE with a sensitivity and negative predictive value of 97 to 99%.  At this time, this assay has not been approved by the FDA to exclude DVT/VTE. Results should be correlated with clinical presentation.   MRSA PCR Screening     Status: None   Collection Time: 04/13/15  2:22 AM  Result Value Ref Range   MRSA by PCR NEGATIVE NEGATIVE    Comment:        The GeneXpert MRSA Assay (FDA approved for NASAL specimens only), is one component of a comprehensive MRSA colonization surveillance program. It is not intended to diagnose MRSA infection nor to guide or monitor treatment for MRSA infections.   Glucose, capillary     Status: Abnormal   Collection Time: 04/13/15  7:33 AM  Result Value Ref Range   Glucose-Capillary 189 (H) 65 - 99 mg/dL  T4, free     Status: Abnormal   Collection Time: 04/13/15  7:50 AM  Result Value Ref Range    Free T4 1.53 (H) 0.61 - 1.12 ng/dL  CBC     Status: Abnormal   Collection Time: 04/13/15  8:16 AM  Result Value Ref Range   WBC 7.6 4.0 - 10.5 K/uL   RBC 2.89 (L) 3.87 - 5.11 MIL/uL   Hemoglobin 7.9 (L) 12.0 - 15.0 g/dL   HCT 24.3 (L) 36.0 - 46.0 %   MCV 84.1 78.0 - 100.0 fL   MCH 27.3 26.0 - 34.0 pg   MCHC 32.5 30.0 - 36.0 g/dL   RDW 15.2 11.5 - 15.5 %   Platelets 415 (H) 150 - 400 K/uL  Glucose, capillary     Status: Abnormal   Collection Time: 04/13/15 11:29 AM  Result Value Ref Range   Glucose-Capillary 165 (H) 65 - 99 mg/dL    Ct Angio Chest Pe W/cm &/or Wo Cm  04/13/2015  CLINICAL DATA:  Shortness of breath and elevated D-dimer. EXAM: CT ANGIOGRAPHY CHEST WITH CONTRAST TECHNIQUE: Multidetector CT imaging of the chest was performed using the standard protocol during bolus administration of intravenous contrast. Multiplanar CT image reconstructions and MIPs were obtained to evaluate the vascular anatomy. CONTRAST:  49m  OMNIPAQUE IOHEXOL 350 MG/ML SOLN COMPARISON:  None. FINDINGS: THORACIC INLET/BODY WALL: Body wall edema. Tunneled right IJ line with tip at the upper cavoatrial junction. MEDIASTINUM: Normal heart size. No pericardial effusion. No acute vascular abnormality including pulmonary embolism or aortic dissection. No adenopathy. LUNG WINDOWS: There is symmetric central ground-glass opacity with diffuse interlobular septal thickening. Small borderline moderate pleural effusions with fissural extension on the right and mild lung scalloping suggesting early loculation. UPPER ABDOMEN: No acute findings. OSSEOUS: Large right glenohumeral joint effusion which is partially visualized, known based on previous MRI. There is persistent expansion and edematous appearance of the rotator cuff muscles. No erosive changes seen in the visualized joint. 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 Mass measures 4 cm in diameter and 8 cm in  length. Review of the MIP images confirms the above findings. IMPRESSION: 1. Negative for pulmonary embolism. 2. Pulmonary edema. Small to moderate bilateral pleural effusion with signs of early loculation. Mild atelectasis. 3. Septic arthritis of the right glenohumeral joint with large effusion that is grossly similar to 04/06/2015 MRI. Electronically Signed   By: Monte Fantasia M.D.   On: 04/13/2015 05:27   Dg Chest Port 1 View  04/12/2015  CLINICAL DATA:  Shortness of breath EXAM: PORTABLE CHEST 1 VIEW COMPARISON:  04/08/2014 FINDINGS: Right-sided central line in stable position with tip at the SVC level. Normal heart size for technique.  Stable aortic and hilar contours. Layering pleural effusions causing hazy basilar opacity. Diffuse interstitial coarsening with cephalized blood flow. IMPRESSION: Mild pulmonary edema with layering pleural effusions. Electronically Signed   By: Monte Fantasia M.D.   On: 04/12/2015 22:39    ROS negative except above Blood pressure 176/94, pulse 116, temperature 98.7 F (37.1 C), temperature source Oral, resp. rate 18, height 5' 6"  (1.676 m), weight 83.28 kg (183 lb 9.6 oz), SpO2 98 %. Physical Exam vital signs stable afebrile no acute distress lungs are clear heart regular rate and rhythm abdomen is soft nontender right greater than left leg edema labs and x-rays reviewed  Assessment/Plan: Multiple medical problems including anemia probably of chronic disease but due for colonic screening Plan: I offered her inpatient versus outpatient colonoscopy and the risks benefits methods of that was discussed and will proceed tomorrow at 1:30 with further workup and plans pending those findings  [place] E 04/13/2015, 1:05 PM

## 2015-04-14 ENCOUNTER — Encounter (HOSPITAL_COMMUNITY)
Admission: EM | Disposition: A | Discharge status: Home Health | Payer: Self-pay | Source: Home / Self Care | Attending: Internal Medicine

## 2015-04-14 ENCOUNTER — Inpatient Hospital Stay (HOSPITAL_COMMUNITY): Payer: Managed Care, Other (non HMO) | Admitting: Anesthesiology

## 2015-04-14 ENCOUNTER — Inpatient Hospital Stay (HOSPITAL_COMMUNITY): Payer: Managed Care, Other (non HMO)

## 2015-04-14 ENCOUNTER — Encounter (HOSPITAL_COMMUNITY): Payer: Self-pay | Admitting: General Practice

## 2015-04-14 DIAGNOSIS — I5031 Acute diastolic (congestive) heart failure: Secondary | ICD-10-CM

## 2015-04-14 DIAGNOSIS — R609 Edema, unspecified: Secondary | ICD-10-CM

## 2015-04-14 DIAGNOSIS — D649 Anemia, unspecified: Secondary | ICD-10-CM

## 2015-04-14 DIAGNOSIS — I10 Essential (primary) hypertension: Secondary | ICD-10-CM

## 2015-04-14 HISTORY — PX: COLONOSCOPY WITH PROPOFOL: SHX5780

## 2015-04-14 LAB — BASIC METABOLIC PANEL
ANION GAP: 12 (ref 5–15)
BUN: 8 mg/dL (ref 6–20)
CHLORIDE: 104 mmol/L (ref 101–111)
CO2: 23 mmol/L (ref 22–32)
Calcium: 8.4 mg/dL — ABNORMAL LOW (ref 8.9–10.3)
Creatinine, Ser: 1.06 mg/dL — ABNORMAL HIGH (ref 0.44–1.00)
Glucose, Bld: 159 mg/dL — ABNORMAL HIGH (ref 65–99)
POTASSIUM: 3.4 mmol/L — AB (ref 3.5–5.1)
SODIUM: 139 mmol/L (ref 135–145)

## 2015-04-14 LAB — TYPE AND SCREEN
ABO/RH(D): O POS
ANTIBODY SCREEN: NEGATIVE
Unit division: 0
Unit division: 0

## 2015-04-14 LAB — GLUCOSE, CAPILLARY
GLUCOSE-CAPILLARY: 147 mg/dL — AB (ref 65–99)
GLUCOSE-CAPILLARY: 154 mg/dL — AB (ref 65–99)
GLUCOSE-CAPILLARY: 155 mg/dL — AB (ref 65–99)
Glucose-Capillary: 158 mg/dL — ABNORMAL HIGH (ref 65–99)
Glucose-Capillary: 193 mg/dL — ABNORMAL HIGH (ref 65–99)
Glucose-Capillary: 164 mg/dL — ABNORMAL HIGH (ref 65–99)

## 2015-04-14 LAB — URINE CULTURE: Culture: NO GROWTH

## 2015-04-14 SURGERY — COLONOSCOPY WITH PROPOFOL
Anesthesia: Monitor Anesthesia Care

## 2015-04-14 MED ORDER — LABETALOL HCL 200 MG PO TABS
300.0000 mg | ORAL_TABLET | Freq: Three times a day (TID) | ORAL | Status: DC
  Administered 2015-04-14 – 2015-04-17 (×10): 300 mg via ORAL
  Filled 2015-04-14 (×10): qty 1

## 2015-04-14 MED ORDER — PROPOFOL 500 MG/50ML IV EMUL
INTRAVENOUS | Status: DC | PRN
  Administered 2015-04-14: 75 ug/kg/min via INTRAVENOUS

## 2015-04-14 MED ORDER — LACTATED RINGERS IV SOLN
INTRAVENOUS | Status: DC | PRN
  Administered 2015-04-14: 14:00:00 via INTRAVENOUS

## 2015-04-14 MED ORDER — POTASSIUM CHLORIDE CRYS ER 20 MEQ PO TBCR
40.0000 meq | EXTENDED_RELEASE_TABLET | Freq: Once | ORAL | Status: AC
Start: 2015-04-14 — End: 2015-04-14
  Administered 2015-04-14: 40 meq via ORAL
  Filled 2015-04-14: qty 2

## 2015-04-14 MED ORDER — PROPOFOL 10 MG/ML IV BOLUS
INTRAVENOUS | Status: DC | PRN
  Administered 2015-04-14: 20 mg via INTRAVENOUS

## 2015-04-14 MED ORDER — FENTANYL CITRATE (PF) 100 MCG/2ML IJ SOLN
INTRAMUSCULAR | Status: DC | PRN
Start: 2015-04-14 — End: 2015-04-14
  Administered 2015-04-14 (×2): 50 ug via INTRAVENOUS

## 2015-04-14 MED ORDER — ACETAMINOPHEN 325 MG PO TABS: 325.0000 mg | ORAL_TABLET | Freq: Four times a day (QID) | ORAL | Status: DC | PRN

## 2015-04-14 MED ORDER — LISINOPRIL 10 MG PO TABS
10.0000 mg | ORAL_TABLET | Freq: Every day | ORAL | Status: DC
  Administered 2015-04-14 – 2015-04-15 (×2): 10 mg via ORAL
  Filled 2015-04-14 (×2): qty 1

## 2015-04-14 NOTE — Anesthesia Preprocedure Evaluation (Signed)
Anesthesia Evaluation  Patient identified by MRN, date of birth, ID band Patient awake    Reviewed: Allergy & Precautions, NPO status , Patient's Chart, lab work & pertinent test results, reviewed documented beta blocker date and time   History of Anesthesia Complications Negative for: history of anesthetic complications  Airway Mallampati: II  TM Distance: >3 FB Neck ROM: Full    Dental  (+) Teeth Intact   Pulmonary neg shortness of breath, neg sleep apnea, neg COPD, neg recent URI, neg PE   breath sounds clear to auscultation       Cardiovascular hypertension, Pt. on medications and Pt. on home beta blockers (-) angina+CHF  (-) Past MI  Rhythm:Regular     Neuro/Psych negative neurological ROS     GI/Hepatic Neg liver ROS,   Endo/Other  diabetes  Renal/GU Renal InsufficiencyRenal disease     Musculoskeletal  (+) Arthritis ,   Abdominal   Peds  Hematology  (+) anemia ,   Anesthesia Other Findings   Reproductive/Obstetrics                             Anesthesia Physical Anesthesia Plan  ASA: III  Anesthesia Plan: MAC   Post-op Pain Management:    Induction: Intravenous  Airway Management Planned: Nasal Cannula, Natural Airway and Simple Face Mask  Additional Equipment: None  Intra-op Plan:   Post-operative Plan:   Informed Consent: I have reviewed the patients History and Physical, chart, labs and discussed the procedure including the risks, benefits and alternatives for the proposed anesthesia with the patient or authorized representative who has indicated his/her understanding and acceptance.   Dental advisory given  Plan Discussed with: CRNA and Surgeon  Anesthesia Plan Comments:         Anesthesia Quick Evaluation

## 2015-04-14 NOTE — Progress Notes (Signed)
[name] 2:09 PM  Subjective: Patient without any new complaints and supposedly tolerated her prep without any obvious bleeding at the end   Objective:  vital signs stable afebrile no acute distress exam please see preassessment evaluation no new CBC  Assessment:  guaiac positive anemia probably of chronic disease  Plan:  okay to proceed with colonoscopy with anesthesia assistance  [facility] E  Pager [phone] After 5PM or if no answer call [phone]

## 2015-04-14 NOTE — Progress Notes (Signed)
VASCULAR LAB PRELIMINARY  PRELIMINARY  PRELIMINARY  PRELIMINARY   Bilateral lower extremity venous Doppler has been completed.  Bilateral:  No evidence of DVT or superficial thrombosis.  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    Janifer Adie, RVT, RDMS 04/14/2015, 10:33 AM

## 2015-04-14 NOTE — Progress Notes (Addendum)
[facility] PROGRESS NOTE  [name] HZ:[id number] DOB: January 21, 1966 DOA: 04/12/2015 PCP: [name], MD  Assessment/Plan:  Principal Problem:   Acute diastolic congestive heart failure ([place]): echocardiogram 2 weeks ago with normal EF, grad 1 diastolic dysfunction. No previous history of heart failure. Suspect related to fluid overload from recent hospitalization for bacteremia, as well as ongoing salt load from antibiotics. No need to repeat echocardiogram. TSH normal. Weight on January 14 was about 175 pounds. At one point during previous hospitalization, was 194 pounds. Today, 181 pounds, down from 185 pounds. Will continue IV diuresis. Monitor daily electrolytes. May have been worsened by anemia, but patient's hemoglobin ranged 89 during last hospitalization, so suspect this is less of a contributing factor. Active Problems: Acute on chronic   Symptomatic anemia: With heme positive stool on previous admission and low iron levels: For colonoscopy today. No hemoglobin today. Will recheck tomorrow. Suspect an element of chronic disease.   Streptococcal bacteremia: Continue penicillin   Diabetes mellitus ([place]): Diet controlled. Last hemoglobin A1c was 7.0. Patient reports, previously has been below 6.   Sinus tachycardia ([place])   Essential hypertension: Uncontrolled. Weil adjust antihypertensives.   SOB (shortness of breath) secondary to anemia and acute heart failure.   right greater than leg pain and swelling: Likely related to both heart failure and Baker's cyst. DVT ruled out. Should improve with diuresis. If not, may f/u ortho as outpt when she has her appointment for shoulder. Will get pt eval   went over all test results with patient and wrote them down so she may share with her husband who is a physician.  HPI/Subjective:  complaining of right greater than left leg pain particularly with weightbearing. Shortness of breath improved. No bleeding.   Objective: Filed  Vitals:   04/14/15 0347 04/14/15 0700  BP: 158/86 168/94  Pulse: 105 116  Temp: 98.8 F (37.1 C) 98.5 F (36.9 C)  Resp: 22 24    Intake/Output Summary (Last 24 hours) at 04/14/15 1111 Last data filed at 04/14/15 0815  Gross per 24 hour  Intake   4953 ml  Output   1556 ml  Net   3397 ml   Filed Weights   04/13/15 0236 04/13/15 0915 04/14/15 0347  Weight: 83.054 kg (183 lb 1.6 oz) 83.28 kg (183 lb 9.6 oz) 82.101 kg (181 lb)    Exam:   General:  Alert, oriented. Breathing nonlabored.   Neck: JVD present.   Cardiovascular:  regular rate rhythm without murmurs gallops rubs   Respiratory:  clear to auscultation bilaterally without wheezes rhonchi or rales   Abdomen:  soft nontender nondistended   Ext:  right greater than left leg edema.  Basic Metabolic Panel:  Recent Labs Lab 04/08/15 2000 04/12/15 2153 04/14/15 0641  NA 134* 137 139  K 4.4 3.9 3.4*  CL 100* 104 104  CO2 22 22 23   GLUCOSE 220* 267* 159*  BUN 15 11 8   CREATININE 1.20* 1.01* 1.06*  CALCIUM 8.5* 8.7* 8.4*   Liver Function Tests:  Recent Labs Lab 04/08/15 2245 04/12/15 2153  AST 21 20  ALT 14 14  ALKPHOS 71 80  BILITOT 0.3 0.6  PROT 7.4 7.6  ALBUMIN 1.6* 1.8*   No results for input(s): LIPASE, AMYLASE in the last 168 hours. No results for input(s): AMMONIA in the last 168 hours. CBC:  Recent Labs Lab 04/08/15 2000 04/12/15 2153 04/13/15 0816 04/13/15 2021  WBC 11.0* 9.9 7.6 9.0  NEUTROABS 9.6* 7.6  --   --  HGB 7.9* 7.0* 7.9* 8.5*  HCT 23.9* 21.6* 24.3* 26.6*  MCV 84.5 84.7 84.1 84.7  PLT 530* 474* 415* 380   Cardiac Enzymes: No results for input(s): CKTOTAL, CKMB, CKMBINDEX, TROPONINI in the last 168 hours. BNP (last 3 results)  Recent Labs  04/08/15 2246 04/12/15 0049  BNP 259.3* 308.7*    ProBNP (last 3 results) No results for input(s): PROBNP in the last 8760 hours.  CBG:  Recent Labs Lab 04/13/15 1635 04/13/15 2032 04/14/15 0024 04/14/15 0353  04/14/15 0749  GLUCAP 138* 151* 154* 155* 147*    Recent Results (from the past 240 hour(s))  Culture, routine-abscess     Status: None   Collection Time: 04/09/15  4:01 AM  Result Value Ref Range Status   Specimen Description ABSCESS  Final   Special Requests RIGHT CALF  Final   Gram Stain   Final    ABUNDANT WBC PRESENT,BOTH PMN AND MONONUCLEAR NO SQUAMOUS EPITHELIAL CELLS SEEN NO ORGANISMS SEEN Performed at [facility]    Culture   Final    NO GROWTH 2 DAYS Performed at [facility]    Report Status 04/11/2015 FINAL  Final  Blood culture (routine x 2)     Status: None (Preliminary result)   Collection Time: 04/12/15 10:30 PM  Result Value Ref Range Status   Specimen Description BLOOD LEFT ARM  Final   Special Requests BOTTLES DRAWN AEROBIC AND ANAEROBIC 5CC  Final   Culture NO GROWTH < 12 HOURS  Final   Report Status PENDING  Incomplete  Blood culture (routine x 2)     Status: None (Preliminary result)   Collection Time: 04/12/15 10:39 PM  Result Value Ref Range Status   Specimen Description BLOOD LEFT HAND  Final   Special Requests BOTTLES DRAWN AEROBIC AND ANAEROBIC 5CC  Final   Culture NO GROWTH < 12 HOURS  Final   Report Status PENDING  Incomplete  MRSA PCR Screening     Status: None   Collection Time: 04/13/15  2:22 AM  Result Value Ref Range Status   MRSA by PCR NEGATIVE NEGATIVE Final    Comment:        The GeneXpert MRSA Assay (FDA approved for NASAL specimens only), is one component of a comprehensive MRSA colonization surveillance program. It is not intended to diagnose MRSA infection nor to guide or monitor treatment for MRSA infections.      Studies: Ct Angio Chest Pe W/cm &/or Wo Cm  04/13/2015  CLINICAL DATA:  Shortness of breath and elevated D-dimer. EXAM: CT ANGIOGRAPHY CHEST WITH CONTRAST TECHNIQUE: Multidetector CT imaging of the chest was performed using the standard protocol during bolus administration of intravenous  contrast. Multiplanar CT image reconstructions and MIPs were obtained to evaluate the vascular anatomy. CONTRAST:  36mL OMNIPAQUE IOHEXOL 350 MG/ML SOLN COMPARISON:  None. FINDINGS: THORACIC INLET/BODY WALL: Body wall edema. Tunneled right IJ line with tip at the upper cavoatrial junction. MEDIASTINUM: Normal heart size. No pericardial effusion. No acute vascular abnormality including pulmonary embolism or aortic dissection. No adenopathy. LUNG WINDOWS: There is symmetric central ground-glass opacity with diffuse interlobular septal thickening. Small borderline moderate pleural effusions with fissural extension on the right and mild lung scalloping suggesting early loculation. UPPER ABDOMEN: No acute findings. OSSEOUS: Large right glenohumeral joint effusion which is partially visualized, known based on previous MRI. There is persistent expansion and edematous appearance of the rotator cuff muscles. No erosive changes seen in the visualized joint. Fatty mass in the  left intrinsic back muscles with simple internal architecture, compatible with lipoma. Mass measures 4 cm in diameter and 8 cm in length. Review of the MIP images confirms the above findings. IMPRESSION: 1. Negative for pulmonary embolism. 2. Pulmonary edema. Small to moderate bilateral pleural effusion with signs of early loculation. Mild atelectasis. 3. Septic arthritis of the right glenohumeral joint with large effusion that is grossly similar to 04/06/2015 MRI. Electronically Signed   By: Monte Fantasia M.D.   On: 04/13/2015 05:27   Dg Chest Port 1 View  04/12/2015  CLINICAL DATA:  Shortness of breath EXAM: PORTABLE CHEST 1 VIEW COMPARISON:  04/08/2014 FINDINGS: Right-sided central line in stable position with tip at the SVC level. Normal heart size for technique.  Stable aortic and hilar contours. Layering pleural effusions causing hazy basilar opacity. Diffuse interstitial coarsening with cephalized blood flow. IMPRESSION: Mild pulmonary edema  with layering pleural effusions. Electronically Signed   By: Monte Fantasia M.D.   On: 04/12/2015 22:39    Scheduled Meds: . sodium chloride   Intravenous Once  . amLODipine  10 mg Oral Daily  . furosemide  40 mg Intravenous 3 times per day  . insulin aspart  0-9 Units Subcutaneous 6 times per day  . labetalol  200 mg Oral TID  . lisinopril  5 mg Oral Daily  . pantoprazole (PROTONIX) IV  40 mg Intravenous Q12H  . penicillin g continuous IV infusion  12 Million Units Intravenous Q12H  . potassium chloride  40 mEq Oral Once  . sodium chloride flush  10-40 mL Intracatheter Q12H  . sodium chloride flush  3 mL Intravenous Q12H   Continuous Infusions: . sodium chloride Stopped (04/13/15 1517)    Time spent: 35 minutes  [facility] [email or web address], password [facility] 04/14/2015, 11:11 AM  LOS: 1 day

## 2015-04-14 NOTE — Clinical Documentation Improvement (Signed)
Internal Medicine  Can the diagnosis of CHF be further specified?    Acuity - Acute, Chronic, Acute on Chronic   Type - Systolic, Diastolic, Systolic and Diastolic  Other  Clinically Undetermined  Document any associated diagnoses/conditions Please update your documentation within the medical record to reflect your response to this query. Thank you.  Supporting Information: (As per notes) "Acute congestive heart failure [facility]): Acute patient with note of progressively worsening lower extremity edema and shortness of breath. BNP found to be elevated at 308.7. Last echocardiogram showed EF of 60-65% in 03/2015. Patient was given 40 mg of IV Lasix while in the ED. CXR and CT angio demonstrated vascular congestion/mild pulmonary" edema   LABS: Component     Latest Ref Rng 04/12/2015  B Natriuretic Peptide     0.0 - 100.0 pg/mL 308.7 (H)   Please exercise your independent, professional judgment when responding. A specific answer is not anticipated or expected.  Thank You, Alessandra Grout, RN, BSN, CCDS,Clinical Documentation Specialist:  [phone]  [phone]=[facility] [place]- Health Information Management

## 2015-04-14 NOTE — Plan of Care (Signed)
Problem: Fluid Volume: Goal: Ability to maintain a balanced intake and output will improve Outcome: Adequate for Discharge Pt following recommendations/CHF teaching to prevent fluid volume overload

## 2015-04-14 NOTE — Op Note (Addendum)
[facility] [place], [address]   COLONOSCOPY PROCEDURE REPORT     EXAM DATE: 04/14/2015  PATIENT NAME:      [name], [name]           MR #:      [id number]  BIRTHDATE:       1965-09-14      VISIT #:     [id number]  ATTENDING:     Clarene Essex, MD     STATUS:     outpatient ASSISTANT:      William Dalton and Carlyn Reichert  INDICATIONS:  The patient is a 50 yr old female here for a colonoscopy due to average risk patient for colon cancer, anemia, non-specific, and heme-positive stool. PROCEDURE PERFORMED:     Colonoscopy with biopsy MEDICATIONS:     Propofol 175 mg IV and Fentanyl 100 mcg IV ESTIMATED BLOOD LOSS:     [name]  CONSENT: The patient understands the risks and benefits of the procedure and understands that these risks include, but are not limited to: sedation, allergic reaction, infection, perforation and/or bleeding. Alternative means of evaluation and treatment include, among others: physical exam, x-rays, and/or surgical intervention. The patient elects to proceed with this endoscopic procedure.  DESCRIPTION OF PROCEDURE: During intra-op preparation period all mechanical & medical equipment was checked for proper function. Hand hygiene and appropriate measures for infection prevention was taken. After the risks, benefits and alternatives of the procedure were thoroughly explained, Informed consent was verified, confirmed and timeout was successfully executed by the treatment team. A digital exam revealed external hemorrhoids. The Pentax Adult Colon [id number] endoscope was introduced through the anus and advanced to the ileum.this required abdominal pressure in the prep was  adequate The instrument was then slowly withdrawn as the colon was fully examined.Estimated blood loss is zero unless otherwise noted in this procedure report. findings are recorded below      Retroflexed views revealed internal hemorrhoids. The  scope was then completely withdrawn from the patient and the procedure terminated. SCOPE WITHDRAWAL TIME: See nurse's note    ADVERSE EVENTS:      There were no immediate complications.  IMPRESSIONS:     1. Small internal and external hemorrhoids 2. Tiny transverse polyp cold biopsy 3. Otherwise within normal limits to the end of the terminal ileum without signs of bleeding  RECOMMENDATIONS:     await pathology no further GI workup at this time happy see back when necessary if adenomatous repeat colonoscopy in 5 years otherwise 10 RECALL:     as above  _____________________________ Clarene Essex, MD eSigned:  Clarene Essex, MD 04/14/2015 2:51 PM Revised: 04/14/2015 2:51 PM  cc:  Seward Carol, M.D.   CPT CODES: ICD CODES:  The ICD and CPT codes recommended by this software are interpretations from the data that the clinical staff has captured with the software.  The verification of the translation of this report to the ICD and CPT codes and modifiers is the sole responsibility of the health care institution and practicing physician where this report was generated.  [employer] will not be held responsible for the validity of the ICD and CPT codes included on this report.  AMA assumes no liability for data contained or not contained herein. CPT is a Designer, television/film set of the [employer].   PATIENT NAME:  [name] MR#: [id number]

## 2015-04-14 NOTE — Anesthesia Postprocedure Evaluation (Signed)
Anesthesia Post Note  Patient: [name]  Procedure(s) Performed: Procedure(s) (LRB): COLONOSCOPY WITH PROPOFOL (N/A)  Patient location during evaluation: Endoscopy Anesthesia Type: MAC Level of consciousness: awake Pain management: pain level controlled Vital Signs Assessment: post-procedure vital signs reviewed and stable Respiratory status: spontaneous breathing Cardiovascular status: stable Postop Assessment: no signs of nausea or vomiting Anesthetic complications: no    Last Vitals:  Filed Vitals:   04/14/15 1349 04/14/15 1446  BP: 188/93 163/97  Pulse: 106 104  Temp: 38.1 C 37.1 C  Resp: 19 23    Last Pain:  Filed Vitals:   04/14/15 1448  PainSc: 4                  Yazan Gatling

## 2015-04-14 NOTE — Anesthesia Procedure Notes (Signed)
Procedure Name: MAC Date/Time: 04/14/2015 2:20 PM Performed by: Eligha Bridegroom Pre-anesthesia Checklist: Patient identified, Timeout performed, Emergency Drugs available, Suction available and Patient being monitored Patient Re-evaluated:Patient Re-evaluated prior to inductionOxygen Delivery Method: Nasal cannula Intubation Type: IV induction

## 2015-04-14 NOTE — Transfer of Care (Signed)
Immediate Anesthesia Transfer of Care Note  Patient: [name]  Procedure(s) Performed: Procedure(s): COLONOSCOPY WITH PROPOFOL (N/A)  Patient Location: PACU and Endoscopy Unit  Anesthesia Type:MAC  Level of Consciousness: awake, alert  and oriented  Airway & Oxygen Therapy: Patient Spontanous Breathing and Patient connected to nasal cannula oxygen  Post-op Assessment: Report given to RN and Post -op Vital signs reviewed and stable  Post vital signs: Reviewed and stable  Last Vitals:  Filed Vitals:   04/14/15 1349 04/14/15 1446  BP: 188/93 163/97  Pulse: 106 104  Temp: 38.1 C 37.1 C  Resp: 19 23    Complications: No apparent anesthesia complications

## 2015-04-14 NOTE — Plan of Care (Signed)
Problem: Activity: Goal: Capacity to carry out activities will improve Outcome: Progressing Patient states she is feeling "much better" post-transfusion. Appears less acutely ill on physical assessment also. Respiratory rates are down significantly and no evidence of dyspnea at rest. Still very activity intolerant with frequent trips to bedside commode on colonoscopy prep. Bowel movements are loose, watery, dark-green at last check around 00:30; due for reassessment. Procedure time around 13:30 tomorrow; will continue prep overnight and monitor for clearing of stool.  Vital signs still showing elevated BP and sinus tachycardia, though both are improved: Filed Vitals:    04/13/15 1805 04/13/15 2028 04/13/15 2056 04/14/15 0019  BP: 154/89 173/91 173/91 156/81  Pulse:   106 107 106  Temp: 98.9 F (37.2 C)   99.5 F (37.5 C) 98.7 F (37.1 C)  TempSrc: Oral   Oral Oral  Resp: 24 27 27 19   Height:          Weight:          SpO2: 99% 100% 100% 99%    Discussed basic plan of care and GI prep with patient at time of physical assessment earlier this shift. Patient clearly physically exhausted at 00:30 round; unable to educate further at that time related to her status. No substantive education done to this point. Will need to address when patient is more alert.  Continuing to monitor.

## 2015-04-15 ENCOUNTER — Inpatient Hospital Stay (HOSPITAL_COMMUNITY): Payer: Managed Care, Other (non HMO)

## 2015-04-15 ENCOUNTER — Encounter (HOSPITAL_COMMUNITY): Payer: Self-pay | Admitting: Gastroenterology

## 2015-04-15 LAB — CBC
HCT: 24.3 % — ABNORMAL LOW (ref 36.0–46.0)
Hemoglobin: 8 g/dL — ABNORMAL LOW (ref 12.0–15.0)
MCH: 28.2 pg (ref 26.0–34.0)
MCHC: 32.9 g/dL (ref 30.0–36.0)
MCV: 85.6 fL (ref 78.0–100.0)
Platelets: 297 10*3/uL (ref 150–400)
RBC: 2.84 MIL/uL — ABNORMAL LOW (ref 3.87–5.11)
RDW: 15.4 % (ref 11.5–15.5)
WBC: 8.4 10*3/uL (ref 4.0–10.5)

## 2015-04-15 LAB — BASIC METABOLIC PANEL
Anion gap: 12 (ref 5–15)
BUN: 9 mg/dL (ref 6–20)
CO2: 24 mmol/L (ref 22–32)
Calcium: 8.2 mg/dL — ABNORMAL LOW (ref 8.9–10.3)
Chloride: 100 mmol/L — ABNORMAL LOW (ref 101–111)
Creatinine, Ser: 1.12 mg/dL — ABNORMAL HIGH (ref 0.44–1.00)
GFR calc Af Amer: >60 mL/min (ref >60)
GFR calc non Af Amer: 56 mL/min — ABNORMAL LOW (ref >60)
Glucose, Bld: 184 mg/dL — ABNORMAL HIGH (ref 65–99)
Potassium: 3.9 mmol/L (ref 3.5–5.1)
Sodium: 136 mmol/L (ref 135–145)

## 2015-04-15 LAB — GLUCOSE, CAPILLARY
GLUCOSE-CAPILLARY: 154 mg/dL — AB (ref 65–99)
GLUCOSE-CAPILLARY: 169 mg/dL — AB (ref 65–99)
Glucose-Capillary: 163 mg/dL — ABNORMAL HIGH (ref 65–99)
Glucose-Capillary: 222 mg/dL — ABNORMAL HIGH (ref 65–99)
Glucose-Capillary: 216 mg/dL — ABNORMAL HIGH (ref 65–99)

## 2015-04-15 LAB — OCCULT BLOOD X 1 CARD TO LAB, STOOL: FECAL OCCULT BLD: NEGATIVE

## 2015-04-15 LAB — LACTATE DEHYDROGENASE: LDH: 157 U/L (ref 98–192)

## 2015-04-15 MED ORDER — FUROSEMIDE 40 MG PO TABS
40.0000 mg | ORAL_TABLET | Freq: Two times a day (BID) | ORAL | Status: DC
  Administered 2015-04-15 – 2015-04-17 (×4): 40 mg via ORAL
  Filled 2015-04-15 (×4): qty 1

## 2015-04-15 MED ORDER — PENICILLIN G POTASSIUM 20000000 UNITS IJ SOLR
12.0000 10*6.[IU] | Freq: Two times a day (BID) | INTRAMUSCULAR | Status: DC
  Administered 2015-04-15 – 2015-04-17 (×3): 12 10*6.[IU] via INTRAVENOUS
  Filled 2015-04-15 (×7): qty 12

## 2015-04-15 MED ORDER — GADOBENATE DIMEGLUMINE 529 MG/ML IV SOLN
15.0000 mL | Freq: Once | INTRAVENOUS | Status: AC | PRN
  Administered 2015-04-15: 15 mL via INTRAVENOUS

## 2015-04-15 MED ORDER — POTASSIUM CHLORIDE CRYS ER 20 MEQ PO TBCR
40.0000 meq | EXTENDED_RELEASE_TABLET | Freq: Once | ORAL | Status: AC
  Administered 2015-04-15: 40 meq via ORAL
  Filled 2015-04-15: qty 2

## 2015-04-15 NOTE — Progress Notes (Signed)
[facility] PROGRESS NOTE  [name] [id number] DOB: October 16, 1965 DOA: 04/12/2015 PCP: [name], MD  Assessment/Plan:  Principal Problem:   Acute diastolic congestive heart failure ([place]): echocardiogram 2 weeks ago with normal EF, grad 1 diastolic dysfunction. No previous history of heart failure. Suspect related to fluid overload from recent hospitalization for bacteremia, as well as ongoing salt load from antibiotics. No need to repeat echocardiogram. TSH normal. Weight on January 14 was about 175 pounds. At one point during previous hospitalization, was 194 pounds. Today, 175 pounds, down from 185 pounds. Change to PO lasix. Monitor daily electrolytes. May have been worsened by anemia, but patient's hemoglobin ranged 8-9 during last hospitalization, so suspect this is less of a contributing factor. Maximally concentrate all IV meds Active Problems: Acute on chronic   Symptomatic anemia: With heme positive stool on previous admission and low iron levels: hgb drifting down and right leg still tight and painful. Will check MRI to evaluate size of hematoma (as well as look for abscess, myositis). Suspect an element of chronic disease. Check haptoglobin and LDH and peripheral smear. Unless MRI explains continued downward trend of hgb, may need hematology consult. Long discussion with patient's husband last night who is a nephrologist in [place]. He advocates for transfusion to hemoglobin of 10. Patient's dyspnea is much improved. Tachycardia improved. Hold off for now but will need further transfusion if hemoglobin decreases further.   Streptococcal bacteremia: Continue penicillin   Diabetes mellitus ([place]): Diet controlled. Last hemoglobin A1c was 7.0. Patient reports, previously has been below 6.   Sinus tachycardia ([place]) improving    Essential hypertension: improving on adjusted regimen. Continue to adjust prn   SOB (shortness of breath) secondary to anemia and acute heart  failure. improving. Patient was able to ambulate with physical therapy about the unit.   right greater than leg pain and swelling: Likely related to both heart failure and Baker's cyst. DVT ruled out. see above.   HPI/Subjective: breathing almost back to baseline. No bleeding noted. Ambulated about the unit with physical therapy. Still with left calf pain and swelling, essentially unimproved from admission.   Objective: Filed Vitals:   04/15/15 0423 04/15/15 0754  BP: 154/78 169/81  Pulse: 97 102  Temp: 99.3 F (37.4 C) 99.3 F (37.4 C)  Resp: 21 19    Intake/Output Summary (Last 24 hours) at 04/15/15 1029 Last data filed at 04/15/15 0900  Gross per 24 hour  Intake    730 ml  Output    800 ml  Net    -70 ml   Filed Weights   04/13/15 0915 04/14/15 0347 04/15/15 0423  Weight: 83.28 kg (183 lb 9.6 oz) 82.101 kg (181 lb) 79.47 kg (175 lb 3.2 oz)    Exam:   General:  Alert, oriented. Breathing nonlabored.   Cardiovascular:  regular rate rhythm without murmurs gallops rubs   Respiratory:  clear to auscultation bilaterally without wheezes rhonchi or rales   Abdomen:  soft nontender nondistended   Ext:  right greater than left leg edema. Tender about the right calf. No erythema or warmth.   Basic Metabolic Panel:  Recent Labs Lab 04/08/15 2000 04/12/15 2153 04/14/15 0641 04/15/15 0450  NA 134* 137 139 136  K 4.4 3.9 3.4* 3.9  CL 100* 104 104 100*  CO2 22 22 23 24   GLUCOSE 220* 267* 159* 184*  BUN 15 11 8 9   CREATININE 1.20* 1.01* 1.06* 1.12*  CALCIUM 8.5* 8.7* 8.4* 8.2*   Liver Function  Tests:  Recent Labs Lab 04/08/15 2245 04/12/15 2153  AST 21 20  ALT 14 14  ALKPHOS 71 80  BILITOT 0.3 0.6  PROT 7.4 7.6  ALBUMIN 1.6* 1.8*   No results for input(s): LIPASE, AMYLASE in the last 168 hours. No results for input(s): AMMONIA in the last 168 hours. CBC:  Recent Labs Lab 04/08/15 2000 04/12/15 2153 04/13/15 0816 04/13/15 2021 04/15/15 0450  WBC  11.0* 9.9 7.6 9.0 8.4  NEUTROABS 9.6* 7.6  --   --   --   HGB 7.9* 7.0* 7.9* 8.5* 8.0*  HCT 23.9* 21.6* 24.3* 26.6* 24.3*  MCV 84.5 84.7 84.1 84.7 85.6  PLT 530* 474* 415* 380 297   Cardiac Enzymes: No results for input(s): CKTOTAL, CKMB, CKMBINDEX, TROPONINI in the last 168 hours. BNP (last 3 results)  Recent Labs  04/08/15 2246 04/12/15 0049  BNP 259.3* 308.7*    ProBNP (last 3 results) No results for input(s): PROBNP in the last 8760 hours.  CBG:  Recent Labs Lab 04/14/15 1649 04/14/15 1955 04/14/15 2317 04/15/15 0418 04/15/15 0741  GLUCAP 158* 193* 164* 154* 163*    Recent Results (from the past 240 hour(s))  Culture, routine-abscess     Status: None   Collection Time: 04/09/15  4:01 AM  Result Value Ref Range Status   Specimen Description ABSCESS  Final   Special Requests RIGHT CALF  Final   Gram Stain   Final    ABUNDANT WBC PRESENT,BOTH PMN AND MONONUCLEAR NO SQUAMOUS EPITHELIAL CELLS SEEN NO ORGANISMS SEEN Performed at [facility]    Culture   Final    NO GROWTH 2 DAYS Performed at [facility]    Report Status 04/11/2015 FINAL  Final  Blood culture (routine x 2)     Status: None (Preliminary result)   Collection Time: 04/12/15 10:30 PM  Result Value Ref Range Status   Specimen Description BLOOD LEFT ARM  Final   Special Requests BOTTLES DRAWN AEROBIC AND ANAEROBIC 5CC  Final   Culture NO GROWTH 2 DAYS  Final   Report Status PENDING  Incomplete  Blood culture (routine x 2)     Status: None (Preliminary result)   Collection Time: 04/12/15 10:39 PM  Result Value Ref Range Status   Specimen Description BLOOD LEFT HAND  Final   Special Requests BOTTLES DRAWN AEROBIC AND ANAEROBIC 5CC  Final   Culture NO GROWTH 2 DAYS  Final   Report Status PENDING  Incomplete  Urine culture     Status: None   Collection Time: 04/12/15 11:28 PM  Result Value Ref Range Status   Specimen Description URINE, CLEAN CATCH  Final   Special Requests NONE   Final   Culture NO GROWTH 1 DAY  Final   Report Status 04/14/2015 FINAL  Final  MRSA PCR Screening     Status: None   Collection Time: 04/13/15  2:22 AM  Result Value Ref Range Status   MRSA by PCR NEGATIVE NEGATIVE Final    Comment:        The GeneXpert MRSA Assay (FDA approved for NASAL specimens only), is one component of a comprehensive MRSA colonization surveillance program. It is not intended to diagnose MRSA infection nor to guide or monitor treatment for MRSA infections.      Studies: No results found.  Scheduled Meds: . amLODipine  10 mg Oral Daily  . furosemide  40 mg Intravenous 3 times per day  . labetalol  300 mg Oral TID  .  lisinopril  10 mg Oral Daily  . penicillin g continuous IV infusion  12 Million Units Intravenous Q12H  . sodium chloride flush  10-40 mL Intracatheter Q12H  . sodium chloride flush  3 mL Intravenous Q12H   Continuous Infusions:    Time spent: 25 minutes  [facility] [email or web address], password [facility] 04/15/2015, 10:29 AM  LOS: 2 days

## 2015-04-15 NOTE — Plan of Care (Signed)
Problem: Safety: Goal: Ability to remain free from injury will improve Outcome: Progressing Pt verbalizes understanding to call for assistance when she needs to get up to go the bathroom.

## 2015-04-15 NOTE — Evaluation (Signed)
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 Patient presenting with worsening shortness of breath and increased right calf pain and swelling.Evaluation reveals that it is a hematoma and there are no signs of clot.   Clinical Impression  Pt admitted with above diagnosis. Pt currently with functional limitations due to the deficits listed below (see PT Problem List).  Pt will benefit from skilled PT to increase their independence and safety with mobility to allow discharge to the venue listed below.  Patient able to ambulate 60 feet with min guard assist and an antalgic pattern. Patient may benefit from trial of ambulation using SPC. Anticipating D/C to home with family/friends assist.    Follow Up Recommendations Supervision for mobility/OOB;Home health PT    Equipment Recommendations  Other (comment) (To trial ambulation with cane.)    Recommendations for Other Services       Precautions / Restrictions Precautions Precautions: Other (comment) (Rt UE pain, Rt calf pain. ) Restrictions Weight Bearing Restrictions: No      Mobility  Bed Mobility Overal bed mobility: Modified Independent Bed Mobility: Supine to Sit     Supine to sit: Supervision;HOB elevated (using rail to assist)        Transfers Overall transfer level: Needs assistance Equipment used: None Transfers: Sit to/from Stand Sit to Stand: Min guard            Ambulation/Gait Ambulation/Gait assistance: Min guard Ambulation Distance (Feet): 60 Feet Assistive device: None Gait Pattern/deviations: Step-to pattern;Decreased step length - left;Decreased stance time - right;Antalgic Gait velocity:  decreased   General Gait Details: Patient with decreasing antalgic gait pattern with increasing distance. Patient reports some improvement with ambulation after serveral steps. Pattern remaining antalgic throughout but improving. Rt foot initial contact is flat.   Stairs            Wheelchair Mobility    Modified Rankin (Stroke Patients Only)       Balance Overall balance assessment: Needs assistance Sitting-balance support: No upper extremity supported Sitting balance-Leahy Scale: Good     Standing balance support: No upper extremity supported Standing balance-Leahy Scale: Fair                               Pertinent Vitals/Pain Pain Assessment: 0-10 Pain Score: 5  Pain Location: Rt leg Pain Descriptors / Indicators:  (pain) Pain Intervention(s): Limited activity within patient's tolerance;Monitored during session    [place] expects to be discharged to:: Private residence Living Arrangements: Spouse/significant other;Children Available Help at Discharge: Family;Available PRN/intermittently Type of Home: House Home Access: Stairs to enter Entrance Stairs-Rails: Left Entrance Stairs-Number of Steps: 3 Home Layout: Two level;Able to live on main level with bedroom/bathroom Home Equipment: None Additional Comments: Patient states that her husband works but her aunt will be around to provide some assistance but will not be 24 hour.    Prior Function Level of Independence: Independent               Hand Dominance        Extremity/Trunk Assessment   Upper Extremity Assessment: RUE deficits/detail RUE Deficits / Details: Limited active motion due to septic arthritis and I&D on 03/30/15.  Lower Extremity Assessment: RLE deficits/detail RLE Deficits / Details: limited by complaints of pain       Communication   Communication: No difficulties  Cognition Arousal/Alertness: Awake/alert Behavior During Therapy: WFL  for tasks assessed/performed Overall Cognitive Status: Within Functional Limits for tasks assessed                      General Comments      Exercises        Assessment/Plan    PT Assessment Patient needs continued PT services  PT Diagnosis Difficulty walking;Acute pain   PT Problem List Decreased strength;Decreased activity tolerance;Decreased mobility;Decreased knowledge of use of DME;Pain  PT Treatment Interventions DME instruction;Gait training;Stair training;Functional mobility training;Therapeutic exercise;Therapeutic activities;Patient/family education   PT Goals (Current goals can be found in the Care Plan section) Acute Rehab PT Goals Patient Stated Goal: less pain, move better and go home PT Goal Formulation: With patient Time For Goal Achievement: 04/29/15 Potential to Achieve Goals: Good    Frequency Min 3X/week   Barriers to discharge        Co-evaluation               End of Session Equipment Utilized During Treatment: Gait belt Activity Tolerance: Patient limited by pain Patient left: in chair;with call bell/phone within reach Nurse Communication: Mobility status         Time: [id number] PT Time Calculation (min) (ACUTE ONLY): 26 min   Charges:   PT Evaluation $PT Eval Moderate Complexity: 1 Procedure PT Treatments $Gait Training: 8-22 mins   PT G Codes:        Cassell Clement, PT, CSCS Pager [phone] Office [phone]  04/15/2015, 10:12 AM

## 2015-04-15 NOTE — Research (Signed)
[email or web address]  Informed Consent   Subject Name: [name]  Subject met inclusion and exclusion criteria.  The informed consent form, study requirements and expectations were reviewed with the subject and questions and concerns were addressed prior to the signing of the consent form.  The subject verbalized understanding of the trail requirements.  The subject agreed to participate in the [email or web address]  trial and signed the informed consent.  The informed consent was obtained prior to performance of any protocol-specific procedures for the subject.  A copy of the signed informed consent was given to the subject and a copy was placed in the subject's medical record.  [name] 04/15/2015, 13:56

## 2015-04-15 NOTE — Progress Notes (Signed)
[name] 10:02 AM  Subjective: Patient without any post colonoscopy problems and no signs of bleeding and walking in the halls with physical therapy  Objective: Vital signs stable afebrile no acute distress hemoglobin slight decrease not examined looks well  Assessment: Probably anemia of chronic disease guaiac positivity questionable secondary to hemorrhoids  Plan: Happy see back when necessary please call if any question or GI problem and will follow up biopsy report  [facility]  Pager [phone] After 5PM or if no answer call [phone]

## 2015-04-16 ENCOUNTER — Encounter (HOSPITAL_COMMUNITY)
Admission: EM | Disposition: A | Discharge status: Home Health | Payer: Self-pay | Source: Home / Self Care | Attending: Internal Medicine

## 2015-04-16 ENCOUNTER — Encounter (HOSPITAL_COMMUNITY): Payer: Self-pay

## 2015-04-16 HISTORY — PX: ESOPHAGOGASTRODUODENOSCOPY: SHX5428

## 2015-04-16 LAB — CBC
HCT: 25 % — ABNORMAL LOW (ref 36.0–46.0)
Hemoglobin: 8.3 g/dL — ABNORMAL LOW (ref 12.0–15.0)
MCH: 28.2 pg (ref 26.0–34.0)
MCHC: 33.2 g/dL (ref 30.0–36.0)
MCV: 85 fL (ref 78.0–100.0)
PLATELETS: 325 10*3/uL (ref 150–400)
RBC: 2.94 MIL/uL — ABNORMAL LOW (ref 3.87–5.11)
RDW: 15.2 % (ref 11.5–15.5)
WBC: 8.1 10*3/uL (ref 4.0–10.5)

## 2015-04-16 LAB — BASIC METABOLIC PANEL
ANION GAP: 11 (ref 5–15)
BUN: 8 mg/dL (ref 6–20)
CALCIUM: 8.7 mg/dL — AB (ref 8.9–10.3)
CO2: 24 mmol/L (ref 22–32)
Chloride: 101 mmol/L (ref 101–111)
Creatinine, Ser: 0.96 mg/dL (ref 0.44–1.00)
GLUCOSE: 179 mg/dL — AB (ref 65–99)
POTASSIUM: 4.4 mmol/L (ref 3.5–5.1)
SODIUM: 136 mmol/L (ref 135–145)

## 2015-04-16 LAB — GLUCOSE, CAPILLARY
GLUCOSE-CAPILLARY: 164 mg/dL — AB (ref 65–99)
GLUCOSE-CAPILLARY: 155 mg/dL — AB (ref 65–99)
GLUCOSE-CAPILLARY: 185 mg/dL — AB (ref 65–99)
Glucose-Capillary: 190 mg/dL — ABNORMAL HIGH (ref 65–99)

## 2015-04-16 LAB — PREPARE RBC (CROSSMATCH)

## 2015-04-16 SURGERY — EGD (ESOPHAGOGASTRODUODENOSCOPY)
Anesthesia: Moderate Sedation

## 2015-04-16 MED ORDER — INSULIN ASPART 100 UNIT/ML ~~LOC~~ SOLN
0.0000 [IU] | Freq: Three times a day (TID) | SUBCUTANEOUS | Status: DC
  Administered 2015-04-17: 1 [IU] via SUBCUTANEOUS
  Administered 2015-04-17: 2 [IU] via SUBCUTANEOUS

## 2015-04-16 MED ORDER — BUTAMBEN-TETRACAINE-BENZOCAINE 2-2-14 % EX AERO
INHALATION_SPRAY | CUTANEOUS | Status: DC | PRN
  Administered 2015-04-16: 2 via TOPICAL

## 2015-04-16 MED ORDER — FENTANYL CITRATE (PF) 100 MCG/2ML IJ SOLN
INTRAMUSCULAR | Status: DC | PRN
  Administered 2015-04-16 (×2): 25 ug via INTRAVENOUS

## 2015-04-16 MED ORDER — ACETAMINOPHEN 325 MG PO TABS
650.0000 mg | ORAL_TABLET | ORAL | Status: DC | PRN
  Administered 2015-04-16: 650 mg via ORAL

## 2015-04-16 MED ORDER — METFORMIN HCL 500 MG PO TABS: 500.0000 mg | ORAL_TABLET | Freq: Two times a day (BID) | ORAL | Status: DC

## 2015-04-16 MED ORDER — FENTANYL CITRATE (PF) 100 MCG/2ML IJ SOLN
INTRAMUSCULAR | Status: AC
  Filled 2015-04-16: qty 2

## 2015-04-16 MED ORDER — MIDAZOLAM HCL 5 MG/ML IJ SOLN
INTRAMUSCULAR | Status: AC
  Filled 2015-04-16: qty 2

## 2015-04-16 MED ORDER — SODIUM CHLORIDE 0.9 % IV SOLN
INTRAVENOUS | Status: DC
  Administered 2015-04-16: 500 mL via INTRAVENOUS

## 2015-04-16 MED ORDER — FUROSEMIDE 10 MG/ML IJ SOLN
40.0000 mg | Freq: Once | INTRAMUSCULAR | Status: AC
  Administered 2015-04-17: 40 mg via INTRAVENOUS
  Filled 2015-04-16: qty 4

## 2015-04-16 MED ORDER — FERROUS SULFATE 325 (65 FE) MG PO TABS
325.0000 mg | ORAL_TABLET | Freq: Two times a day (BID) | ORAL | Status: DC
  Administered 2015-04-16 – 2015-04-17 (×2): 325 mg via ORAL
  Filled 2015-04-16 (×3): qty 1

## 2015-04-16 MED ORDER — SODIUM CHLORIDE 0.9 % IV SOLN: Freq: Once | INTRAVENOUS | Status: DC

## 2015-04-16 MED ORDER — LISINOPRIL 20 MG PO TABS
20.0000 mg | ORAL_TABLET | Freq: Every day | ORAL | Status: DC
  Administered 2015-04-16 – 2015-04-17 (×2): 20 mg via ORAL
  Filled 2015-04-16 (×2): qty 1

## 2015-04-16 MED ORDER — DIPHENHYDRAMINE HCL 50 MG/ML IJ SOLN
INTRAMUSCULAR | Status: AC
  Filled 2015-04-16: qty 1

## 2015-04-16 MED ORDER — ACETAMINOPHEN 325 MG PO TABS
325.0000 mg | ORAL_TABLET | Freq: Once | ORAL | Status: AC
  Administered 2015-04-16: 325 mg via ORAL
  Filled 2015-04-16: qty 1

## 2015-04-16 MED ORDER — METFORMIN HCL 500 MG PO TABS
500.0000 mg | ORAL_TABLET | Freq: Two times a day (BID) | ORAL | Status: DC
  Filled 2015-04-16: qty 1

## 2015-04-16 MED ORDER — MIDAZOLAM HCL 10 MG/2ML IJ SOLN
INTRAMUSCULAR | Status: DC | PRN
  Administered 2015-04-16: 1 mg via INTRAVENOUS
  Administered 2015-04-16 (×2): 2 mg via INTRAVENOUS

## 2015-04-16 NOTE — Progress Notes (Addendum)
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 Patient presenting with worsening shortness of breath and increased right calf pain and swelling.Evaluation reveals that it is a hematoma and there are no signs of clot.     PT Comments    Patient increasing her ambulation during PT session to 250 feet with min guard assistance and no assistive device. Gait pattern remains antalgic but improved compared to previous session. At this time the patient has declined trial of an assistive device [facility]). Will continue to follow with anticipation of D/C to home with family/friends assistance.   Follow Up Recommendations  Supervision for mobility/OOB;Home health PT     Equipment Recommendations  None recommended by PT;Other (comment) (patient declined trial of [facility])    Recommendations for Other Services       Precautions / Restrictions Precautions Precautions: Other (comment) (Rt calf and Rt shoulder pain) Restrictions Weight Bearing Restrictions: No    Mobility  Bed Mobility Overal bed mobility: Modified Independent Bed Mobility: Supine to Sit     Supine to sit: Modified independent (Device/Increase time) (with rail)        Transfers Overall transfer level: Needs assistance Equipment used: None Transfers: Sit to/from Stand Sit to Stand: Min guard         General transfer comment: min guard for safety  Ambulation/Gait Ambulation/Gait assistance: Min guard Ambulation Distance (Feet): 250 Feet Assistive device: None Gait Pattern/deviations: Step-through pattern;Decreased dorsiflexion - right;Antalgic Gait velocity: decreased   General Gait Details: patient unable to dorsiflex  on rt to allow for heel strike pattern. Patient declined trial of SPC with ambulation.    Stairs Stairs:  (refused attempt, verbally reviewed technique.)          Wheelchair Mobility    Modified Rankin (Stroke Patients Only)       Balance Overall balance assessment: Needs assistance Sitting-balance support: No upper extremity supported Sitting balance-Leahy Scale: Good     Standing balance support: No upper extremity supported Standing balance-Leahy Scale: Good                      Cognition Arousal/Alertness: Awake/alert Behavior During Therapy: WFL for tasks assessed/performed Overall Cognitive Status: Within Functional Limits for tasks assessed                      Exercises      General Comments        Pertinent Vitals/Pain Pain Assessment: Faces Faces Pain Scale: Hurts little more Pain Location: Rt calf Pain Descriptors / Indicators: Guarding;Grimacing Pain Intervention(s): Limited activity within patient's tolerance;Monitored during session    Home Living                      Prior Function            PT Goals (current goals can now be found in the care plan section) Acute Rehab PT Goals Patient Stated Goal: go home  PT Goal Formulation: With patient Time For Goal Achievement: 04/29/15 Potential to Achieve Goals: Good Progress towards PT goals: Progressing toward goals    Frequency  Min 3X/week    PT Plan Current plan remains appropriate    Co-evaluation  End of Session Equipment Utilized During Treatment: Gait belt Activity Tolerance: Patient limited by fatigue;Patient limited by pain Patient left: in chair;with call bell/phone within reach     Time: 1146-1205 PT Time Calculation (min) (ACUTE ONLY): 19 min  Charges:  $Gait Training: 8-22 mins                    G Codes:      Cassell Clement, PT, CSCS Pager [phone] Office [phone]  04/16/2015, 3:42 PM

## 2015-04-16 NOTE — Progress Notes (Signed)
[name] 2:50 PM  Subjective: Patient without any GI complaints and no further signs of bleeding and case discussed with the hospital team as well as her husband and he requests a inpatient endoscopy  Objective: Vital signs stable afebrile no acute distress exam please see preassessment evaluation labs stable  Assessment: Anemia of chronic disease  Plan: I discussed endoscopy with the patient and her husband and will proceed this afternoon with further workup and plans pending those findings  [facility]  Pager [phone] After 5PM or if no answer call [phone]

## 2015-04-16 NOTE — Progress Notes (Signed)
Discussed with pts husband by phone. He remains fixated on hemoglobin and demands transfusing to hgb of 10.  He is concerned her hemoglobin will drop after discharge, lead to recurrent CHF. Became very beligerent and rude. "She almost died.  I'm not going to let you kill her. I'm calling the CEO!" I asked him to not speak to me  In that fashion and excused myself from the conversation. As hematoma is slightly larger, and does have CHF. Will transfuse 1 unit, followed by lasix. Discharge tomorrow if stable.    Doree Barthel, MD

## 2015-04-16 NOTE — Progress Notes (Addendum)
Pt due to receive blood. While doing pre-transfusion vitals pt was noted to have a temp of 100.9. Paged NP Rogue Bussing to see if it was ok to transfuse blood. Received instructions to give a dose of Tylenol and recheck temperature in 30 mins.  Rechecked temp it was 101.3. Re-paged NP Rogue Bussing and received orders for Tylenol and to let him know when her temp goes down. Will continue to monitor pt.   [name]

## 2015-04-16 NOTE — Op Note (Signed)
[facility] [place], [address]   ENDOSCOPY PROCEDURE REPORT  PATIENT: [name], [name]  MR#: [id number] BIRTHDATE: 1965-04-14 , 49  yrs. old GENDER: female ENDOSCOPIST: Clarene Essex, MD REFERRED BY:  Seward Carol, M.D. PROCEDURE DATE:  04/16/2015 PROCEDURE:  EGD, diagnostic ASA CLASS:     Class II INDICATIONS:  anemia. MEDICATIONS: Fentanyl 50 mcg IV and Versed 5 mg IV TOPICAL ANESTHETIC: Cetacaine Spray  DESCRIPTION OF PROCEDURE: After the risks benefits and alternatives of the procedure were thoroughly explained, informed consent was obtained.  The Pentax Gastroscope [id number] endoscope was introduced through the mouth and advanced to the third portion of the duodenum , Without limitations.  The instrument was slowly withdrawn as the mucosa was fully examined. Estimated blood loss is zero unless otherwise noted in this procedure report.    The findings are recorded below       Retroflexed views revealed no abnormalities.     The scope was then withdrawn from the patient and the procedure completed.  COMPLICATIONS: There were no immediate complications.  ENDOSCOPIC IMPRESSION: 1. Small hiatal hernia 2. Some residual food along proximal greater curve that impaired visualization  #3. Otherwise within normal limits to the third portion of the duodenum without any bleeding being seen  RECOMMENDATIONS: no further GI workup happy to see back when necessary H2 blockers or pump inhibitors when necessary  but currently she is asymptomatic so okay to hold off  REPEAT EXAM: as needed  eSigned:  Clarene Essex, MD 04/16/2015 3:12 PM    [id number], Jori Moll MD  CPT CODES: ICD CODES:  The ICD and CPT codes recommended by this software are interpretations from the data that the clinical staff has captured with the software.  The verification of the translation of this report to the ICD and CPT codes and modifiers is the sole responsibility  of the health care institution and practicing physician where this report was generated.  [employer] will not be held responsible for the validity of the ICD and CPT codes included on this report.  AMA assumes no liability for data contained or not contained herein. CPT is a Designer, television/film set of the [employer].  PATIENT NAME:  Azailya, Mcgannon MR#: [id number]

## 2015-04-16 NOTE — Progress Notes (Addendum)
Inpatient Diabetes Program Recommendations  AACE/ADA: New Consensus Statement on Inpatient Glycemic Control (2015)  Target Ranges:  Prepandial:   less than 140 mg/dL      Peak postprandial:   less than 180 mg/dL (1-2 hours)      Critically ill patients:  140 - 180 mg/dL    Results for LINDI, TRIAS (MRN [id number]) as of 04/16/2015 11:58  Ref. Range 04/15/2015 07:41 04/15/2015 11:57 04/15/2015 16:59 04/15/2015 19:41  Glucose-Capillary Latest Ref Range: 65-99 mg/dL 163 (H) 222 (H) 169 (H) 216 (H)    Home DM Meds: Metformin 500 mg bid  Current Insulin Orders: None     MD- Note Novolog Sensitive SSI (0-9 units) discontinued on 01/31.  Patient having some elevated CBGs.  Please consider adding back Novolog Sensitive SSI (0-9 units) TID AC while home Metformin on hold     --Will follow patient during hospitalization--  Wyn Quaker RN, MSN, CDE Diabetes Coordinator Inpatient Glycemic Control Team Team Pager: [phone] (8a-5p)

## 2015-04-16 NOTE — Progress Notes (Addendum)
[facility] PROGRESS NOTE  [name] [id number] DOB: 1966-01-14 DOA: 04/12/2015 PCP: [name], MD  Assessment/Plan:  Principal Problem:   Acute diastolic congestive heart failure ([place]): echocardiogram 2 weeks ago with normal EF, grad 1 diastolic dysfunction. No previous history of heart failure. Suspect related to fluid overload from recent hospitalization for bacteremia, as well as ongoing salt/fluid load from antibiotics. No need to repeat echocardiogram. TSH normal. Weight on January 14 was about 175 pounds. At one point during previous hospitalization, was 194 pounds. Today, 175 pounds, down from 185 pounds. Changed to PO lasix. Monitor daily electrolytes. May have been worsened by anemia, but patient's hemoglobin ranged 8-9 during last hospitalization, so suspect this is less of a contributing factor. Maximally concentrate all IV meds Active Problems: Acute on chronic   Symptomatic anemia: component of anemia of CD. Confirmed by outpatient labs at [facility], per Dr. Watt Climes. Stool is heme negative this admission. Doubt ongoing GIB. Husband advocating for EGD. Would be low yield, and nonemergent. D/w Dr. Watt Climes. Likely unable to get anesthesia for procedure today. Can do as outpatient. Resume iron. Hematoma right leg slightly bigger on MRI and contributing. hgb 8.3 today. No dyspnea, tachycardia. No indication for transfusion at this time, despite husband's request.   Streptococcal bacteremia: Continue penicillin. Had low grade fever last night. Will monitor overnight   Diabetes mellitus ([place]): Diet controlled. Last hemoglobin A1c was 7.0. Patient reports, previously has been below 6.   Sinus tachycardia ([place]) resolved. D/c tele   Essential hypertension: higher than goal. Will adjust meds further   SOB (shortness of breath) secondary to anemia and acute heart failure. Resolved. Back to baseline  right greater leg pain and swelling: MRI without abscess. Due to bakers  cyst/hematoma. Discussed with radiology   HPI/Subjective: breathing back to baseline. Leg pain remains, but slightly improved. No bleeding  Objective: Filed Vitals:   04/16/15 0500 04/16/15 0811  BP:  164/88  Pulse:  98  Temp: 99 F (37.2 C) 98.5 F (36.9 C)  Resp:  20    Intake/Output Summary (Last 24 hours) at 04/16/15 0844 Last data filed at 04/16/15 0339  Gross per 24 hour  Intake    725 ml  Output   2550 ml  Net  -1825 ml   Filed Weights   04/14/15 0347 04/15/15 0423 04/16/15 0330  Weight: 82.101 kg (181 lb) 79.47 kg (175 lb 3.2 oz) 79.289 kg (174 lb 12.8 oz)    Exam:   General:  Alert, oriented. Breathing nonlabored.   Cardiovascular:  regular rate rhythm without murmurs gallops rubs   Respiratory:  clear to auscultation bilaterally without wheezes rhonchi or rales   Abdomen:  soft nontender nondistended   Ext:  right greater than left leg edema. Tender about the right calf. No erythema or warmth.   Basic Metabolic Panel:  Recent Labs Lab 04/12/15 2153 04/14/15 0641 04/15/15 0450 04/16/15 0428  NA 137 139 136 136  K 3.9 3.4* 3.9 4.4  CL 104 104 100* 101  CO2 22 23 24 24   GLUCOSE 267* 159* 184* 179*  BUN 11 8 9 8   CREATININE 1.01* 1.06* 1.12* 0.96  CALCIUM 8.7* 8.4* 8.2* 8.7*   Liver Function Tests:  Recent Labs Lab 04/12/15 2153  AST 20  ALT 14  ALKPHOS 80  BILITOT 0.6  PROT 7.6  ALBUMIN 1.8*   No results for input(s): LIPASE, AMYLASE in the last 168 hours. No results for input(s): AMMONIA in the last 168 hours. CBC:  Recent Labs Lab 04/12/15 2153 04/13/15 0816 04/13/15 2021 04/15/15 0450 04/16/15 0428  WBC 9.9 7.6 9.0 8.4 8.1  NEUTROABS 7.6  --   --   --   --   HGB 7.0* 7.9* 8.5* 8.0* 8.3*  HCT 21.6* 24.3* 26.6* 24.3* 25.0*  MCV 84.7 84.1 84.7 85.6 85.0  PLT 474* 415* 380 297 325   Cardiac Enzymes: No results for input(s): CKTOTAL, CKMB, CKMBINDEX, TROPONINI in the last 168 hours. BNP (last 3 results)  Recent Labs   04/08/15 2246 04/12/15 0049  BNP 259.3* 308.7*    ProBNP (last 3 results) No results for input(s): PROBNP in the last 8760 hours.  CBG:  Recent Labs Lab 04/15/15 0741 04/15/15 1157 04/15/15 1659 04/15/15 1941 04/16/15 0727  GLUCAP 163* 222* 169* 216* 164*    Recent Results (from the past 240 hour(s))  Culture, routine-abscess     Status: None   Collection Time: 04/09/15  4:01 AM  Result Value Ref Range Status   Specimen Description ABSCESS  Final   Special Requests RIGHT CALF  Final   Gram Stain   Final    ABUNDANT WBC PRESENT,BOTH PMN AND MONONUCLEAR NO SQUAMOUS EPITHELIAL CELLS SEEN NO ORGANISMS SEEN Performed at [facility]    Culture   Final    NO GROWTH 2 DAYS Performed at [facility]    Report Status 04/11/2015 FINAL  Final  Blood culture (routine x 2)     Status: None (Preliminary result)   Collection Time: 04/12/15 10:30 PM  Result Value Ref Range Status   Specimen Description BLOOD LEFT ARM  Final   Special Requests BOTTLES DRAWN AEROBIC AND ANAEROBIC 5CC  Final   Culture NO GROWTH 3 DAYS  Final   Report Status PENDING  Incomplete  Blood culture (routine x 2)     Status: None (Preliminary result)   Collection Time: 04/12/15 10:39 PM  Result Value Ref Range Status   Specimen Description BLOOD LEFT HAND  Final   Special Requests BOTTLES DRAWN AEROBIC AND ANAEROBIC 5CC  Final   Culture NO GROWTH 3 DAYS  Final   Report Status PENDING  Incomplete  Urine culture     Status: None   Collection Time: 04/12/15 11:28 PM  Result Value Ref Range Status   Specimen Description URINE, CLEAN CATCH  Final   Special Requests NONE  Final   Culture NO GROWTH 1 DAY  Final   Report Status 04/14/2015 FINAL  Final  MRSA PCR Screening     Status: None   Collection Time: 04/13/15  2:22 AM  Result Value Ref Range Status   MRSA by PCR NEGATIVE NEGATIVE Final    Comment:        The GeneXpert MRSA Assay (FDA approved for NASAL specimens only), is one  component of a comprehensive MRSA colonization surveillance program. It is not intended to diagnose MRSA infection nor to guide or monitor treatment for MRSA infections.      Studies: Mr Tibia Fibula Right W Wo Contrast  04/16/2015  CLINICAL DATA:  Increased swelling with pain and erythema in the right lower leg. Bacteremia. EXAM: MRI OF LOWER RIGHT EXTREMITY WITHOUT AND WITH CONTRAST TECHNIQUE: Multiplanar, multisequence MR imaging of the right lower leg was performed both before and after administration of intravenous contrast. We centered high enough to include the knees and distal femurs but exclude the ankles in order to fully encompass the region of concern. Please note that although this exam includes the knees,  this is not an orthopedic knee assessment. CONTRAST:  45mL MULTIHANCE GADOBENATE DIMEGLUMINE 529 MG/ML IV SOLN COMPARISON:  None. FINDINGS: Moderate right knee effusion with mild synovitis. Complex Baker's cyst tracking caudad superficial to the medial head gastrocnemius on the left, size 15.6 by 6.7 by 3.0 cm, heterogeneous internal T2 signal with faintly heterogeneous T1 signal, likely representing blood products or less likely synovitis within this lesion. There is asymmetric edema in the medial head gastrocnemius and to a lesser extent the lateral head gastrocnemius on the right compared to the left. Diffuse subcutaneous edema around the knee and upper calf bilaterally but right greater than left. Edema tracks along the superficial fascia bilaterally and also to a lesser extent within the left anterior compartment. Degenerative osteochondral lesion along the lateral patellar facet on the right, along with spurring of the patella and mild marginal spurring of the medial compartment of the right knee. IMPRESSION: 1. Large left Baker's cyst extending caudad along the superficial fascia margin of the medial head gastrocnemius, with nonenhancing complex internal material favoring mixed  hematoma. Abscess is considered significantly less likely given the lack of a thick enhancing rind. 2. There is some abnormal edema in the gastrocnemius musculature on the right, such that myositis is not excluded. Given that the hematoma tracks along the superficial fascia margin, the possibility of a shearing injury in this vicinity is raised, correlate with recent trauma. 3. Asymmetric moderate-sized right knee joint effusion, very faint synovitis. 4. Subcutaneous edema around the knees and upper calves, right greater than left. 5. Low-level edema and enhancement in the anterior compartment of the left lower leg, most notably involving the extensor digitorum longus muscle. This is likely indicative of inflammation but is otherwise nonspecific. Overall no effacement of anterior compartmental adipose tissue to suggest compartment syndrome on the left. 6. Degenerative findings in the right knee most notably in the patellofemoral joint. Electronically Signed   By: Van Clines M.D.   On: 04/16/2015 06:45    Scheduled Meds: . amLODipine  10 mg Oral Daily  . furosemide  40 mg Oral BID  . labetalol  300 mg Oral TID  . lisinopril  10 mg Oral Daily  . penicillin g continuous IV infusion  12 Million Units Intravenous Q12H  . sodium chloride flush  10-40 mL Intracatheter Q12H  . sodium chloride flush  3 mL Intravenous Q12H   Continuous Infusions:    Time spent: 35 minutes  [facility] [email or web address], password [facility] 04/16/2015, 8:44 AM  LOS: 3 days

## 2015-04-17 LAB — BASIC METABOLIC PANEL
Anion gap: 14 (ref 5–15)
BUN: 6 mg/dL (ref 6–20)
CO2: 24 mmol/L (ref 22–32)
Calcium: 9.1 mg/dL (ref 8.9–10.3)
Chloride: 99 mmol/L — ABNORMAL LOW (ref 101–111)
Creatinine, Ser: 0.86 mg/dL (ref 0.44–1.00)
GFR calc Af Amer: >60 mL/min (ref >60)
GLUCOSE: 201 mg/dL — AB (ref 65–99)
POTASSIUM: 3.8 mmol/L (ref 3.5–5.1)
Sodium: 137 mmol/L (ref 135–145)

## 2015-04-17 LAB — CBC WITH DIFFERENTIAL/PLATELET
Basophils Absolute: 0.1 10*3/uL (ref 0.0–0.1)
Basophils Relative: 1 %
EOS PCT: 3 %
Eosinophils Absolute: 0.2 10*3/uL (ref 0.0–0.7)
HCT: 28.9 % — ABNORMAL LOW (ref 36.0–46.0)
Hemoglobin: 9.5 g/dL — ABNORMAL LOW (ref 12.0–15.0)
LYMPHS PCT: 18 %
Lymphs Abs: 1.2 10*3/uL (ref 0.7–4.0)
MCH: 27.9 pg (ref 26.0–34.0)
MCHC: 32.9 g/dL (ref 30.0–36.0)
MCV: 85 fL (ref 78.0–100.0)
Monocytes Absolute: 0.5 10*3/uL (ref 0.1–1.0)
Monocytes Relative: 8 %
NEUTROS PCT: 70 %
Neutro Abs: 4.8 10*3/uL (ref 1.7–7.7)
PLATELETS: 299 10*3/uL (ref 150–400)
RBC: 3.4 MIL/uL — AB (ref 3.87–5.11)
RDW: 15.1 % (ref 11.5–15.5)
WBC: 6.8 10*3/uL (ref 4.0–10.5)

## 2015-04-17 LAB — CULTURE, BLOOD (ROUTINE X 2)
CULTURE: NO GROWTH
Culture: NO GROWTH

## 2015-04-17 LAB — GLUCOSE, CAPILLARY
Glucose-Capillary: 173 mg/dL — ABNORMAL HIGH (ref 65–99)
Glucose-Capillary: 150 mg/dL — ABNORMAL HIGH (ref 65–99)

## 2015-04-17 LAB — HAPTOGLOBIN: Haptoglobin: 495 mg/dL — ABNORMAL HIGH (ref 34–200)

## 2015-04-17 MED ORDER — POTASSIUM CHLORIDE ER 10 MEQ PO TBCR
10.0000 meq | EXTENDED_RELEASE_TABLET | Freq: Every day | ORAL | Status: DC
Start: 2015-04-17 — End: 2015-05-05

## 2015-04-17 MED ORDER — PENICILLIN G POTASSIUM 20000000 UNITS IJ SOLR: INTRAVENOUS | Status: DC

## 2015-04-17 MED ORDER — LISINOPRIL 20 MG PO TABS: 20.0000 mg | ORAL_TABLET | Freq: Every day | ORAL | Status: AC

## 2015-04-17 MED ORDER — HEPARIN SOD (PORK) LOCK FLUSH 100 UNIT/ML IV SOLN
250.0000 [IU] | INTRAVENOUS | Status: AC | PRN
Start: 2015-04-17 — End: 2015-04-17
  Administered 2015-04-17: 500 [IU]

## 2015-04-17 MED ORDER — FUROSEMIDE 40 MG PO TABS: 40.0000 mg | ORAL_TABLET | Freq: Two times a day (BID) | ORAL | Status: DC

## 2015-04-17 NOTE — Progress Notes (Signed)
Patient being discharged home per MD order. All discharge instructions given to patient and her husband, verbalized understanding. Paperwork given to patient. IV Team heparinized patients right chest [place] line.

## 2015-04-17 NOTE — Discharge Summary (Signed)
Physician Discharge Summary  [name] VN:[id number] DOB: [birth year] DOA: 04/12/2015  PCP: [name], MD  Admit date: 04/12/2015 Discharge date: 04/17/2015  Time spent: greater than 30 minutes  Recommendations for Outpatient Follow-up:  1. Monitor h/h 2. Monitor weights 3. Monitor BMET 4. Home pt, rn arranged   Discharge Diagnoses:  Principal Problem:   Acute diastolic congestive heart failure (HCC) Active Problems:   Streptococcal bacteremia, present prior to admission   Diabetes mellitus ([place])   Essential hypertension, uncontrolled   Acute pulmonary edema (HCC)   SOB (shortness of breath)   Symptomatic anemia Hematoma, right leg Bilateral bakers cysts  Discharge Condition: stable  Diet recommendation: heart healthy  Filed Weights   04/16/15 0330 04/16/15 1424 04/17/15 0430  Weight: 79.289 kg (174 lb 12.8 oz) 78.926 kg (174 lb) 77.701 kg (171 lb 4.8 oz)    History of present illness:  50 year old female with past medical history significant for HTN, diabetes mellitus type 2, recent streptococcal bacteremia with septic arthritis of the right shoulder joint status post evacuation with placement of a right subclavian catheter to receive 4 weeks(thur 2/13 ) of penicillin G hospitalized at [facility] from 1/14 until 1/25; who presents with worsening shortness of breath. Since being discharged patient noted that she had right calf pain and swelling for which she came back to the emergency department on the 26th and was evaluated and told that it was a hematoma and there are no signs of clot. She reports continued intermittent low-grade fevers up to 80F. Husband notes that since even before she left the hospital she's been tachycardic and that her hemoglobins have been significantly low. He states that her anemia is the cause for her congestive heart failure and he wants her transfused blood until her hemoglobin is 10. In reviewing patient's discharge summary it appears  she had received 2 units of blood during her previous admission and because of the guaiac positive stools she was referred to GI as outpatient. Patient was not able to follow-up with GI prior to return to the hospital today. Husband requested that GI be consulted inpatient. Patient complains of swelling of her lower extremities bilaterally. She reports having a improved appetite since being discharged.  Upon admission to day patient was found to be significantly tachycardic and tachypneic. Initial BMP was elevated at 308.7. She is given a dose of Lasix: while in the ED. For the patient's anemia as her hemoglobin was noted to be 7 she was typed and screened and ordered to be transfused 2 units.  Hospital Course:  Acute diastolic congestive heart failure [facility]): echocardiogram 2 weeks ago with normal EF, grad 1 diastolic dysfunction. No previous history of heart failure. Suspect related to fluid overload from recent hospitalization for bacteremia, as well as ongoing salt/fluid load from antibiotics. No need to repeat echocardiogram. TSH normal. Responded well to IV lasix.  Weight on January 14 was about 175 pounds. At one point during previous hospitalization, was 194 pounds. at discharge, 175 pounds, down from 185 pounds.  May have been worsened by anemia, but patient's hemoglobin ranged 8-9 during last hospitalization, so suspect this is less of a contributing factor. Will increase home lasix. Will need close outpatient follow up of BMET, weight  Acute on chronic Symptomatic anemia: component of anemia of chronic disease. Confirmed by outpatient labs at [facility], per Dr. Watt Climes. Stool is heme negative this admission, but positive last admission. Had EGD and colonoscopy which showed no ongoing bleed. Did have MRI leg shows  large hematoma which likely contributing, as well as anemia of acute illness, multiple phlebotomies. Status post 3 units of packed red blood cells. At discharge, hemoglobin 9.5.    Streptococcal bacteremia, present prior to admission: Continue penicillin as previously planned. Repeat blood cultures on admission negative. MRI of right leg shows no evidence of abscess.   Diabetes mellitus ([place]): creatinine normal. May resume metformin.    Essential hypertension: uncontrolled and likely contributing to acute diastolic heart failure. Medications adjusted. Will need outpatient follow-up.   right greater leg pain and swelling: MRI without abscess. Due to bakers cyst/hematoma. Doppler without DVT.    Procedures:  EGD: small hiatal hernia  Colonoscopy: Small internal and a sternal hemorrhoids, tiny transverse polyp cold biopsy, tubular adenoma   Consultations:  GI  Orthopedics   Discharge Exam: Filed Vitals:   04/17/15 0430 04/17/15 0751  BP: 161/82   Pulse: 102   Temp: 99.1 F (37.3 C) 99 F (37.2 C)  Resp: 16     General: nontoxic. Comfortable. Alert and oriented. Cardiovascular: Regular rate rhythm without murmurs gallops rubs  Respiratory: clear to auscultation bilaterally without wheezes rhonchi or rales  Extremities: No pitting edema. Right leg more swollen than left.  Discharge Instructions   Discharge Instructions    Diet - low sodium heart healthy    Complete by:  As directed      Increase activity slowly    Complete by:  As directed           Current Discharge Medication List    START taking these medications   Details  lisinopril (PRINIVIL,ZESTRIL) 20 MG tablet Take 1 tablet (20 mg total) by mouth daily. Qty: 30 tablet, Refills: 0    potassium chloride (K-DUR) 10 MEQ tablet Take 1 tablet (10 mEq total) by mouth daily. Qty: 30 tablet, Refills: 0      CONTINUE these medications which have CHANGED   Details  furosemide (LASIX) 40 MG tablet Take 1 tablet (40 mg total) by mouth 2 (two) times daily. Qty: 60 tablet, Refills: 0      CONTINUE these medications which have NOT CHANGED   Details  acetaminophen (TYLENOL) 325 MG tablet  Take 325 mg by mouth every 6 (six) hours as needed for mild pain or moderate pain.    amLODipine (NORVASC) 10 MG tablet Take 1 tablet (10 mg total) by mouth daily. Qty: 30 tablet, Refills: 0    feeding supplement, GLUCERNA SHAKE, (GLUCERNA SHAKE) LIQD Take 237 mLs by mouth 3 (three) times daily between meals. Qty: 90 Can, Refills: 0    ferrous sulfate 325 (65 FE) MG tablet Take 1 tablet (325 mg total) by mouth 2 (two) times daily with a meal. Qty: 60 tablet, Refills: 0    labetalol (NORMODYNE) 200 MG tablet Take 1 tablet (200 mg total) by mouth 3 (three) times daily. Qty: 90 tablet, Refills: 0    metFORMIN (GLUCOPHAGE) 500 MG tablet Take 500 mg by mouth 2 (two) times daily. Refills: 1    oxyCODONE-acetaminophen (PERCOCET) 5-325 MG tablet Take 0.5-1 tablets by mouth every 4 (four) hours as needed. Qty: 15 tablet, Refills: 0    penicillin G potassium in dextrose 5 % 500 mL Please infuse 24 million units over 24 hours continuously through 04/26/2015. Qty: 40 Dose, Refills: 0    polyethylene glycol (MIRALAX / GLYCOLAX) packet Take 17 g by mouth daily. Qty: 14 each, Refills: 0    saccharomyces boulardii (FLORASTOR) 250 MG capsule Take 1 capsule (250 mg total)  by mouth 2 (two) times daily. Qty: 60 capsule, Refills: 3    senna-docusate (SENOKOT-S) 8.6-50 MG tablet Take 2 tablets by mouth at bedtime as needed for mild constipation.       No Known Allergies Follow-up Information    Follow up with POLITE,RONALD D, MD In 1 week.   Specialty:  Internal Medicine   Why:  to check swelling, basic metabolic panel, hemoglobin   Contact information:   [address] [place] [address] [phone]       Follow up with MURPHY, Ernesta Amble, MD In 1 week.   Specialty:  Orthopedic Surgery   Why:  to check leg and shoulder   Contact information:   [address] [place] [address] [phone]        The results of significant diagnostics from this  hospitalization (including imaging, microbiology, ancillary and laboratory) are listed below for reference.    Significant Diagnostic Studies: Dg Chest 2 View  04/09/2015  CLINICAL DATA:  Bilateral leg swelling with pain in the posterior leg since last night. PICC line. History of hypertension and diabetes. Recent shoulder surgery. EXAM: CHEST  2 VIEW COMPARISON:  03/31/2015 FINDINGS: Interval placement of a right central venous catheter with tip over the low SVC region. No pneumothorax. Mild cardiac enlargement with mild diffuse pulmonary vascular congestion. Interstitial changes in the lungs suggesting interstitial edema. Small bilateral pleural effusions with basilar atelectasis. Anterior inferior appearance of the humeral head with respect to the glenoid fossa may be postoperative but could indicate anterior dislocation of the right shoulder. IMPRESSION: Right central venous catheter appears in satisfactory position. No pneumothorax. Pulmonary vascular congestion with interstitial edema, small bilateral pleural effusions, and basilar atelectasis. Possible anterior dislocation of the right shoulder. Electronically Signed   By: Lucienne Capers M.D.   On: 04/09/2015 02:06   Ct Angio Chest Pe W/cm &/or Wo Cm  04/13/2015  CLINICAL DATA:  Shortness of breath and elevated D-dimer. EXAM: CT ANGIOGRAPHY CHEST WITH CONTRAST TECHNIQUE: Multidetector CT imaging of the chest was performed using the standard protocol during bolus administration of intravenous contrast. Multiplanar CT image reconstructions and MIPs were obtained to evaluate the vascular anatomy. CONTRAST:  62mL OMNIPAQUE IOHEXOL 350 MG/ML SOLN COMPARISON:  None. FINDINGS: THORACIC INLET/BODY WALL: Body wall edema. Tunneled right IJ line with tip at the upper cavoatrial junction. MEDIASTINUM: Normal heart size. No pericardial effusion. No acute vascular abnormality including pulmonary embolism or aortic dissection. No adenopathy. LUNG WINDOWS: There is  symmetric central ground-glass opacity with diffuse interlobular septal thickening. Small borderline moderate pleural effusions with fissural extension on the right and mild lung scalloping suggesting early loculation. UPPER ABDOMEN: No acute findings. OSSEOUS: Large right glenohumeral joint effusion which is partially visualized, known based on previous MRI. There is persistent expansion and edematous appearance of the rotator cuff muscles. No erosive changes seen in the visualized joint. 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 Mass measures 4 cm in diameter and 8 cm in length. Review of the MIP images confirms the above findings. IMPRESSION: 1. Negative for pulmonary embolism. 2. Pulmonary edema. Small to moderate bilateral pleural effusion with signs of early loculation. Mild atelectasis. 3. Septic arthritis of the right glenohumeral joint with large effusion that is grossly similar to 04/06/2015 MRI. Electronically Signed   By: Monte Fantasia M.D.   On: 04/13/2015 05:27   Mr Humerus Right Wo Contrast  04/06/2015  CLINICAL DATA:  Pain and swelling of the right upper extremity EXAM: MRI OF THE RIGHT HUMERUS WITHOUT CONTRAST TECHNIQUE: Multiplanar, multisequence MR imaging was performed. No intravenous contrast was administered. COMPARISON:  03/29/2015 FINDINGS: Again observed is a large glenohumeral joint effusion with edema tracking along adjacent muscle plans including the deltoid and rotator cuff musculature. The deltoid edema is most confluent anteriorly and there is a small amount of edema tracking along the superficial margin of the pectoralis major muscle. Edema tracks along the superficial fascia margin of the right rib cage. Fluid tracks along the superficial fascia margins of the biceps and triceps. There is muscular edema of the lateral head of the triceps and to a lesser extent medially in the brachialis. No significant elbow joint  effusion although there is considerable subcutaneous edema along the elbow. I do not observe a drainable abscess. Edema tracking along the superficial fascia margins extends into the forearm. IMPRESSION: 1. There continues to be a right glenohumeral joint effusion which could be septic. Considerable edema in the surrounding rotator cuff and deltoid musculature. 2. Infiltrative edema also tracks along the margins (primarily the superficial fascia margins) of the pectoralis, biceps, and triceps musculature. There is no elbow joint effusion. Subcutaneous edema tracks into the forearm. I do not see a discrete drainable abscess. Potential myositis involving the rotator cuff musculature, anterior deltoid, and lateral head of the triceps. Electronically Signed   By: Van Clines M.D.   On: 04/06/2015 07:14   US Soft Tissue Head/neck  03/27/2015  CLINICAL DATA:  Left-sided neck pain EXAM: ULTRASOUND LEFT NECK TECHNIQUE: Longitudinal and transverse images of the left neck region in the area of pain performed COMPARISON:  None. FINDINGS: Sonographic images of the left neck show no mass or inflammatory focus. No abnormal fluid collection. In particular, no abscess. Several tiny lymph nodes are noted in this area; by size criteria, there is no adenopathy in this area. Vascular structures of this area are widely patent. IMPRESSION: Scattered subcentimeter lymph nodes in the left neck, not meeting size criteria for pathologic significance. No mass or inflammatory focus appreciable. No abscess in particular. Electronically Signed   By: Lowella Grip III M.D.   On: 03/27/2015 17:19   [place] Chest  03/31/2015  CLINICAL DATA:  50 year old female with chest x-ray demonstrating right-sided small pleural effusion. EXAM: CHEST ULTRASOUND COMPARISON:  None. FINDINGS: Ultrasound survey of the right chest demonstrating small complex pleural effusion, not amenable to ultrasound-guided drainage. IMPRESSION: Small, complex  appearing right pleural effusion not amenable for ultrasound-guided thoracentesis. Signed, Dulcy Fanny. Earleen Newport, DO [facility] Electronically Signed   By: [name] D.O.   On: 03/31/2015 11:05   US Abdomen Complete  03/27/2015  CLINICAL DATA:  Septic, generalized upper and lower abdominal pain, positive pregnancy test EXAM: ABDOMEN ULTRASOUND COMPLETE COMPARISON:  None FINDINGS: Gallbladder: Dependent echogenic material with minimal shadowing likely calculi in gallbladder. No gallbladder wall thickening, pericholecystic fluid, or sonographic Murphy sign. Common bile duct: Diameter: Normal caliber 4 mm diameter Liver: Normal appearance IVC: Normal appearance Pancreas: Distal tail obscured by bowel gas. Visualized portion normal appearance. Spleen: Normal appearance, 8.5 cm length Right Kidney: Length: 12.5 cm. Increased cortical echogenicity. Normal cortical thickness. No mass or hydronephrosis. Left Kidney: Length: 12.7 cm. Increased cortical echogenicity. Normal cortical thickness. No mass or hydronephrosis. Abdominal aorta: Normal caliber Other findings: No free fluid IMPRESSION: Probable medical renal disease changes. Probable cholelithiasis without evidence acute cholecystitis. Remainder of exam unremarkable. Electronically Signed  By: Lavonia Dana M.D.   On: 03/27/2015 17:29   US Ob Comp Less 14 Wks  03/27/2015  CLINICAL DATA:  Low beta HCG of 27. Abdominal pain with sepsis. Elevated white blood cell count. EXAM: OBSTETRIC <14 WK [place] AND TRANSVAGINAL OB US TECHNIQUE: Both transabdominal and transvaginal ultrasound examinations were performed for complete evaluation of the gestation as well as the maternal uterus, adnexal regions, and pelvic cul-de-sac. Transvaginal technique was performed to assess early pregnancy. COMPARISON:  Ultrasound 02/13/2012 FINDINGS: Intrauterine gestational sac: None Yolk sac:  Not present Embryo:  Not present Maternal  uterus/adnexae: The uterus is lobular and heterogeneous echotexture consistent multiple leiomyoma. This appears similar to the ultrasound of 2013. The endometrium is difficult to identified on the background of the multiple leiomyoma but measures 8 mm. Additionally, the ovaries are not identified. There is reported history of a LEFT oophorectomy in 2008. No free fluid. IMPRESSION: 1. Leiomyomatous uterus similar to 2013. 2. No decidual reaction or gestational sac identified. 3. No intrauterine gestational sac, yolk sac, or fetal pole identified. Differential considerations include intrauterine pregnancy too early to be sonographically visualized, missed abortion, or ectopic pregnancy. Followup ultrasound is recommended in 10-14 days for further evaluation. Electronically Signed   By: Suzy Bouchard M.D.   On: 03/27/2015 18:07   US Ob Transvaginal  03/27/2015  CLINICAL DATA:  Low beta HCG of 27. Abdominal pain with sepsis. Elevated white blood cell count. EXAM: OBSTETRIC <14 WK [place] AND TRANSVAGINAL OB US TECHNIQUE: Both transabdominal and transvaginal ultrasound examinations were performed for complete evaluation of the gestation as well as the maternal uterus, adnexal regions, and pelvic cul-de-sac. Transvaginal technique was performed to assess early pregnancy. COMPARISON:  Ultrasound 02/13/2012 FINDINGS: Intrauterine gestational sac: None Yolk sac:  Not present Embryo:  Not present Maternal uterus/adnexae: The uterus is lobular and heterogeneous echotexture consistent multiple leiomyoma. This appears similar to the ultrasound of 2013. The endometrium is difficult to identified on the background of the multiple leiomyoma but measures 8 mm. Additionally, the ovaries are not identified. There is reported history of a LEFT oophorectomy in 2008. No free fluid. IMPRESSION: 1. Leiomyomatous uterus similar to 2013. 2. No decidual reaction or gestational sac identified. 3. No intrauterine gestational sac, yolk sac, or  fetal pole identified. Differential considerations include intrauterine pregnancy too early to be sonographically visualized, missed abortion, or ectopic pregnancy. Followup ultrasound is recommended in 10-14 days for further evaluation. Electronically Signed   By: Suzy Bouchard M.D.   On: 03/27/2015 18:07   Ct Angio Ao+bifem W/cm &/or Wo/cm  04/09/2015  CLINICAL DATA:  Increased swelling of the right calf and fever. Patient was just discharged for bacteremia. EXAM: CT ANGIOGRAPHY OF ABDOMINAL AORTA WITH ILIOFEMORAL RUNOFF TECHNIQUE: Multidetector CT imaging of the abdomen, pelvis and lower extremities was performed using the standard protocol during bolus administration of intravenous contrast. Multiplanar CT image reconstructions and MIPs were obtained to evaluate the vascular anatomy. CONTRAST:  166mL OMNIPAQUE IOHEXOL 350 MG/ML SOLN COMPARISON:  None. FINDINGS: Aorta: Normal caliber abdominal aorta. No evidence of aortic dissection. The abdominal aorta, celiac axis, superior mesenteric artery, bilateral renal arteries, inferior mesenteric artery, and bilateral iliac, external iliac, internal iliac, and common femoral arteries are patent. Renal nephrograms are symmetrical. Right Lower Extremity: The right common femoral, deep femoral, superficial femoral, popliteal, tibial trunk, and tibial trifurcation vessels are patent to the right ankle. No evidence of significant vascular occlusion. Left Lower Extremity: The left common femoral, deep femoral, superficial femoral, popliteal, tibial  trunk, and tibial trifurcation vessels are patent to the left ankle. No evidence of significant vascular occlusion. With Abdomen: Small bilateral pleural effusions with basilar atelectasis. Cholelithiasis with small stones in the gallbladder. No gallbladder wall thickening or bile duct dilatation. The liver, spleen, pancreas, adrenal glands, kidneys, inferior vena cava, and retroperitoneal lymph nodes are unremarkable. There  is somewhat hazy infiltration in the retroperitoneal fat. This could represent retroperitoneal fibrosis or inflammatory changes. No discrete abscess. Retroperitoneal lymph nodes are likely reactive. Stomach, small bowel, and colon are decompressed. No free air or free fluid in the abdomen. Is edema in the subcutaneous fat. Pelvis: Diffuse enlargement of the uterus with multiple calcified fibroids. No abnormal adnexal masses. Appendix is normal. Small amount of free fluid in the pelvis. Bladder wall is not thickened. No pelvic lymphadenopathy. Degenerative changes in the lumbar spine. No destructive bone lesions. Lower extremities: Edema is demonstrated in the subcutaneous fat of the lower legs bilaterally. This is greater on the right. In the posterior compartment musculature of the right calf beginning at the level of the knee, there is a heterogeneous collection measuring 1.7 x 4.2 cm. This could represent abscess or hematoma. Consider ultrasound for further evaluation if clinically indicated. Small right popliteal cyst. Small left popliteal cyst. Bones appear intact. No evidence of acute fracture or bone erosion. No evidence of osteomyelitis. Review of the MIP images confirms the above findings. IMPRESSION: Abdominal aorta and runoff vessels are patent bilaterally to the ankles. No evidence of vascular stenosis or occlusion. Small bilateral pleural effusions with basilar atelectasis in the lungs. Diffuse edema throughout the subcutaneous fat of the abdomen and of the lower legs, greater on the right. Heterogeneous fluid collection in the posterior compartment of the right lower leg beginning at the level of the knee suggesting hematoma or abscess. Cholelithiasis. Hazy infiltration of the retroperitoneal fat suggesting infectious/ inflammatory process versus retroperitoneal fibrosis. Electronically Signed   By: Lucienne Capers M.D.   On: 04/09/2015 02:55   Mr Shoulder Right Wo Contrast  03/30/2015  CLINICAL  DATA:  Joint pain most severe in the right shoulder. Sepsis. EXAM: MRI OF THE RIGHT SHOULDER WITHOUT CONTRAST TECHNIQUE: Multiplanar, multisequence MR imaging of the shoulder was performed. No intravenous contrast was administered. COMPARISON:  03/27/2015 FINDINGS: Rotator cuff: Prominent partial thickness articular surface tearing of the supraspinatus, with the articular surface portion retracted 1.7 cm on image 16 series 5, compatible with tendon delamination. Moderate tendinopathy of the remaining supraspinatus tendon. Similarly there is partial thickness articular surface tearing of the infraspinatus tendon with moderate infraspinatus tendinopathy. Moderate subscapularis tendinopathy is present along with potential partial thickness articular surface tearing Muscles: Abnormal edema tracks primarily along the marginal portions of the supraspinatus, subscapularis, teres minor, and teres major muscles. There is also low-level edema along the superficial fascia margin of the lateral deltoid. Biceps long head: Partial tearing or advanced tendinopathy of the intra-articular segment. Acromioclavicular Joint: Mild degenerative AC joint arthropathy. Subacromial morphology is type 2 (curved). Small but abnormal amount of fluid in the subacromial subdeltoid bursa. Glenohumeral Joint: Prominent glenohumeral joint effusion. Moderate degenerative chondral thinning along the glenohumeral joint. Labrum: Linear irregularity in the superior labrum with a blunted appearance, compatible with SLAP tear extending into the biceps anchor. Bones: Unusual flaring of the proximal humeral metadiaphysis, query prior fracture. Small right axillary lymph nodes are observed. IMPRESSION: 1. Large shoulder joint effusion with abnormal edema tracking within and along the margins of the regional musculature. In the setting of sepsis and new  onset shoulder pain, there is a high degree of concern for septic glenohumeral joint, and arthrocentesis is  likely indicated. 2. Partial thickness articular surface tearing of the supraspinatus and infraspinatus with tendon delamination. There is potentially mild partial thickness articular surface tearing of the subscapularis. Considerable tendinopathy of these tendons as well. 3. Advanced tendinopathy or partial tearing of the long head of the biceps. 4. Increased signal in the superior labrum compatible with SLAP tear extending into the biceps anchor. 5. Subacromial subdeltoid bursitis. 6. Somewhat unusual flaring of the proximal humeral metadiaphysis, query prior fracture. 7. Moderate degenerative glenohumeral arthropathy. These results will be called to the ordering clinician or representative by the Radiologist Assistant, and communication documented in the PACS or zVision Dashboard. Electronically Signed   By: Van Clines M.D.   On: 03/30/2015 07:28   Mr Tibia Fibula Right W Wo Contrast  04/16/2015  ADDENDUM REPORT: 04/16/2015 08:57 ADDENDUM: The original report was by Dr. Van Clines. The following addendum is by Dr. Van Clines: Please note that in the second sentence under the Findings and in the first Impression, the hemorrhagic Bakers cyst is on the *RIGHT* side, not the left. Also, a CT angiogram runoff was performed on 04/09/2015 which also showed the Baker's cyst/hematoma. At that time, the volume was 67 cc. On today's exam, the volume is 164 cc. Electronically Signed   By: Van Clines M.D.   On: 04/16/2015 08:57  04/16/2015  CLINICAL DATA:  Increased swelling with pain and erythema in the right lower leg. Bacteremia. EXAM: MRI OF LOWER RIGHT EXTREMITY WITHOUT AND WITH CONTRAST TECHNIQUE: Multiplanar, multisequence MR imaging of the right lower leg was performed both before and after administration of intravenous contrast. We centered high enough to include the knees and distal femurs but exclude the ankles in order to fully encompass the region of concern. Please note that  although this exam includes the knees, this is not an orthopedic knee assessment. CONTRAST:  83mL MULTIHANCE GADOBENATE DIMEGLUMINE 529 MG/ML IV SOLN COMPARISON:  None. FINDINGS: Moderate right knee effusion with mild synovitis. Complex Baker's cyst tracking caudad superficial to the medial head gastrocnemius on the left, size 15.6 by 6.7 by 3.0 cm, heterogeneous internal T2 signal with faintly heterogeneous T1 signal, likely representing blood products or less likely synovitis within this lesion. There is asymmetric edema in the medial head gastrocnemius and to a lesser extent the lateral head gastrocnemius on the right compared to the left. Diffuse subcutaneous edema around the knee and upper calf bilaterally but right greater than left. Edema tracks along the superficial fascia bilaterally and also to a lesser extent within the left anterior compartment. Degenerative osteochondral lesion along the lateral patellar facet on the right, along with spurring of the patella and mild marginal spurring of the medial compartment of the right knee. IMPRESSION: 1. Large left Baker's cyst extending caudad along the superficial fascia margin of the medial head gastrocnemius, with nonenhancing complex internal material favoring mixed hematoma. Abscess is considered significantly less likely given the lack of a thick enhancing rind. 2. There is some abnormal edema in the gastrocnemius musculature on the right, such that myositis is not excluded. Given that the hematoma tracks along the superficial fascia margin, the possibility of a shearing injury in this vicinity is raised, correlate with recent trauma. 3. Asymmetric moderate-sized right knee joint effusion, very faint synovitis. 4. Subcutaneous edema around the knees and upper calves, right greater than left. 5. Low-level edema and enhancement in the anterior compartment  of the left lower leg, most notably involving the extensor digitorum longus muscle. This is likely  indicative of inflammation but is otherwise nonspecific. Overall no effacement of anterior compartmental adipose tissue to suggest compartment syndrome on the left. 6. Degenerative findings in the right knee most notably in the patellofemoral joint. Electronically Signed: By: Van Clines M.D. On: 04/16/2015 06:45   Ir Fluoro Guide Cv Line Right  04/01/2015  CLINICAL DATA:  50 year old female with a history of septic arthritis. She has been referred for tunneled catheter placement for antibiotics. EXAM: IR RIGHT FLOURO GUIDE CV LINE; IR ULTRASOUND GUIDANCE VASC ACCESS RIGHT Date: 04/01/2015 ANESTHESIA/SEDATION: None Total sedation time: 0 minutes FLUOROSCOPY TIME:  30 second TECHNIQUE: The procedure, risks, benefits, and alternatives were explained to the patient. Questions regarding the procedure were encouraged and answered. The patient understands and consents to the procedure. The right neck and chest was prepped with chlorhexidine, and draped in the usual sterile fashion using maximum barrier technique (cap and mask, sterile gown, sterile gloves, large sterile sheet, hand hygiene and cutaneous antiseptic). Local anesthesia was attained by infiltration with 1% lidocaine without epinephrine. Ultrasound demonstrated patency of the right internal jugular vein, and this was documented with an image. Under real-time ultrasound guidance, this vein was accessed with a 21 gauge micropuncture needle and image documentation was performed. A small dermatotomy was made at the access site with an 11 scalpel. A 0.018" wire was advanced into the SVC and the access needle exchanged for a 103F micropuncture vascular sheath. The 0.018" wire was then removed and a 0.035" wire advanced into the IVC. Peel-away sheath was placed over the wire, and upon removal of the wire appropriate internal length was obtained/measured. Skin and subcutaneous tissues on the right chest wall just inferior to the clavicle were infiltrated with  1% lidocaine for local anesthesia. Small stab incision was made with 11 blade scalpel. The catheter was back tunneled to the incision at the neck, with the catheter pulled through the subcutaneous tunnel. Catheter was then ligated at the appropriate length at the neck and passed through the peel-away sheath. Peel-away sheath was removed. Final image was stored. Catheter was then sutured onto the right chest wall with retention sutures. The dermatotomy at the venous access site was also closed with Dermabond. Patient tolerated the procedure well and remained hemodynamically stable throughout. No complications were encountered and no significant blood loss was encountered. COMPLICATIONS: None IMPRESSION: Status post placement of right IJ tunneled catheter measuring 24 centimeter. Catheter ready for use. Signed, Dulcy Fanny. Earleen Newport, DO [facility]iology Electronically Signed   By: [name] D.O.   On: 04/01/2015 14:45   Ir US Guide Vasc Access Right  04/01/2015  CLINICAL DATA:  50 year old female with a history of septic arthritis. She has been referred for tunneled catheter placement for antibiotics. EXAM: IR RIGHT FLOURO GUIDE CV LINE; IR ULTRASOUND GUIDANCE VASC ACCESS RIGHT Date: 04/01/2015 ANESTHESIA/SEDATION: None Total sedation time: 0 minutes FLUOROSCOPY TIME:  30 second TECHNIQUE: The procedure, risks, benefits, and alternatives were explained to the patient. Questions regarding the procedure were encouraged and answered. The patient understands and consents to the procedure. The right neck and chest was prepped with chlorhexidine, and draped in the usual sterile fashion using maximum barrier technique (cap and mask, sterile gown, sterile gloves, large sterile sheet, hand hygiene and cutaneous antiseptic). Local anesthesia was attained by infiltration with 1% lidocaine without epinephrine. Ultrasound demonstrated patency of the right internal jugular  vein,  and this was documented with an image. Under real-time ultrasound guidance, this vein was accessed with a 21 gauge micropuncture needle and image documentation was performed. A small dermatotomy was made at the access site with an 11 scalpel. A 0.018" wire was advanced into the SVC and the access needle exchanged for a 15F micropuncture vascular sheath. The 0.018" wire was then removed and a 0.035" wire advanced into the IVC. Peel-away sheath was placed over the wire, and upon removal of the wire appropriate internal length was obtained/measured. Skin and subcutaneous tissues on the right chest wall just inferior to the clavicle were infiltrated with 1% lidocaine for local anesthesia. Small stab incision was made with 11 blade scalpel. The catheter was back tunneled to the incision at the neck, with the catheter pulled through the subcutaneous tunnel. Catheter was then ligated at the appropriate length at the neck and passed through the peel-away sheath. Peel-away sheath was removed. Final image was stored. Catheter was then sutured onto the right chest wall with retention sutures. The dermatotomy at the venous access site was also closed with Dermabond. Patient tolerated the procedure well and remained hemodynamically stable throughout. No complications were encountered and no significant blood loss was encountered. COMPLICATIONS: None IMPRESSION: Status post placement of right IJ tunneled catheter measuring 24 centimeter. Catheter ready for use. Signed, Dulcy Fanny. Earleen Newport, DO [facility] Radiology Electronically Signed   By: [name] D.O.   On: 04/01/2015 14:45   Dg Chest Port 1 View  04/12/2015  CLINICAL DATA:  Shortness of breath EXAM: PORTABLE CHEST 1 VIEW COMPARISON:  04/08/2014 FINDINGS: Right-sided central line in stable position with tip at the SVC level. Normal heart size for technique.  Stable aortic and hilar contours. Layering pleural effusions  causing hazy basilar opacity. Diffuse interstitial coarsening with cephalized blood flow. IMPRESSION: Mild pulmonary edema with layering pleural effusions. Electronically Signed   By: Monte Fantasia M.D.   On: 04/12/2015 22:39   Dg Chest Port 1 View  03/31/2015  CLINICAL DATA:  Acute onset of shortness of breath. Initial encounter. EXAM: PORTABLE CHEST 1 VIEW COMPARISON:  Chest radiograph from 03/27/2015 FINDINGS: A small to moderate right-sided pleural effusion is noted. Right basilar airspace opacity may reflect atelectasis or pneumonia. Asymmetric interstitial edema could have a similar appearance. Underlying vascular congestion is seen. No pneumothorax is identified. The cardiomediastinal silhouette is borderline enlarged. No acute osseous abnormalities are seen. IMPRESSION: 1. Small to moderate right-sided pleural effusion noted. Right basilar airspace opacity may reflect atelectasis or pneumonia. Asymmetric interstitial edema could have a similar appearance. 2. Underlying vascular congestion and borderline cardiomegaly. Electronically Signed   By: Garald Balding M.D.   On: 03/31/2015 01:49   Dg Chest Port 1 View  03/27/2015  CLINICAL DATA:  Fever for 3 days EXAM: PORTABLE CHEST 1 VIEW COMPARISON:  None. FINDINGS: Lungs are clear. Heart is upper normal in size with pulmonary vascularity within normal limits. No adenopathy. No bone lesions. IMPRESSION: No edema or consolidation. Electronically Signed   By: Lowella Grip III M.D.   On: 03/27/2015 15:43   Dg Fluoro Guided Needle Plc Aspiration/injection Loc  03/30/2015  CLINICAL DATA:  Prior MRI suspicious for septic glenohumeral joint. Sepsis. EXAM: FLUORO GUIDED NEEDLE PLACEMENT FLUOROSCOPY TIME:  If the device does not provide the exposure index: Fluoroscopy Time (in minutes and seconds):  1 minutes and 42 seconds Number of Acquired Images:  None COMPARISON:  MRI of 03/29/2015. FINDINGS: Informed written  and verbal consent were obtained. Risks and  benefits of the procedure were discussed. A "Time out" was performed. The superior medial portion of the right humeral head was localized under fluoroscopic guidance. Skin was prepped and draped in standard sterile fashion. Skin was numbed with 1% lidocaine. A 20 gauge needle was inserted into the joint. No fluid could be aspirated. Intra-articular location was confirmed with approximately 6 cc of Omnipaque 180. Subsequent, approximately 5 cc of saline were injected. Only minimal fluid could be returned. IMPRESSION: Uncomplicated aspiration of the right shoulder, as detailed above. Minimal fluid was obtained, and sent to laboratory as requested. Electronically Signed   By: Abigail Miyamoto M.D.   On: 03/30/2015 12:11    Microbiology: Recent Results (from the past 240 hour(s))  Culture, routine-abscess     Status: None   Collection Time: 04/09/15  4:01 AM  Result Value Ref Range Status   Specimen Description ABSCESS  Final   Special Requests RIGHT CALF  Final   Gram Stain   Final    ABUNDANT WBC PRESENT,BOTH PMN AND MONONUCLEAR NO SQUAMOUS EPITHELIAL CELLS SEEN NO ORGANISMS SEEN Performed at [facility]    Culture   Final    NO GROWTH 2 DAYS Performed at [facility]    Report Status 04/11/2015 FINAL  Final  Blood culture (routine x 2)     Status: None (Preliminary result)   Collection Time: 04/12/15 10:30 PM  Result Value Ref Range Status   Specimen Description BLOOD LEFT ARM  Final   Special Requests BOTTLES DRAWN AEROBIC AND ANAEROBIC 5CC  Final   Culture NO GROWTH 4 DAYS  Final   Report Status PENDING  Incomplete  Blood culture (routine x 2)     Status: None (Preliminary result)   Collection Time: 04/12/15 10:39 PM  Result Value Ref Range Status   Specimen Description BLOOD LEFT HAND  Final   Special Requests BOTTLES DRAWN AEROBIC AND ANAEROBIC 5CC  Final   Culture NO GROWTH 4 DAYS  Final   Report Status PENDING  Incomplete  Urine culture     Status: None    Collection Time: 04/12/15 11:28 PM  Result Value Ref Range Status   Specimen Description URINE, CLEAN CATCH  Final   Special Requests NONE  Final   Culture NO GROWTH 1 DAY  Final   Report Status 04/14/2015 FINAL  Final  MRSA PCR Screening     Status: None   Collection Time: 04/13/15  2:22 AM  Result Value Ref Range Status   MRSA by PCR NEGATIVE NEGATIVE Final    Comment:        The GeneXpert MRSA Assay (FDA approved for NASAL specimens only), is one component of a comprehensive MRSA colonization surveillance program. It is not intended to diagnose MRSA infection nor to guide or monitor treatment for MRSA infections.      Labs: Basic Metabolic Panel:  Recent Labs Lab 04/12/15 2153 04/14/15 0641 04/15/15 0450 04/16/15 0428 04/17/15 0623  NA 137 139 136 136 137  K 3.9 3.4* 3.9 4.4 3.8  CL 104 104 100* 101 99*  CO2 22 23 24 24 24   GLUCOSE 267* 159* 184* 179* 201*  BUN 11 8 9 8 6   CREATININE 1.01* 1.06* 1.12* 0.96 0.86  CALCIUM 8.7* 8.4* 8.2* 8.7* 9.1   Liver Function Tests:  Recent Labs Lab 04/12/15 2153  AST 20  ALT 14  ALKPHOS 80  BILITOT 0.6  PROT 7.6  ALBUMIN 1.8*  No results for input(s): LIPASE, AMYLASE in the last 168 hours. No results for input(s): AMMONIA in the last 168 hours. CBC:  Recent Labs Lab 04/12/15 2153 04/13/15 0816 04/13/15 2021 04/15/15 0450 04/16/15 0428 04/17/15 0623  WBC 9.9 7.6 9.0 8.4 8.1 6.8  NEUTROABS 7.6  --   --   --   --  4.8  HGB 7.0* 7.9* 8.5* 8.0* 8.3* 9.5*  HCT 21.6* 24.3* 26.6* 24.3* 25.0* 28.9*  MCV 84.7 84.1 84.7 85.6 85.0 85.0  PLT 474* 415* 380 297 325 299   Cardiac Enzymes: No results for input(s): CKTOTAL, CKMB, CKMBINDEX, TROPONINI in the last 168 hours. BNP: BNP (last 3 results)  Recent Labs  04/08/15 2246 04/12/15 0049  BNP 259.3* 308.7*    ProBNP (last 3 results) No results for input(s): PROBNP in the last 8760 hours.  CBG:  Recent Labs Lab 04/16/15 0727 04/16/15 1215  04/16/15 1638 04/16/15 2105 04/17/15 0739  GLUCAP 164* 190* 155* 185* 173*       Signed:  Delfina Redwood MD.  [facility] 04/17/2015, 8:09 AM

## 2015-04-18 LAB — TYPE AND SCREEN
ABO/RH(D): O POS
ANTIBODY SCREEN: NEGATIVE
UNIT DIVISION: 0

## 2015-04-19 ENCOUNTER — Encounter (HOSPITAL_COMMUNITY): Payer: Self-pay | Admitting: Gastroenterology

## 2015-04-19 LAB — PATHOLOGIST SMEAR REVIEW

## 2015-04-20 ENCOUNTER — Ambulatory Visit (INDEPENDENT_AMBULATORY_CARE_PROVIDER_SITE_OTHER): Payer: Managed Care, Other (non HMO) | Admitting: Infectious Diseases

## 2015-04-20 ENCOUNTER — Encounter: Payer: Self-pay | Admitting: Infectious Diseases

## 2015-04-20 ENCOUNTER — Telehealth: Payer: Self-pay | Admitting: *Deleted

## 2015-04-20 VITALS — BP 150/89 | HR 105 | Temp 99.1°F | Wt 166.0 lb

## 2015-04-20 DIAGNOSIS — B954 Other streptococcus as the cause of diseases classified elsewhere: Secondary | ICD-10-CM

## 2015-04-20 DIAGNOSIS — B955 Unspecified streptococcus as the cause of diseases classified elsewhere: Secondary | ICD-10-CM

## 2015-04-20 DIAGNOSIS — M660 Rupture of popliteal cyst: Secondary | ICD-10-CM | POA: Insufficient documentation

## 2015-04-20 DIAGNOSIS — N19 Unspecified kidney failure: Secondary | ICD-10-CM | POA: Diagnosis not present

## 2015-04-20 DIAGNOSIS — R110 Nausea: Secondary | ICD-10-CM | POA: Diagnosis not present

## 2015-04-20 DIAGNOSIS — R7881 Bacteremia: Secondary | ICD-10-CM | POA: Diagnosis not present

## 2015-04-20 MED ORDER — LEVOFLOXACIN 750 MG PO TABS: 750.0000 mg | ORAL_TABLET | Freq: Every day | ORAL | Status: DC

## 2015-04-20 MED ORDER — ONDANSETRON HCL 4 MG PO TABS: 4.0000 mg | ORAL_TABLET | Freq: Three times a day (TID) | ORAL | Status: DC | PRN

## 2015-04-20 NOTE — Assessment & Plan Note (Signed)
I am very concerned about her swelling, hematoma, baker's cyst in her R leg.  She has ortho f/u tomorrow. Would await their opinion on this.

## 2015-04-20 NOTE — Assessment & Plan Note (Signed)
Last Cr was normal. Will mark as resolved.

## 2015-04-20 NOTE — Progress Notes (Signed)
Subjective:    Patient ID: [name], female    DOB: [birth year], 50 y.o.   MRN: [id number]  HPI 50 y.o. female with hx of diet controlled DM, who had sudden onset of rigors, followed by epigastric pain, nausea, vomiting and mild diarrhea. She then began to have severe left-sided posterior neck pain leading her to come to the hospital March 27, 2015. She had low-grade fever of 100.5 and acute renal insufficiency. She was started on empiric daptomycin and piperacillin tazobactam (vancomycin was avoided because of concerns about potential nephrotoxicity). Her blood cultures and UCx grew Group A Strep (R- clinda, erythro).  She was found on MRI of R humerus (1-16) to have 1. Large shoulder joint effusion with abnormal edema tracking within and along the margins of the regional musculature. In the setting of sepsis and new onset shoulder pain, there is a high degree of concern for septic glenohumeral joint, and arthrocentesis is likely indicated. 2. Partial thickness articular surface tearing of the supraspinatus and infraspinatus with tendon delamination. There is potentially mild partial thickness articular surface tearing of the subscapularis. Considerable tendinopathy of these tendons as well. 3. Advanced tendinopathy or partial tearing of the long head of the biceps. 4. Increased signal in the superior labrum compatible with SLAP tear extending into the biceps anchor. 5. Subacromial subdeltoid bursitis. 6. Somewhat unusual flaring of the proximal humeral metadiaphysis, query prior fracture. 7. Moderate degenerative glenohumeral arthropathy. She was taken to OR on 1-17 and underwent I & D of R shoulder.  She was changed to IV Pen G and was d/c home on 1-25 with expected completion date of 1-25.  She feels like she is not doing well. She has been having low grade fever (99.3-100.3) and nausea for the last several days.  She still has limitation of movement of her R shoulder. No  problems with her PIC or with infusion.  Has f/u with Dr Percell Miller tomorrow.   Has had RLE swelling since she was d/c from hospital.  initailly felt like she had muscle. She was seen in ED and was told she has hematoma, baker's cyst. MRI done 2-2 showed   CLINICAL DATA: Increased swelling with pain and erythema in the right lower leg. Bacteremia.  EXAM: MRI OF LOWER RIGHT EXTREMITY WITHOUT AND WITH CONTRAST  TECHNIQUE: Multiplanar, multisequence MR imaging of the right lower leg was performed both before and after administration of intravenous contrast. We centered high enough to include the knees and distal femurs but exclude the ankles in order to fully encompass the region of concern. Please note that although this exam includes the knees, this is not an orthopedic knee assessment.  CONTRAST: 67mL MULTIHANCE GADOBENATE DIMEGLUMINE 529 MG/ML IV SOLN  COMPARISON: None.  FINDINGS: Moderate right knee effusion with mild synovitis. Complex Baker's cyst tracking caudad superficial to the medial head gastrocnemius on the left, size 15.6 by 6.7 by 3.0 cm, heterogeneous internal T2 signal with faintly heterogeneous T1 signal, likely representing blood products or less likely synovitis within this lesion. There is asymmetric edema in the medial head gastrocnemius and to a lesser extent the lateral head gastrocnemius on the right compared to the left. Diffuse subcutaneous edema around the knee and upper calf bilaterally but right greater than left. Edema tracks along the superficial fascia bilaterally and also to a lesser extent within the left anterior compartment.  Degenerative osteochondral lesion along the lateral patellar facet on the right, along with spurring of the patella and mild marginal spurring of  the medial compartment of the right knee.  IMPRESSION: 1. Large left Baker's cyst extending caudad along the superficial fascia margin of the medial head  gastrocnemius, with nonenhancing complex internal material favoring mixed hematoma. Abscess is considered significantly less likely given the lack of a thick enhancing rind. 2. There is some abnormal edema in the gastrocnemius musculature on the right, such that myositis is not excluded. Given that the hematoma tracks along the superficial fascia margin, the possibility of a shearing injury in this vicinity is raised, correlate with recent trauma. 3. Asymmetric moderate-sized right knee joint effusion, very faint synovitis. 4. Subcutaneous edema around the knees and upper calves, right greater than left. 5. Low-level edema and enhancement in the anterior compartment of the RIGHT lower leg, most notably involving the extensor digitorum longus muscle. This is likely indicative of inflammation but is otherwise nonspecific. Overall no effacement of anterior compartmental adipose tissue to suggest compartment syndrome on the left. 6. Degenerative findings in the right knee most notably in the patellofemoral joint.   Review of Systems  Constitutional: Positive for fever and appetite change. Negative for chills and unexpected weight change.  Cardiovascular: Positive for leg swelling.  Gastrointestinal: Negative for diarrhea and constipation.  Genitourinary: Negative for difficulty urinating.  Musculoskeletal: Positive for myalgias and arthralgias.  Has been eating poorly, taking glucerna. FSG have been 140-145 today.      Objective:   Physical Exam  Constitutional: She appears well-developed and well-nourished.  HENT:  Mouth/Throat: No oropharyngeal exudate.  Eyes: EOM are normal. Pupils are equal, round, and reactive to light.  Neck: Neck supple.  Cardiovascular: Normal rate, regular rhythm and normal heart sounds.   Pulmonary/Chest: Effort normal and breath sounds normal.    Abdominal: Soft. Bowel sounds are normal. There is no tenderness. There is no rebound.  Musculoskeletal:         Legs: Lymphadenopathy:    She has no cervical adenopathy.      Assessment & Plan:

## 2015-04-20 NOTE — Telephone Encounter (Signed)
Per Dr Johnnye Sima gave [place] at [facility] to D/C PICC today.

## 2015-04-20 NOTE — Addendum Note (Signed)
Addended by: Mahonri Seiden C on: 04/20/2015 04:46 PM   Modules accepted: Orders

## 2015-04-20 NOTE — Assessment & Plan Note (Addendum)
She believes she is having an ADR.  Will stop her PEN and switch her to levaquin.  Will pull her PIC line Will see her back in 1 week.

## 2015-04-21 ENCOUNTER — Other Ambulatory Visit: Payer: Self-pay | Admitting: Infectious Diseases

## 2015-04-21 DIAGNOSIS — B954 Other streptococcus as the cause of diseases classified elsewhere: Secondary | ICD-10-CM

## 2015-04-21 DIAGNOSIS — R7881 Bacteremia: Secondary | ICD-10-CM

## 2015-04-21 NOTE — Telephone Encounter (Signed)
Notified [place] and patient that her appointment to have her tunneled picc line removed is Friday 04/23/15 at 11:30 AM at [facility] Interventional Radiology. Myrtis Hopping

## 2015-04-22 ENCOUNTER — Ambulatory Visit (HOSPITAL_COMMUNITY)
Admission: RE | Admit: 2015-04-22 | Discharge: 2015-04-22 | Disposition: A | Discharge status: Home / Self Care | Payer: Managed Care, Other (non HMO) | Source: Ambulatory Visit | Attending: Orthopedic Surgery | Admitting: Orthopedic Surgery

## 2015-04-22 ENCOUNTER — Other Ambulatory Visit (HOSPITAL_COMMUNITY): Payer: Self-pay | Admitting: Orthopedic Surgery

## 2015-04-22 ENCOUNTER — Ambulatory Visit (HOSPITAL_COMMUNITY): Admission: RE | Admit: 2015-04-22 | Payer: Managed Care, Other (non HMO) | Source: Ambulatory Visit

## 2015-04-22 DIAGNOSIS — M7989 Other specified soft tissue disorders: Secondary | ICD-10-CM

## 2015-04-22 DIAGNOSIS — S8011XA Contusion of right lower leg, initial encounter: Secondary | ICD-10-CM | POA: Insufficient documentation

## 2015-04-22 DIAGNOSIS — X58XXXA Exposure to other specified factors, initial encounter: Secondary | ICD-10-CM | POA: Insufficient documentation

## 2015-04-22 DIAGNOSIS — M7121 Synovial cyst of popliteal space [Baker], right knee: Secondary | ICD-10-CM

## 2015-04-22 DIAGNOSIS — M79604 Pain in right leg: Secondary | ICD-10-CM

## 2015-04-22 DIAGNOSIS — T148 Other injury of unspecified body region: Secondary | ICD-10-CM | POA: Diagnosis present

## 2015-04-22 LAB — BODY FLUID CELL COUNT WITH DIFFERENTIAL
APPEARANCE FL: UNDETERMINED — AB
COLOR FL: UNDETERMINED — AB
Eos, Fluid: UNDETERMINED %
LYMPHS FL: UNDETERMINED %
Monocyte-Macrophage-Serous Fluid: UNDETERMINED % (ref 50–90)
NEUTROPHIL FLUID: UNDETERMINED % (ref 0–25)
Other Cells, Fluid: UNDETERMINED %
Total Nucleated Cell Count, Fluid: UNDETERMINED cu mm (ref 0–1000)

## 2015-04-22 NOTE — Procedures (Signed)
US guided aspiration of the right calf hematoma.  Only 8 ml of bloody fluid could be removed.  The collection is very complex on [place].  Collection has multiple echogenic and cannot exclude gas within the collection.

## 2015-04-23 ENCOUNTER — Ambulatory Visit (HOSPITAL_COMMUNITY)
Admission: RE | Admit: 2015-04-23 | Discharge: 2015-04-23 | Disposition: A | Discharge status: Home / Self Care | Payer: Managed Care, Other (non HMO) | Source: Ambulatory Visit | Attending: Infectious Diseases | Admitting: Infectious Diseases

## 2015-04-23 DIAGNOSIS — Z452 Encounter for adjustment and management of vascular access device: Secondary | ICD-10-CM | POA: Diagnosis not present

## 2015-04-23 DIAGNOSIS — B954 Other streptococcus as the cause of diseases classified elsewhere: Secondary | ICD-10-CM | POA: Insufficient documentation

## 2015-04-23 DIAGNOSIS — R7881 Bacteremia: Secondary | ICD-10-CM | POA: Diagnosis not present

## 2015-04-23 MED ORDER — CHLORHEXIDINE GLUCONATE 4 % EX LIQD
CUTANEOUS | Status: AC
Start: 2015-04-23 — End: 2015-04-23
  Filled 2015-04-23: qty 15

## 2015-04-23 MED ORDER — LIDOCAINE HCL 1 % IJ SOLN
INTRAMUSCULAR | Status: AC
  Filled 2015-04-23: qty 20

## 2015-04-23 NOTE — Procedures (Signed)
Successful removal of tunneled CVC.  No immediate complications.  Ronny Bacon, MD Pager #: [phone]

## 2015-04-26 LAB — BODY FLUID CULTURE: CULTURE: NO GROWTH

## 2015-04-27 ENCOUNTER — Encounter: Payer: Self-pay | Admitting: *Deleted

## 2015-04-27 ENCOUNTER — Ambulatory Visit (INDEPENDENT_AMBULATORY_CARE_PROVIDER_SITE_OTHER): Payer: Managed Care, Other (non HMO) | Admitting: Infectious Diseases

## 2015-04-27 ENCOUNTER — Ambulatory Visit (HOSPITAL_COMMUNITY)
Admission: RE | Admit: 2015-04-27 | Discharge: 2015-04-27 | Disposition: A | Discharge status: Home / Self Care | Payer: Managed Care, Other (non HMO) | Source: Ambulatory Visit | Attending: Infectious Diseases | Admitting: Infectious Diseases

## 2015-04-27 ENCOUNTER — Encounter: Payer: Self-pay | Admitting: Infectious Diseases

## 2015-04-27 VITALS — BP 126/81 | HR 108 | Temp 98.3°F | Ht 66.0 in | Wt 159.0 lb

## 2015-04-27 DIAGNOSIS — M7121 Synovial cyst of popliteal space [Baker], right knee: Secondary | ICD-10-CM | POA: Diagnosis not present

## 2015-04-27 DIAGNOSIS — M00211 Other streptococcal arthritis, right shoulder: Secondary | ICD-10-CM

## 2015-04-27 DIAGNOSIS — M79604 Pain in right leg: Secondary | ICD-10-CM | POA: Insufficient documentation

## 2015-04-27 DIAGNOSIS — M7989 Other specified soft tissue disorders: Secondary | ICD-10-CM | POA: Diagnosis not present

## 2015-04-27 DIAGNOSIS — M660 Rupture of popliteal cyst: Secondary | ICD-10-CM | POA: Insufficient documentation

## 2015-04-27 LAB — FUNGUS CULTURE W SMEAR: Fungal Smear: NONE SEEN

## 2015-04-27 NOTE — Progress Notes (Signed)
*  PRELIMINARY RESULTS* Vascular Ultrasound Right lower extremity venous duplex has been completed.  Preliminary findings: no evidence of DVT. Large baker's cyst noted with internal echoes, extending from the popliteal fossa to mid calf.  Called results to Dr. Algis Downs office.   Landry Mellow, RDMS, RVT  04/27/2015, 3:27 PM

## 2015-04-27 NOTE — Assessment & Plan Note (Signed)
This appears to be healing however she has limitation of movement.  Will continue her levaquin for 2 more weeks and have her back then.

## 2015-04-27 NOTE — Telephone Encounter (Signed)
error 

## 2015-04-27 NOTE — Addendum Note (Signed)
Addended by: Yeily Link C on: 04/27/2015 02:34 PM   Modules accepted: Orders

## 2015-04-27 NOTE — Progress Notes (Signed)
   Subjective:    Patient ID: [name], female    DOB: 1965/10/04, 50 y.o.   MRN: [id number]  HPI 50 y.o. female with hx of diet controlled DM, who had sudden onset of rigors, followed by epigastric pain, nausea, vomiting and mild diarrhea. She then began to have severe left-sided posterior neck pain leading her to come to the hospital March 27, 2015. She had low-grade fever of 100.5 and acute renal insufficiency. She was started on empiric daptomycin and piperacillin tazobactam (vancomycin was avoided because of concerns about potential nephrotoxicity). Her blood cultures and UCx grew Group A Strep (R- clinda, erythro).  She was found on MRI of R humerus (1-16) to have 1. Large shoulder joint effusion with abnormal edema tracking within and along the margins of the regional musculature. In the setting of sepsis and new onset shoulder pain, there is a high degree of concern for septic glenohumeral joint, and arthrocentesis is likely indicated. 2. Partial thickness articular surface tearing of the supraspinatus and infraspinatus with tendon delamination. There is potentially mild partial thickness articular surface tearing of the subscapularis. Considerable tendinopathy of these tendons as well. 3. Advanced tendinopathy or partial tearing of the long head of the biceps. 4. Increased signal in the superior labrum compatible with SLAP tear extending into the biceps anchor. 5. Subacromial subdeltoid bursitis. 6. Somewhat unusual flaring of the proximal humeral metadiaphysis, query prior fracture. 7. Moderate degenerative glenohumeral arthropathy. She was taken to OR on 1-17 and underwent I & D of R shoulder.  She was changed to IV Pen G and was d/c home on 1-25.  She was seen in ID on 2-7 and her IV Pen G was stopped as she felt like she was having a reaction.  She was then changed to levaquin.  She had also been seen in ED prior to that for RLE swelling and was noted to have  hematoma/Baker's cyst. She had ortho f/u on 2-8 and she had aspirate. Since then she has had bloody oozing.  Has had f/c, temp up to 99.8.  Still on levaquin.  She continues to have pain with mvmt of her R shoulder. She also is unable to lift her shoulder without assist from her L arm.   Review of Systems  Constitutional: Positive for fever, chills, appetite change and unexpected weight change.  Gastrointestinal: Negative for diarrhea and constipation.  Genitourinary: Negative for vaginal discharge and difficulty urinating.  Musculoskeletal: Positive for myalgias.       Objective:   Physical Exam  Constitutional: She appears well-developed and well-nourished.  Musculoskeletal:       Arms:      Legs:         Assessment & Plan:

## 2015-04-27 NOTE — Assessment & Plan Note (Signed)
I'm very concerned about DVT and her continued swelling.  Will have her get u/s in the next 24h.  continue her anbx for now.

## 2015-05-03 ENCOUNTER — Encounter: Payer: Self-pay | Admitting: Infectious Diseases

## 2015-05-03 ENCOUNTER — Telehealth: Payer: Self-pay | Admitting: *Deleted

## 2015-05-03 NOTE — Telephone Encounter (Signed)
Pt was placed on Fe Supplement by Dr. Delfina Redwood.

## 2015-05-05 ENCOUNTER — Inpatient Hospital Stay (HOSPITAL_COMMUNITY): Payer: Managed Care, Other (non HMO) | Admitting: Certified Registered Nurse Anesthetist

## 2015-05-05 ENCOUNTER — Other Ambulatory Visit: Payer: Self-pay | Admitting: *Deleted

## 2015-05-05 ENCOUNTER — Encounter (HOSPITAL_COMMUNITY)
Admission: AD | Disposition: A | Discharge status: Home / Self Care | Payer: Self-pay | Source: Ambulatory Visit | Attending: Orthopedic Surgery

## 2015-05-05 ENCOUNTER — Inpatient Hospital Stay (HOSPITAL_COMMUNITY)
Admission: AD | Admit: 2015-05-05 | Discharge: 2015-05-07 | DRG: 488 | Disposition: A | Discharge status: Home / Self Care | Payer: Managed Care, Other (non HMO) | Source: Ambulatory Visit | Attending: Orthopedic Surgery | Admitting: Orthopedic Surgery

## 2015-05-05 ENCOUNTER — Encounter (HOSPITAL_COMMUNITY): Payer: Self-pay | Admitting: Anesthesiology

## 2015-05-05 ENCOUNTER — Encounter (HOSPITAL_COMMUNITY): Payer: Self-pay | Admitting: Certified Registered Nurse Anesthetist

## 2015-05-05 DIAGNOSIS — D638 Anemia in other chronic diseases classified elsewhere: Secondary | ICD-10-CM | POA: Diagnosis present

## 2015-05-05 DIAGNOSIS — E1165 Type 2 diabetes mellitus with hyperglycemia: Secondary | ICD-10-CM | POA: Diagnosis present

## 2015-05-05 DIAGNOSIS — E118 Type 2 diabetes mellitus with unspecified complications: Secondary | ICD-10-CM | POA: Insufficient documentation

## 2015-05-05 DIAGNOSIS — E0801 Diabetes mellitus due to underlying condition with hyperosmolarity with coma: Secondary | ICD-10-CM | POA: Diagnosis not present

## 2015-05-05 DIAGNOSIS — M00861 Arthritis due to other bacteria, right knee: Secondary | ICD-10-CM

## 2015-05-05 DIAGNOSIS — D62 Acute posthemorrhagic anemia: Secondary | ICD-10-CM | POA: Diagnosis not present

## 2015-05-05 DIAGNOSIS — Z792 Long term (current) use of antibiotics: Secondary | ICD-10-CM

## 2015-05-05 DIAGNOSIS — M7121 Synovial cyst of popliteal space [Baker], right knee: Secondary | ICD-10-CM | POA: Diagnosis present

## 2015-05-05 DIAGNOSIS — B950 Streptococcus, group A, as the cause of diseases classified elsewhere: Secondary | ICD-10-CM | POA: Diagnosis present

## 2015-05-05 DIAGNOSIS — E0811 Diabetes mellitus due to underlying condition with ketoacidosis with coma: Secondary | ICD-10-CM | POA: Diagnosis not present

## 2015-05-05 DIAGNOSIS — I10 Essential (primary) hypertension: Secondary | ICD-10-CM | POA: Diagnosis present

## 2015-05-05 DIAGNOSIS — R7881 Bacteremia: Secondary | ICD-10-CM | POA: Diagnosis present

## 2015-05-05 DIAGNOSIS — E119 Type 2 diabetes mellitus without complications: Secondary | ICD-10-CM

## 2015-05-05 DIAGNOSIS — M00261 Other streptococcal arthritis, right knee: Secondary | ICD-10-CM

## 2015-05-05 DIAGNOSIS — I5032 Chronic diastolic (congestive) heart failure: Secondary | ICD-10-CM | POA: Diagnosis not present

## 2015-05-05 DIAGNOSIS — B955 Unspecified streptococcus as the cause of diseases classified elsewhere: Secondary | ICD-10-CM | POA: Diagnosis present

## 2015-05-05 DIAGNOSIS — M009 Pyogenic arthritis, unspecified: Principal | ICD-10-CM | POA: Diagnosis present

## 2015-05-05 DIAGNOSIS — M00211 Other streptococcal arthritis, right shoulder: Secondary | ICD-10-CM | POA: Diagnosis not present

## 2015-05-05 DIAGNOSIS — M79604 Pain in right leg: Secondary | ICD-10-CM | POA: Diagnosis present

## 2015-05-05 DIAGNOSIS — I5033 Acute on chronic diastolic (congestive) heart failure: Secondary | ICD-10-CM | POA: Diagnosis present

## 2015-05-05 DIAGNOSIS — A400 Sepsis due to streptococcus, group A: Secondary | ICD-10-CM | POA: Diagnosis present

## 2015-05-05 DIAGNOSIS — M1711 Unilateral primary osteoarthritis, right knee: Secondary | ICD-10-CM | POA: Diagnosis present

## 2015-05-05 HISTORY — DX: Type 2 diabetes mellitus without complications: E11.9

## 2015-05-05 HISTORY — DX: Personal history of other medical treatment: Z92.89

## 2015-05-05 HISTORY — PX: KNEE ARTHROSCOPY W/ SYNOVECTOMY: SHX1887

## 2015-05-05 HISTORY — PX: SYNOVECTOMY: SHX5180

## 2015-05-05 LAB — CBC
HEMATOCRIT: 24.5 % — AB (ref 36.0–46.0)
Hemoglobin: 7.9 g/dL — ABNORMAL LOW (ref 12.0–15.0)
MCH: 26 pg (ref 26.0–34.0)
MCHC: 32.2 g/dL (ref 30.0–36.0)
MCV: 80.6 fL (ref 78.0–100.0)
PLATELETS: 414 10*3/uL — AB (ref 150–400)
RBC: 3.04 MIL/uL — ABNORMAL LOW (ref 3.87–5.11)
RDW: 14.6 % (ref 11.5–15.5)
WBC: 7.7 10*3/uL (ref 4.0–10.5)

## 2015-05-05 LAB — GLUCOSE, CAPILLARY
GLUCOSE-CAPILLARY: 242 mg/dL — AB (ref 65–99)
GLUCOSE-CAPILLARY: 227 mg/dL — AB (ref 65–99)
Glucose-Capillary: 228 mg/dL — ABNORMAL HIGH (ref 65–99)

## 2015-05-05 LAB — BASIC METABOLIC PANEL
ANION GAP: 14 (ref 5–15)
BUN: 13 mg/dL (ref 6–20)
CALCIUM: 9.4 mg/dL (ref 8.9–10.3)
CO2: 21 mmol/L — AB (ref 22–32)
CREATININE: 0.91 mg/dL (ref 0.44–1.00)
Chloride: 98 mmol/L — ABNORMAL LOW (ref 101–111)
Glucose, Bld: 265 mg/dL — ABNORMAL HIGH (ref 65–99)
Potassium: 4.2 mmol/L (ref 3.5–5.1)
SODIUM: 133 mmol/L — AB (ref 135–145)

## 2015-05-05 SURGERY — SYNOVECTOMY
Anesthesia: General | Site: Knee | Laterality: Right

## 2015-05-05 MED ORDER — SUCCINYLCHOLINE CHLORIDE 20 MG/ML IJ SOLN
INTRAMUSCULAR | Status: DC | PRN
  Administered 2015-05-05: 100 mg via INTRAVENOUS

## 2015-05-05 MED ORDER — FERROUS SULFATE 325 (65 FE) MG PO TABS
325.0000 mg | ORAL_TABLET | Freq: Two times a day (BID) | ORAL | Status: DC
  Administered 2015-05-05 – 2015-05-07 (×5): 325 mg via ORAL
  Filled 2015-05-05 (×5): qty 1

## 2015-05-05 MED ORDER — HYDROCHLOROTHIAZIDE 25 MG PO TABS
25.0000 mg | ORAL_TABLET | Freq: Every day | ORAL | Status: DC
  Filled 2015-05-05: qty 1

## 2015-05-05 MED ORDER — LABETALOL HCL 200 MG PO TABS
200.0000 mg | ORAL_TABLET | Freq: Three times a day (TID) | ORAL | Status: DC
  Administered 2015-05-05 – 2015-05-07 (×6): 200 mg via ORAL
  Filled 2015-05-05 (×9): qty 1

## 2015-05-05 MED ORDER — MIDAZOLAM HCL 2 MG/2ML IJ SOLN
INTRAMUSCULAR | Status: DC | PRN
  Administered 2015-05-05: 2 mg via INTRAVENOUS

## 2015-05-05 MED ORDER — HYDROMORPHONE HCL 1 MG/ML IJ SOLN: 1.0000 mg | INTRAMUSCULAR | Status: DC | PRN

## 2015-05-05 MED ORDER — LIDOCAINE HCL (CARDIAC) 20 MG/ML IV SOLN
INTRAVENOUS | Status: AC
Start: 2015-05-05 — End: 2015-05-05
  Filled 2015-05-05: qty 5

## 2015-05-05 MED ORDER — LACTATED RINGERS IV SOLN
INTRAVENOUS | Status: DC | PRN
  Administered 2015-05-05: 13:00:00 via INTRAVENOUS

## 2015-05-05 MED ORDER — CEFAZOLIN SODIUM-DEXTROSE 2-3 GM-% IV SOLR
INTRAVENOUS | Status: AC
  Administered 2015-05-05: 2 g via INTRAVENOUS
  Filled 2015-05-05: qty 50

## 2015-05-05 MED ORDER — ACETAMINOPHEN 325 MG PO TABS
650.0000 mg | ORAL_TABLET | Freq: Four times a day (QID) | ORAL | Status: DC | PRN
  Administered 2015-05-06 – 2015-05-07 (×2): 650 mg via ORAL
  Filled 2015-05-05 (×2): qty 2

## 2015-05-05 MED ORDER — DOCUSATE SODIUM 100 MG PO CAPS
100.0000 mg | ORAL_CAPSULE | Freq: Two times a day (BID) | ORAL | Status: DC
  Administered 2015-05-05 – 2015-05-07 (×4): 100 mg via ORAL
  Filled 2015-05-05 (×4): qty 1

## 2015-05-05 MED ORDER — INFLUENZA VAC SPLIT QUAD 0.5 ML IM SUSY
0.5000 mL | PREFILLED_SYRINGE | INTRAMUSCULAR | Status: AC
  Administered 2015-05-06: 0.5 mL via INTRAMUSCULAR
  Filled 2015-05-05: qty 0.5

## 2015-05-05 MED ORDER — ENOXAPARIN SODIUM 40 MG/0.4ML ~~LOC~~ SOLN
40.0000 mg | SUBCUTANEOUS | Status: DC
  Administered 2015-05-06 – 2015-05-07 (×2): 40 mg via SUBCUTANEOUS
  Filled 2015-05-05 (×2): qty 0.4

## 2015-05-05 MED ORDER — SODIUM CHLORIDE 0.9 % IR SOLN
Status: DC | PRN
  Administered 2015-05-05 (×3): 3000 mL

## 2015-05-05 MED ORDER — INSULIN ASPART 100 UNIT/ML ~~LOC~~ SOLN
0.0000 [IU] | Freq: Three times a day (TID) | SUBCUTANEOUS | Status: DC
  Administered 2015-05-05: 3 [IU] via SUBCUTANEOUS
  Administered 2015-05-06: 2 [IU] via SUBCUTANEOUS

## 2015-05-05 MED ORDER — CEFAZOLIN SODIUM 1-5 GM-% IV SOLN: 1.0000 g | Freq: Four times a day (QID) | INTRAVENOUS | Status: DC

## 2015-05-05 MED ORDER — POLYETHYLENE GLYCOL 3350 17 G PO PACK: 17.0000 g | PACK | Freq: Every day | ORAL | Status: DC | PRN

## 2015-05-05 MED ORDER — FENTANYL CITRATE (PF) 100 MCG/2ML IJ SOLN
INTRAMUSCULAR | Status: DC | PRN
  Administered 2015-05-05 (×2): 50 ug via INTRAVENOUS
  Administered 2015-05-05: 100 ug via INTRAVENOUS
  Administered 2015-05-05: 50 ug via INTRAVENOUS

## 2015-05-05 MED ORDER — METFORMIN HCL 500 MG PO TABS
500.0000 mg | ORAL_TABLET | Freq: Two times a day (BID) | ORAL | Status: DC
  Administered 2015-05-05: 500 mg via ORAL
  Filled 2015-05-05: qty 1

## 2015-05-05 MED ORDER — ROCURONIUM BROMIDE 50 MG/5ML IV SOLN
INTRAVENOUS | Status: AC
  Filled 2015-05-05: qty 1

## 2015-05-05 MED ORDER — CHLORHEXIDINE GLUCONATE 4 % EX LIQD: 60.0000 mL | Freq: Once | CUTANEOUS | Status: DC

## 2015-05-05 MED ORDER — FENTANYL CITRATE (PF) 100 MCG/2ML IJ SOLN
25.0000 ug | INTRAMUSCULAR | Status: DC | PRN
  Administered 2015-05-05 (×2): 50 ug via INTRAVENOUS

## 2015-05-05 MED ORDER — ACETAMINOPHEN 500 MG PO TABS
1000.0000 mg | ORAL_TABLET | Freq: Once | ORAL | Status: AC
  Administered 2015-05-05: 975 mg via ORAL

## 2015-05-05 MED ORDER — FENTANYL CITRATE (PF) 100 MCG/2ML IJ SOLN
INTRAMUSCULAR | Status: AC
  Administered 2015-05-05: 50 ug via INTRAVENOUS
  Filled 2015-05-05: qty 2

## 2015-05-05 MED ORDER — POTASSIUM CHLORIDE IN NACL 20-0.45 MEQ/L-% IV SOLN
INTRAVENOUS | Status: DC
  Filled 2015-05-05: qty 1000

## 2015-05-05 MED ORDER — LACTATED RINGERS IV SOLN
INTRAVENOUS | Status: DC
  Administered 2015-05-05: 13:00:00 via INTRAVENOUS

## 2015-05-05 MED ORDER — BISACODYL 10 MG RE SUPP: 10.0000 mg | Freq: Every day | RECTAL | Status: DC | PRN

## 2015-05-05 MED ORDER — MIDAZOLAM HCL 2 MG/2ML IJ SOLN
INTRAMUSCULAR | Status: AC
  Filled 2015-05-05: qty 2

## 2015-05-05 MED ORDER — ONDANSETRON HCL 4 MG/2ML IJ SOLN
INTRAMUSCULAR | Status: DC | PRN
Start: 2015-05-05 — End: 2015-05-05
  Administered 2015-05-05: 4 mg via INTRAVENOUS

## 2015-05-05 MED ORDER — SODIUM CHLORIDE 0.45 % IV SOLN: INTRAVENOUS | Status: DC

## 2015-05-05 MED ORDER — ACETAMINOPHEN 325 MG PO TABS
ORAL_TABLET | ORAL | Status: AC
  Administered 2015-05-05: 975 mg via ORAL
  Filled 2015-05-05: qty 3

## 2015-05-05 MED ORDER — FENTANYL CITRATE (PF) 250 MCG/5ML IJ SOLN
INTRAMUSCULAR | Status: AC
  Filled 2015-05-05: qty 5

## 2015-05-05 MED ORDER — POTASSIUM CHLORIDE ER 10 MEQ PO TBCR: 10.0000 meq | EXTENDED_RELEASE_TABLET | Freq: Every day | ORAL | Status: DC

## 2015-05-05 MED ORDER — SODIUM CHLORIDE 0.9 % IV SOLN
INTRAVENOUS | Status: DC
  Administered 2015-05-05: 16:00:00 via INTRAVENOUS

## 2015-05-05 MED ORDER — ADULT MULTIVITAMIN W/MINERALS CH
1.0000 | ORAL_TABLET | Freq: Every day | ORAL | Status: DC
  Administered 2015-05-06 – 2015-05-07 (×2): 1 via ORAL
  Filled 2015-05-05 (×3): qty 1

## 2015-05-05 MED ORDER — LISINOPRIL 20 MG PO TABS
20.0000 mg | ORAL_TABLET | Freq: Every day | ORAL | Status: DC
  Administered 2015-05-06 – 2015-05-07 (×2): 20 mg via ORAL
  Filled 2015-05-05 (×3): qty 1

## 2015-05-05 MED ORDER — FLEET ENEMA 7-19 GM/118ML RE ENEM: 1.0000 | ENEMA | Freq: Once | RECTAL | Status: DC | PRN

## 2015-05-05 MED ORDER — PROPOFOL 10 MG/ML IV BOLUS
INTRAVENOUS | Status: DC | PRN
  Administered 2015-05-05: 30 mg via INTRAVENOUS
  Administered 2015-05-05: 140 mg via INTRAVENOUS
  Administered 2015-05-05: 50 mg via INTRAVENOUS

## 2015-05-05 MED ORDER — ACETAMINOPHEN 650 MG RE SUPP: 650.0000 mg | Freq: Four times a day (QID) | RECTAL | Status: DC | PRN

## 2015-05-05 MED ORDER — OXYCODONE HCL 5 MG PO TABS
ORAL_TABLET | ORAL | Status: AC
Start: 2015-05-05 — End: 2015-05-06
  Filled 2015-05-05: qty 2

## 2015-05-05 MED ORDER — DIPHENHYDRAMINE HCL 12.5 MG/5ML PO ELIX: 12.5000 mg | ORAL_SOLUTION | ORAL | Status: DC | PRN

## 2015-05-05 MED ORDER — SORBITOL 70 % SOLN: 30.0000 mL | Freq: Every day | Status: DC | PRN

## 2015-05-05 MED ORDER — ONDANSETRON HCL 4 MG/2ML IJ SOLN
INTRAMUSCULAR | Status: AC
  Filled 2015-05-05: qty 2

## 2015-05-05 MED ORDER — PROPOFOL 10 MG/ML IV BOLUS
INTRAVENOUS | Status: AC
  Filled 2015-05-05: qty 20

## 2015-05-05 MED ORDER — LOSARTAN POTASSIUM 50 MG PO TABS: 100.0000 mg | ORAL_TABLET | Freq: Every day | ORAL | Status: DC

## 2015-05-05 MED ORDER — LEVOFLOXACIN 500 MG PO TABS
750.0000 mg | ORAL_TABLET | ORAL | Status: DC
  Administered 2015-05-05 – 2015-05-07 (×3): 750 mg via ORAL
  Filled 2015-05-05 (×3): qty 2

## 2015-05-05 MED ORDER — PROMETHAZINE HCL 12.5 MG PO TABS
12.5000 mg | ORAL_TABLET | Freq: Four times a day (QID) | ORAL | Status: DC | PRN
  Filled 2015-05-05: qty 1

## 2015-05-05 MED ORDER — PROMETHAZINE HCL 25 MG/ML IJ SOLN: 6.2500 mg | INTRAMUSCULAR | Status: DC | PRN

## 2015-05-05 MED ORDER — OXYCODONE HCL 5 MG PO TABS
5.0000 mg | ORAL_TABLET | ORAL | Status: DC | PRN
  Administered 2015-05-05 – 2015-05-07 (×6): 10 mg via ORAL
  Filled 2015-05-05 (×5): qty 2

## 2015-05-05 MED ORDER — LIDOCAINE HCL (CARDIAC) 20 MG/ML IV SOLN
INTRAVENOUS | Status: DC | PRN
  Administered 2015-05-05: 100 mg via INTRAVENOUS

## 2015-05-05 MED ORDER — DEXTROSE 5 % IV SOLN
2.0000 g | INTRAVENOUS | Status: DC
  Administered 2015-05-05: 2 g via INTRAVENOUS
  Filled 2015-05-05 (×2): qty 2

## 2015-05-05 MED ORDER — HYDROCHLOROTHIAZIDE 25 MG PO TABS: 25.0000 mg | ORAL_TABLET | Freq: Every day | ORAL | Status: DC

## 2015-05-05 MED ORDER — AMLODIPINE BESYLATE 10 MG PO TABS
10.0000 mg | ORAL_TABLET | Freq: Every day | ORAL | Status: DC
  Administered 2015-05-06 – 2015-05-07 (×2): 10 mg via ORAL
  Filled 2015-05-05 (×2): qty 1

## 2015-05-05 MED ORDER — CEFAZOLIN SODIUM-DEXTROSE 2-3 GM-% IV SOLR
2.0000 g | INTRAVENOUS | Status: DC
  Filled 2015-05-05: qty 50

## 2015-05-05 MED ORDER — ENSURE ENLIVE PO LIQD: 237.0000 mL | Freq: Two times a day (BID) | ORAL | Status: DC

## 2015-05-05 SURGICAL SUPPLY — 49 items
BANDAGE ACE 6X5 VEL STRL LF (GAUZE/BANDAGES/DRESSINGS) ×4 IMPLANT
BANDAGE ELASTIC 6 VELCRO ST LF (GAUZE/BANDAGES/DRESSINGS) IMPLANT
BLADE GREAT WHITE 4.2 (BLADE) ×3 IMPLANT
BLADE GREAT WHITE 4.2MM (BLADE) ×1
BLADE SURG 11 STRL SS (BLADE) IMPLANT
BLADE SURG ROTATE 9660 (MISCELLANEOUS) IMPLANT
BNDG GAUZE ELAST 4 BULKY (GAUZE/BANDAGES/DRESSINGS) ×4 IMPLANT
CONT SPEC 4OZ CLIKSEAL STRL BL (MISCELLANEOUS) ×4 IMPLANT
COVER SURGICAL LIGHT HANDLE (MISCELLANEOUS) ×4 IMPLANT
DRAPE ARTHROSCOPY W/POUCH 114 (DRAPES) ×4 IMPLANT
DRAPE U-SHAPE 47X51 STRL (DRAPES) ×4 IMPLANT
DRSG PAD ABDOMINAL 8X10 ST (GAUZE/BANDAGES/DRESSINGS) IMPLANT
DURAPREP 26ML APPLICATOR (WOUND CARE) ×4 IMPLANT
EVACUATOR 3/16  PVC DRAIN (DRAIN) ×2
EVACUATOR 3/16 PVC DRAIN (DRAIN) ×2 IMPLANT
FILTER STRAW FLUID ASPIR (MISCELLANEOUS) ×4 IMPLANT
GAUZE SPONGE 4X4 12PLY STRL (GAUZE/BANDAGES/DRESSINGS) IMPLANT
GAUZE XEROFORM 1X8 LF (GAUZE/BANDAGES/DRESSINGS) ×4 IMPLANT
GLOVE BIOGEL PI IND STRL 8 (GLOVE) ×4 IMPLANT
GLOVE BIOGEL PI INDICATOR 8 (GLOVE) ×4
GLOVE ORTHO TXT STRL SZ7.5 (GLOVE) ×8 IMPLANT
GLOVE SURG ORTHO 8.0 STRL STRW (GLOVE) ×8 IMPLANT
GOWN STRL REUS W/ TWL LRG LVL3 (GOWN DISPOSABLE) ×4 IMPLANT
GOWN STRL REUS W/ TWL XL LVL3 (GOWN DISPOSABLE) ×2 IMPLANT
GOWN STRL REUS W/TWL LRG LVL3 (GOWN DISPOSABLE) ×4
GOWN STRL REUS W/TWL XL LVL3 (GOWN DISPOSABLE) ×2
IMMOBILIZER KNEE 22 (SOFTGOODS) ×4 IMPLANT
KIT ROOM TURNOVER OR (KITS) ×4 IMPLANT
MANIFOLD NEPTUNE II (INSTRUMENTS) IMPLANT
NEEDLE 18GX1X1/2 (RX/OR ONLY) (NEEDLE) IMPLANT
NEEDLE HYPO 25GX1X1/2 BEV (NEEDLE) IMPLANT
NEEDLE SPNL 18GX3.5 QUINCKE PK (NEEDLE) IMPLANT
NS IRRIG 1000ML POUR BTL (IV SOLUTION) IMPLANT
PACK ARTHROSCOPY DSU (CUSTOM PROCEDURE TRAY) ×4 IMPLANT
PAD ARMBOARD 7.5X6 YLW CONV (MISCELLANEOUS) ×8 IMPLANT
SET ARTHROSCOPY TUBING (MISCELLANEOUS) ×2
SET ARTHROSCOPY TUBING LN (MISCELLANEOUS) ×2 IMPLANT
SPONGE GAUZE 4X4 12PLY STER LF (GAUZE/BANDAGES/DRESSINGS) ×4 IMPLANT
SPONGE LAP 4X18 X RAY DECT (DISPOSABLE) ×4 IMPLANT
SUT ETHILON 2 0 FS 18 (SUTURE) ×4 IMPLANT
SUT ETHILON 4 0 PS 2 18 (SUTURE) ×4 IMPLANT
SYR 20ML ECCENTRIC (SYRINGE) IMPLANT
SYR 3ML 25GX5/8 SAFETY (SYRINGE) ×4 IMPLANT
SYR CONTROL 10ML LL (SYRINGE) IMPLANT
TOWEL OR 17X24 6PK STRL BLUE (TOWEL DISPOSABLE) ×4 IMPLANT
TOWEL OR 17X26 10 PK STRL BLUE (TOWEL DISPOSABLE) ×4 IMPLANT
TUBE CONNECTING 12'X1/4 (SUCTIONS) ×1
TUBE CONNECTING 12X1/4 (SUCTIONS) ×3 IMPLANT
WATER STERILE IRR 1000ML POUR (IV SOLUTION) ×4 IMPLANT

## 2015-05-05 NOTE — Consult Note (Signed)
[facility] Medical Consultation  [name] [id number] DOB: [birth year] DOA: 05/05/2015 PCP: Candice Hams, MD   Requesting physician: Orthopedics Date of consultation: 05/05/2015 Reason for consultation: Manangement of primary care issues including DM and HTN  Impression/Recommendations  Right knee effusion Admitted for I+D by Ortho.  PLans as per admitting team  Diabetes mellitus type 2 insulin dependent, last Hb A1C 7.0 on 03/27/15 Current sugar levels 242 - Hold oral medications - SSI  Hypertension Well controlled. BP 146/78 mmHg  Pulse 108  Temp(Src) 99 F (37.2 C) (Oral)  Resp 18  Wt 71.215 kg (157 lb)  SpO2 100% Continue home anti-hypertensive medications.  Add Hydralazine Q6 hours as needed for SBP >160 and /or DBP >110.   CHF, diastolic recent diagnosis. 2 D Echo 03/29/15, mild LVH Normal systolic function, EF 123456 to 123456, grade 1 diastolic dysfunction Strict I+O Daily weights  02 if sats less than 90- 92% if underlying COPD  Continue home Blood Pressure medications including Beta Blockers, ACE Inhibitors (unless AKI or elevated K+), diuretics (unless hypotensive with SBP <100 or HR less than 60).   Continue Norvasc Avoid NSAIDS  Anemia of chronic disease, in the setting of Iron deficiency and recent infection, antibiotics recent large hematoma at right shoulder area of debridement and external hemorrhoids. BL 8-9. Present colonoscopy and EGD was normal. Most recent stool is heme was negative.  Last Iron studies in 03/2015 with Ferritin at 628, Iron 19, TIBC 146  Continue oral iron Will continue f/u as outpatient  Hyponatremia, due to dilution, meds. Patient asymptomatic current Na 133 Consider holding HCTZ Repeat BMET in am  Will follow up.  Please contact Thank you for this consultation.  HPI:  50 y/o AAF With a history of hypertension and non-insulin dependent diabetes, diastolic heart failure, with a recent right septic shoulder requiring  debridement on 03/30/15, followed by ID, On Levaquin, now admitted today due to right knee effusion to be debrided by Ortho service. This right knee was aspirated on 05/04/2015, showing a white count of greater than 61,000, without bacterial growth to date. She denies any fever, chills or night sweats. She denies any nausea or vomiting. She denies any abdominal pain, or diarrhea. Other than her right knee effusion, she denies any other areas of swelling in his extremities. She denies any new rashes.  Currently her VS show a BP 146/78, low grade fever at 99 in the setting of knee efussion. CBC remarkable for Hb of 7.9 (BL 8.0) and CMET on 2/3 normal   DVT prophylaxis, currently on SCDs prior to surgery.   Review of Systems:   General:  Denies fevers, chills, weight loss or gain HEENT:  Denies changes to hearing and vision, rhinorrhea, sinus congestion, sore throat CV:  Denies chest pain and palpitations, denies lower extremity edema except for right knee effusion as above.  PULM:  Denies SOB, wheezing, cough.   GI:  Denies nausea, vomiting, constipation, diarrhea.   GU:  Denies dysuria, frequency, urgency ENDO:  Denies polyuria, polydipsia.   HEME:  Denies hematemesis, blood in stools, melena, abnormal bruising or bleeding.  LYMPH:  Denies lymphadenopathy.   MSK:  Denies arthralgias, myalgias.   DERM:  Denies skin rash or ulcer.   NEURO:  Denies focal numbness, weakness, slurred speech, confusion, facial droop.  PSYCH:  Denies anxiety and depression.    Past Medical History  Diagnosis Date  . Hypertension   . Diabetes mellitus without complication ([place])   . Anemia 03/2015  SEVERE   . Shortness of breath dyspnea    Past Surgical History  Procedure Laterality Date  . Breast surgery    . Uterine fibroid surgery    . Left oophorectomy Left   . I&d extremity Right 03/30/2015    Procedure: IRRIGATION AND DEBRIDEMENT EXTREMITY;  Surgeon: Renette Butters, MD;  Location: [place];  Service:  Orthopedics;  Laterality: Right;  . Shoulder arthroscopy Right 03/30/2015    Procedure: ARTHROSCOPY SHOULDER;  Surgeon: Renette Butters, MD;  Location: [place];  Service: Orthopedics;  Laterality: Right;  . Colonoscopy with propofol N/A 04/14/2015    Procedure: COLONOSCOPY WITH PROPOFOL;  Surgeon: Clarene Essex, MD;  Location: [facility] ENDOSCOPY;  Service: Endoscopy;  Laterality: N/A;  . Esophagogastroduodenoscopy N/A 04/16/2015    Procedure: ESOPHAGOGASTRODUODENOSCOPY (EGD);  Surgeon: Clarene Essex, MD;  Location[facility] ENDOSCOPY;  Service: Endoscopy;  Laterality: N/A;   Social History:  reports that she has never smoked. She has never used smokeless tobacco. She reports that she does not drink alcohol or use illicit drugs.  No Known Allergies History reviewed. No pertinent family history.  Prior to Admission medications   Medication Sig Start Date End Date Taking? Authorizing Provider  acetaminophen (TYLENOL) 325 MG tablet Take 325 mg by mouth every 6 (six) hours as needed for mild pain or moderate pain.   Yes Historical Provider, MD  amLODipine (NORVASC) 10 MG tablet Take 1 tablet (10 mg total) by mouth daily. 04/07/15  Yes [name], MD  feeding supplement, GLUCERNA SHAKE, (GLUCERNA SHAKE) LIQD Take 237 mLs by mouth 3 (three) times daily between meals. 04/07/15  Yes [name], MD  ferrous sulfate 325 (65 FE) MG tablet Take 1 tablet (325 mg total) by mouth 2 (two) times daily with a meal. 04/07/15  Yes [name], MD  hydrochlorothiazide (HYDRODIURIL) 25 MG tablet Take 25 mg by mouth daily. 03/08/15  Yes Historical Provider, MD  labetalol (NORMODYNE) 200 MG tablet Take 1 tablet (200 mg total) by mouth 3 (three) times daily. 04/07/15  Yes [name], MD  levofloxacin (LEVAQUIN) 750 MG tablet Take 1 tablet (750 mg total) by mouth daily. 04/20/15  Yes Campbell Riches, MD  lisinopril (PRINIVIL,ZESTRIL) 20 MG tablet Take 1 tablet (20 mg total) by mouth daily. 04/17/15  Yes Delfina Redwood, MD   losartan (COZAAR) 100 MG tablet Take 100 mg by mouth daily.  03/08/15  Yes Historical Provider, MD  metFORMIN (GLUCOPHAGE) 500 MG tablet Take 500 mg by mouth 2 (two) times daily. 03/08/15  Yes Historical Provider, MD  Multiple Vitamin (MULTIVITAMIN) tablet Take 1 tablet by mouth daily.   Yes Historical Provider, MD  ondansetron (ZOFRAN) 4 MG tablet Take 1 tablet (4 mg total) by mouth every 8 (eight) hours as needed for nausea or vomiting. 04/20/15  Yes Campbell Riches, MD  oxyCODONE-acetaminophen (PERCOCET) 5-325 MG tablet Take 0.5-1 tablets by mouth every 4 (four) hours as needed. Patient taking differently: Take 0.5-1 tablets by mouth every 4 (four) hours as needed for moderate pain.  04/09/15  Yes Orpah Greek, MD   Physical Exam: Blood pressure 146/78, pulse 108, temperature 99 F (37.2 C), temperature source Oral, resp. rate 18, weight 71.215 kg (157 lb), SpO2 100 %. Filed Vitals:   05/05/15 1245 05/05/15 1248 05/05/15 1259  BP:  146/78   Pulse:  108   Temp:  99 F (37.2 C)   TempSrc:  Oral   Resp:  18   Weight: 72.122 kg (159 lb)  71.215 kg (157 lb)  SpO2:  100%      General:  NAD, alert and oriented, confersant  Eyes:  PERRL, anicteric, non-injected.  ENT:  Nares clear.  OP clear, non-erythematous without plaques or exudates.  MMM.  Neck:  Supple without TM or JVD.    Lymph:  No cervical, supraclavicular, or submandibular LAD.  Cardiovascular:  RRR, normal S1, S2, without m/r/g.  2+ pulses, warm extremities  Respiratory:  CTA bilaterally without increased WOB.  Abdomen:  NABS.  Soft, ND/NT.    Skin:  No rashes or focal open lesions. Right knee effusion  Musculoskeletal:  Normal bulk and tone.  No LE edema.  Psychiatric:  A & O x 4.  Appropriate affect.  Neurologic:  CN 3-12 intact.  5/5 strength.  Sensation intact.  Labs on Admission:  Basic Metabolic Panel:  Recent Labs Lab 05/05/15 1246  NA 133*  K 4.2  CL 98*  CO2 21*  GLUCOSE 265*  BUN 13   CREATININE 0.91  CALCIUM 9.4   Liver Function Tests: No results for input(s): AST, ALT, ALKPHOS, BILITOT, PROT, ALBUMIN in the last 168 hours. No results for input(s): LIPASE, AMYLASE in the last 168 hours. No results for input(s): AMMONIA in the last 168 hours. CBC:  Recent Labs Lab 05/05/15 1246  WBC 7.7  HGB 7.9*  HCT 24.5*  MCV 80.6  PLT 414*   Cardiac Enzymes: No results for input(s): CKTOTAL, CKMB, CKMBINDEX, TROPONINI in the last 168 hours. BNP: Invalid input(s): POCBNP CBG:  Recent Labs Lab 05/05/15 1251  GLUCAP 242*    Radiological Exams on Admission: No results found.   2 D Echo 03/29/15  Left ventricle: The cavity size was normal. Wall thickness wasincreased in a pattern of mild LVH. Systolic function was normal.The estimated ejection fraction was in the range of 60% to 65%.Wall motion was normal; there were no regional wall motion abnormalities. Doppler parameters are consistent with abnormal left ventricular relaxation (grade 1 diastolic dysfunction).   EKG: Independently reviewed.   Time spent: 75 min  [facility] Pager [phone]  If 7PM-7AM, please contact night-coverage [email or web address] Password [email or web address] 05/05/2015, 1:28 PM

## 2015-05-05 NOTE — Anesthesia Preprocedure Evaluation (Addendum)
Anesthesia Evaluation  Patient identified by MRN, date of birth, ID band Patient awake    Reviewed: Allergy & Precautions, NPO status , Patient's Chart, lab work & pertinent test results  History of Anesthesia Complications Negative for: history of anesthetic complications  Airway Mallampati: II  TM Distance: >3 FB Neck ROM: Full    Dental no notable dental hx. (+) Dental Advisory Given   Pulmonary neg pulmonary ROS,    Pulmonary exam normal breath sounds clear to auscultation       Cardiovascular hypertension, Pt. on medications +CHF  Normal cardiovascular exam Rhythm:Regular Rate:Normal     Neuro/Psych PSYCHIATRIC DISORDERS negative neurological ROS     GI/Hepatic negative GI ROS, Neg liver ROS,   Endo/Other  diabetes  Renal/GU negative Renal ROS  negative genitourinary   Musculoskeletal  (+) Arthritis ,   Abdominal   Peds negative pediatric ROS (+)  Hematology negative hematology ROS (+)   Anesthesia Other Findings   Reproductive/Obstetrics negative OB ROS                            Anesthesia Physical Anesthesia Plan  ASA: III  Anesthesia Plan: General   Post-op Pain Management:    Induction: Intravenous  Airway Management Planned: LMA  Additional Equipment:   Intra-op Plan:   Post-operative Plan: Extubation in OR  Informed Consent: I have reviewed the patients History and Physical, chart, labs and discussed the procedure including the risks, benefits and alternatives for the proposed anesthesia with the patient or authorized representative who has indicated his/her understanding and acceptance.   Dental advisory given  Plan Discussed with: CRNA  Anesthesia Plan Comments:         Anesthesia Quick Evaluation

## 2015-05-05 NOTE — Anesthesia Postprocedure Evaluation (Signed)
Anesthesia Post Note  Patient: [name]  Procedure(s) Performed: Procedure(s) (LRB): ARTHROSCOPIC SYNOVECTOMY WITH LAVAGE (Right)  Patient location during evaluation: PACU Anesthesia Type: General Level of consciousness: awake and alert and oriented Pain management: pain level controlled Vital Signs Assessment: post-procedure vital signs reviewed and stable Respiratory status: spontaneous breathing, nonlabored ventilation and respiratory function stable Cardiovascular status: blood pressure returned to baseline and stable Postop Assessment: no signs of nausea or vomiting Anesthetic complications: no    Last Vitals:  Filed Vitals:   05/05/15 1515 05/05/15 1530  BP: 167/86 164/86  Pulse: 105 105  Temp:  37.1 C  Resp: 12 10    Last Pain:  Filed Vitals:   05/05/15 1542  PainSc: 7                  Christien Frankl A.

## 2015-05-05 NOTE — Brief Op Note (Signed)
05/05/2015  2:16 PM  PATIENT:  [name]  50 y.o. female  PRE-OPERATIVE DIAGNOSIS:  Septic Right Knee  POST-OPERATIVE DIAGNOSIS:  Septic Right Knee  PROCEDURE:  Procedure(s): ARTHROSCOPIC SYNOVECTOMY WITH LAVAGE (Right)  SURGEON:  Surgeon(s) and Role:    * Earlie Server, MD - Primary  PHYSICIAN ASSISTANT: Chriss Czar, PA-C  ASSISTANTS:    ANESTHESIA:   general  EBL:  Total I/O In: 600 [I.V.:600] Out: 47 [Blood:50]  BLOOD ADMINISTERED:none  DRAINS: 2 hemovac drains, medial and lateral right knee self suction   LOCAL MEDICATIONS USED:  NONE  SPECIMEN:  Knee aspirate  DISPOSITION OF SPECIMEN:  micro  COUNTS:  YES  TOURNIQUET:  * No tourniquets in log *  DICTATION: .Other Dictation: Dictation Number unknown  PLAN OF CARE: Admit to inpatient   PATIENT DISPOSITION:  PACU - hemodynamically stable.   Delay start of Pharmacological VTE agent (>24hrs) due to surgical blood loss or risk of bleeding: yes

## 2015-05-05 NOTE — Anesthesia Procedure Notes (Signed)
Procedure Name: Intubation Date/Time: 05/05/2015 1:16 PM Performed by: Garrison Columbus T Pre-anesthesia Checklist: Patient identified, Emergency Drugs available, Suction available and Patient being monitored Patient Re-evaluated:Patient Re-evaluated prior to inductionOxygen Delivery Method: Circle system utilized Preoxygenation: Pre-oxygenation with 100% oxygen Intubation Type: IV induction Ventilation: Mask ventilation without difficulty Laryngoscope Size: Mac and 3 Grade View: Grade I Tube type: Oral Tube size: 7.0 mm Number of attempts: 1 Airway Equipment and Method: Stylet Placement Confirmation: ETT inserted through vocal cords under direct vision,  positive ETCO2 and breath sounds checked- equal and bilateral Secured at: 22 cm Tube secured with: Tape Dental Injury: Teeth and Oropharynx as per pre-operative assessment

## 2015-05-05 NOTE — H&P (Signed)
ORTHOPAEDIC CONSULTATION  REQUESTING PHYSICIAN: Renette Butters, MD  Chief Complaint: Right septic knee  HPI: [name] is a 50 y.o. female who complains of increasing R knee pain and swelling over the past few days.  Patient recently underwent US guided aspiration of large bakers cyst that was negative for bacterial growth.  Right knee was aspirated yesterday in the clinic.  Cell count revealed WBC of >61,000. No bacterial growth day one but on abx currently.  Denies fevers/chills. Currently on abx per ID following R septic shoulder earlier this month.   Past Medical History  Diagnosis Date  . Hypertension   . Diabetes mellitus without complication ([place])   . Anemia 03/2015    SEVERE   . Shortness of breath dyspnea    Past Surgical History  Procedure Laterality Date  . Breast surgery    . Uterine fibroid surgery    . Left oophorectomy Left   . I&d extremity Right 03/30/2015    Procedure: IRRIGATION AND DEBRIDEMENT EXTREMITY;  Surgeon: Renette Butters, MD;  Location: [place];  Service: Orthopedics;  Laterality: Right;  . Shoulder arthroscopy Right 03/30/2015    Procedure: ARTHROSCOPY SHOULDER;  Surgeon: Renette Butters, MD;  Location: [place];  Service: Orthopedics;  Laterality: Right;  . Colonoscopy with propofol N/A 04/14/2015    Procedure: COLONOSCOPY WITH PROPOFOL;  Surgeon: Clarene Essex, MD;  Location: [facility] ENDOSCOPY;  Service: Endoscopy;  Laterality: N/A;  . Esophagogastroduodenoscopy N/A 04/16/2015    Procedure: ESOPHAGOGASTRODUODENOSCOPY (EGD);  Surgeon: Clarene Essex, MD;  Location[facility] ENDOSCOPY;  Service: Endoscopy;  Laterality: N/A;   Social History   Social History  . Marital Status: Married    Spouse Name: N/A  . Number of Children: N/A  . Years of Education: N/A   Social History Main Topics  . Smoking status: Never Smoker   . Smokeless tobacco: Never Used  . Alcohol Use: No  . Drug Use: No  . Sexual Activity: Not on file   Other Topics Concern  . Not on  file   Social History Narrative   No family history on file. No Known Allergies Prior to Admission medications   Medication Sig Start Date End Date Taking? Authorizing Provider  acetaminophen (TYLENOL) 325 MG tablet Take 325 mg by mouth every 6 (six) hours as needed for mild pain or moderate pain.    Historical Provider, MD  amLODipine (NORVASC) 10 MG tablet Take 1 tablet (10 mg total) by mouth daily. 04/07/15   [name], MD  feeding supplement, GLUCERNA SHAKE, (GLUCERNA SHAKE) LIQD Take 237 mLs by mouth 3 (three) times daily between meals. 04/07/15   [name], MD  ferrous sulfate 325 (65 FE) MG tablet Take 1 tablet (325 mg total) by mouth 2 (two) times daily with a meal. 04/07/15   [name], MD  labetalol (NORMODYNE) 200 MG tablet Take 1 tablet (200 mg total) by mouth 3 (three) times daily. 04/07/15   [name], MD  levofloxacin (LEVAQUIN) 750 MG tablet Take 1 tablet (750 mg total) by mouth daily. 04/20/15   Campbell Riches, MD  lisinopril (PRINIVIL,ZESTRIL) 20 MG tablet Take 1 tablet (20 mg total) by mouth daily. 04/17/15   Delfina Redwood, MD  metFORMIN (GLUCOPHAGE) 500 MG tablet Take 500 mg by mouth 2 (two) times daily. 03/08/15   Historical Provider, MD  ondansetron (ZOFRAN) 4 MG tablet Take 1 tablet (4 mg total) by mouth every 8 (eight) hours as needed for nausea or vomiting.  04/20/15   Campbell Riches, MD  oxyCODONE-acetaminophen (PERCOCET) 5-325 MG tablet Take 0.5-1 tablets by mouth every 4 (four) hours as needed. 04/09/15   Orpah Greek, MD  potassium chloride (K-DUR) 10 MEQ tablet Take 1 tablet (10 mEq total) by mouth daily. 04/17/15   Delfina Redwood, MD   No results found.  Positive ROS: All other systems have been reviewed and were otherwise negative with the exception of those mentioned in the HPI and as above.  Labs cbc No results for input(s): WBC, HGB, HCT, PLT in the last 72 hours.  Labs inflam No results for input(s): CRP in the last 72  hours.  Invalid input(s): ESR  Labs coag No results for input(s): INR, PTT in the last 72 hours.  Invalid input(s): PT  No results for input(s): NA, K, CL, CO2, GLUCOSE, BUN, CREATININE, CALCIUM in the last 72 hours.  Physical Exam: There were no vitals filed for this visit. General: Alert, no acute distress Cardiovascular: No pedal edema Respiratory: No cyanosis, no use of accessory musculature GI: No organomegaly, abdomen is soft and non-tender Skin: No lesions in the area of chief complaint other than those listed below in MSK exam.  Neurologic: Sensation intact distally Psychiatric: Patient is competent for consent with normal mood and affect Lymphatic: No axillary or cervical lymphadenopathy  MUSCULOSKELETAL:  Right knee is swollen.  Warm to the touch and erythematous.  Decreased ROM due to pain and swelling.  Sensation intact with 2+ distal pulses.  Other extremities are atraumatic with painless ROM and NVI.  Assessment: Right septic knee  Plan: Plan to take to the OR today for urgent ID.  Will have patient admitted to the hospital.  Consulting Medicine and ID to help with post-op management.  Patient last ate at 6:30 this am.  NPO till surgery.  WBAT in the RLE.    Gae Dry, PA-C Cell [phone]   05/05/2015 11:14 AM

## 2015-05-05 NOTE — Transfer of Care (Signed)
Immediate Anesthesia Transfer of Care Note  Patient: [name]  Procedure(s) Performed: Procedure(s): ARTHROSCOPIC SYNOVECTOMY WITH LAVAGE (Right)  Patient Location: PACU  Anesthesia Type:General  Level of Consciousness: awake, alert  and oriented  Airway & Oxygen Therapy: Patient Spontanous Breathing  Post-op Assessment: Report given to RN, Post -op Vital signs reviewed and stable and Patient moving all extremities X 4  Post vital signs: Reviewed and stable  Last Vitals:  Filed Vitals:   05/05/15 1248  BP: 146/78  Pulse: 108  Temp: 37.2 C  Resp: 18    Complications: No apparent anesthesia complications

## 2015-05-06 ENCOUNTER — Encounter (HOSPITAL_COMMUNITY): Payer: Self-pay | Admitting: Orthopedic Surgery

## 2015-05-06 DIAGNOSIS — M00211 Other streptococcal arthritis, right shoulder: Secondary | ICD-10-CM | POA: Insufficient documentation

## 2015-05-06 DIAGNOSIS — I10 Essential (primary) hypertension: Secondary | ICD-10-CM

## 2015-05-06 DIAGNOSIS — A400 Sepsis due to streptococcus, group A: Secondary | ICD-10-CM | POA: Insufficient documentation

## 2015-05-06 DIAGNOSIS — Z794 Long term (current) use of insulin: Secondary | ICD-10-CM

## 2015-05-06 DIAGNOSIS — E1165 Type 2 diabetes mellitus with hyperglycemia: Secondary | ICD-10-CM

## 2015-05-06 DIAGNOSIS — M009 Pyogenic arthritis, unspecified: Principal | ICD-10-CM

## 2015-05-06 DIAGNOSIS — E0801 Diabetes mellitus due to underlying condition with hyperosmolarity with coma: Secondary | ICD-10-CM

## 2015-05-06 DIAGNOSIS — M00261 Other streptococcal arthritis, right knee: Secondary | ICD-10-CM

## 2015-05-06 LAB — COMPREHENSIVE METABOLIC PANEL
ALBUMIN: 2 g/dL — AB (ref 3.5–5.0)
ALT: 11 U/L — AB (ref 14–54)
AST: 22 U/L (ref 15–41)
Alkaline Phosphatase: 54 U/L (ref 38–126)
Anion gap: 10 (ref 5–15)
BUN: 9 mg/dL (ref 6–20)
CHLORIDE: 101 mmol/L (ref 101–111)
CO2: 26 mmol/L (ref 22–32)
CREATININE: 0.79 mg/dL (ref 0.44–1.00)
Calcium: 8.9 mg/dL (ref 8.9–10.3)
GFR calc Af Amer: >60 mL/min (ref >60)
GLUCOSE: 190 mg/dL — AB (ref 65–99)
POTASSIUM: 4.1 mmol/L (ref 3.5–5.1)
SODIUM: 137 mmol/L (ref 135–145)
Total Bilirubin: 0.1 mg/dL — ABNORMAL LOW (ref 0.3–1.2)
Total Protein: 6.5 g/dL (ref 6.5–8.1)

## 2015-05-06 LAB — GLUCOSE, CAPILLARY
GLUCOSE-CAPILLARY: 252 mg/dL — AB (ref 65–99)
GLUCOSE-CAPILLARY: 190 mg/dL — AB (ref 65–99)
Glucose-Capillary: 180 mg/dL — ABNORMAL HIGH (ref 65–99)
Glucose-Capillary: 164 mg/dL — ABNORMAL HIGH (ref 65–99)

## 2015-05-06 MED ORDER — INSULIN GLARGINE 100 UNIT/ML ~~LOC~~ SOLN
5.0000 [IU] | Freq: Every day | SUBCUTANEOUS | Status: DC
  Administered 2015-05-06 – 2015-05-07 (×2): 5 [IU] via SUBCUTANEOUS
  Filled 2015-05-06 (×2): qty 0.05

## 2015-05-06 MED ORDER — INSULIN ASPART 100 UNIT/ML ~~LOC~~ SOLN: 0.0000 [IU] | Freq: Every day | SUBCUTANEOUS | Status: DC

## 2015-05-06 MED ORDER — INSULIN ASPART 100 UNIT/ML ~~LOC~~ SOLN
0.0000 [IU] | Freq: Three times a day (TID) | SUBCUTANEOUS | Status: DC
  Administered 2015-05-06 – 2015-05-07 (×5): 2 [IU] via SUBCUTANEOUS

## 2015-05-06 MED ORDER — GLUCERNA SHAKE PO LIQD: 237.0000 mL | Freq: Two times a day (BID) | ORAL | Status: DC

## 2015-05-06 NOTE — Progress Notes (Signed)
[facility]  Patient Status: Active patient with [facility] up to this readmission  [facility] is providing the following services: HHRN, OT, PT.  Pt just completed home IV ABX on 04/26/15 with [facility]. [facility] hospital team will follow [name] to support home care needs at DC as ordered.  If patient discharges after hours, please call [phone].   Larry Sierras 05/06/2015, 12:49 PM

## 2015-05-06 NOTE — Progress Notes (Signed)
Utilization review completed. Heaton Sarin, RN, BSN. 

## 2015-05-06 NOTE — Evaluation (Signed)
Physical Therapy Evaluation Patient Details Name: [name] MRN: [id number] DOB: 1966-02-09 Today's Date: 05/06/2015   History of Present Illness  50 y.o. female admitted to [facility] on 05/05/15 for right leg/knee joint pain and swelling.  Pt underwent I&D and synovectomy of her right knee on 05/05/15.  Infectious disease MD also consulted.  Pt with significant PMHx of HTN, anemia, SOB, DM2, I &D and R shoulder arthroscopy 03/30/15.    Clinical Impression  Pt is mobilizing well with RW, supervision overall, min guard on the stairs.  She was given a LE HEP and would benefit from her own RW at home.   PT to follow acutely for deficits listed below.       Follow Up Recommendations Home health PT;Supervision - Intermittent (resume HH therapy services)    Equipment Recommendations  Rolling walker with 5" wheels    Recommendations for Other Services   NA     Precautions / Restrictions Precautions Precautions: Knee Precaution Booklet Issued: Yes (comment) Precaution Comments: knee exercise handout given Required Braces or Orthoses: Knee Immobilizer - Right Restrictions RLE Weight Bearing: Weight bearing as tolerated      Mobility  Bed Mobility Overal bed mobility: Needs Assistance Bed Mobility: Supine to Sit     Supine to sit: Min assist     General bed mobility comments: Min assist to help progress right leg to EOB.   Transfers Overall transfer level: Needs assistance Equipment used: Rolling walker (2 wheeled) Transfers: Sit to/from Stand Sit to Stand: Supervision         General transfer comment: supervision for safety, verbal cues for safe hand placement.   Ambulation/Gait Ambulation/Gait assistance: Supervision Ambulation Distance (Feet): 180 Feet Assistive device: Rolling walker (2 wheeled) Gait Pattern/deviations: Step-through pattern;Antalgic;Trunk flexed Gait velocity: decreased Gait velocity interpretation: Below normal speed for age/gender General Gait  Details: Verbal cues for upright posture and closer proximity to RW.    Stairs Stairs: Yes Stairs assistance: Min guard Stair Management: One rail Left;Forwards;Step to pattern Number of Stairs: 5 General stair comments: Pt needed min guard assist for safety. Verbal cues for correct LE sequencing.       Balance Overall balance assessment: Needs assistance Sitting-balance support: Feet supported;No upper extremity supported Sitting balance-Leahy Scale: Normal     Standing balance support: Bilateral upper extremity supported;No upper extremity supported;Single extremity supported Standing balance-Leahy Scale: Fair                               Pertinent Vitals/Pain Pain Assessment: 0-10 Pain Score: 7  Pain Location: right knee Pain Descriptors / Indicators: Aching;Burning Pain Intervention(s): Limited activity within patient's tolerance;Monitored during session;Repositioned    Home Living Family/patient expects to be discharged to:: Private residence Living Arrangements: Spouse/significant other;Children (two twin girls 67 y.o. ) Available Help at Discharge: Family;Available PRN/intermittently Type of Home: House Home Access: Stairs to enter Entrance Stairs-Rails: Left Entrance Stairs-Number of Steps: 3 Home Layout: Two level;Able to live on main level with bedroom/bathroom Home Equipment: None Additional Comments: Patient states that her husband works but her aunt will be around to provide some assistance but will not be 24 hour.    Prior Function Level of Independence: Independent               Hand Dominance   Dominant Hand: Right    Extremity/Trunk Assessment   Upper Extremity Assessment: RUE deficits/detail RUE Deficits / Details: Pt is still actively receiving therapy  for R shoulder surgery.          Lower Extremity Assessment: RLE deficits/detail RLE Deficits / Details: right leg with normal post op pain and weakness. ankle 3/5 with  posterior knee pain in full DF, knee 2+/5, hip 2+/5       Communication   Communication: No difficulties  Cognition Arousal/Alertness: Awake/alert Behavior During Therapy: WFL for tasks assessed/performed Overall Cognitive Status: Within Functional Limits for tasks assessed                         Exercises Total Joint Exercises Ankle Circles/Pumps: AROM;Both;20 reps Quad Sets: AROM;Right;10 reps Towel Squeeze: AROM;Both;10 reps Short Arc QuadSinclair Ship;Right;10 reps Heel Slides: AAROM;Right;10 reps Hip ABduction/ADduction: AAROM;Right;10 reps Straight Leg Raises: AAROM;Right;10 reps      Assessment/Plan    PT Assessment Patient needs continued PT services  PT Diagnosis Difficulty walking;Abnormality of gait;Generalized weakness;Acute pain   PT Problem List Decreased strength;Decreased range of motion;Decreased activity tolerance;Decreased balance;Decreased mobility;Decreased knowledge of use of DME;Decreased knowledge of precautions;Pain  PT Treatment Interventions DME instruction;Gait training;Stair training;Functional mobility training;Therapeutic activities;Therapeutic exercise;Neuromuscular re-education;Patient/family education;Manual techniques;Modalities   PT Goals (Current goals can be found in the Care Plan section) Acute Rehab PT Goals Patient Stated Goal: pt wants to stop getting surgeries PT Goal Formulation: With patient Time For Goal Achievement: 05/20/15 Potential to Achieve Goals: Good    Frequency Min 3X/week    End of Session Equipment Utilized During Treatment: Right knee immobilizer Activity Tolerance: Patient limited by pain;Patient limited by fatigue Patient left: in chair;with call bell/phone within reach Nurse Communication: Mobility status         Time: [id number] PT Time Calculation (min) (ACUTE ONLY): 33 min   Charges:   PT Evaluation $PT Eval Moderate Complexity: 1 Procedure PT Treatments $Gait Training: 8-22 mins         Ivana Nicastro B. [place], [place], DPT [phone]   05/06/2015, 4:14 PM

## 2015-05-06 NOTE — Progress Notes (Signed)
Initial Nutrition Assessment  DOCUMENTATION CODES:   Not applicable  INTERVENTION:  Discontinue Ensure.  Provide Glucerna Shake po BID, each supplement provides 220 kcal and 10 grams of protein.  Encourage adequate PO intake.  NUTRITION DIAGNOSIS:   Increased nutrient needs related to chronic illness as evidenced by estimated needs.  GOAL:   Patient will meet greater than or equal to 90% of their needs  MONITOR:   PO intake, Supplement acceptance, Weight trends, Labs, I & O's, Skin  REASON FOR ASSESSMENT:   Malnutrition Screening Tool    ASSESSMENT:   50 y.o. female with a Past Medical History of HTN, DM, Anemia, and recent septic joint who presents with R knee septic joint. Pt admitted by ortho for I&D. Consulted to assist w/ managing CHF, HTN, DM, and anemia.   PROCEDURE: (2/22): ARTHROSCOPIC SYNOVECTOMY WITH LAVAGE (Right)  Pt reports appetite is fine currently and PTA with consumption of at least 3 meals a day with no other difficulties. No meal percent recorded, however pt reports 50% intake this AM. Usual body weight reported to be ~158 lbs. Per Epic weight records pt with a 19% weight loss in 1 month, which may be related to fluids. Pt currently has Ensure ordered and has been refusing them. Noted pt has Glucerna at bedside which she reported she brought in from home. RD to discontinue Ensure and order Glucerna instead. Pt was encouraged to eat her food at meals.  Pt with no observed significant fat or muscle mass loss.   Labs and medications reviewed.   Diet Order:  Diet heart healthy/carb modified Room service appropriate?: Yes; Fluid consistency:: Thin  Skin:   (Incision on R leg)  Last BM:  2/22  Height:   Ht Readings from Last 1 Encounters:  04/27/15 5\' 6"  (1.676 m)    Weight:   Wt Readings from Last 1 Encounters:  05/05/15 157 lb (71.215 kg)    Ideal Body Weight:  59 kg  BMI:  Body mass index is 25.35 kg/(m^2).  Estimated Nutritional  Needs:   Kcal:  1850-2050  Protein:  85-95 grams  Fluid:  1.8 - 2 L/day  EDUCATION NEEDS:   No education needs identified at this time  Corrin Parker, MS, RD, LDN Pager # [phone] After hours/ weekend pager # [phone]

## 2015-05-06 NOTE — Progress Notes (Signed)
     Subjective:  POD#1 I/D of the R knee for a septic joint. Patient reports pain as moderate.  ID consult still pending to determine plan for abx given second joint infection.  Ceftriaxone for now.  Cultures pending from the OR yesterday.   Objective:   VITALS:   Filed Vitals:   05/05/15 2007 05/05/15 2009 05/06/15 0041 05/06/15 0500  BP: 155/91 146/80 141/74 138/74  Pulse: 104  99 100  Temp: 98.4 F (36.9 C)  99.1 F (37.3 C) 98.4 F (36.9 C)  TempSrc: Oral     Resp: 16  16 16   Weight:      SpO2: 100%  100% 100%    Neurologically intact ABD soft Neurovascular intact Sensation intact distally Intact pulses distally Dorsiflexion/Plantar flexion intact Incision: dressing C/D/I Drain to the R knee  Lab Results  Component Value Date   WBC 7.7 05/05/2015   HGB 7.9* 05/05/2015   HCT 24.5* 05/05/2015   MCV 80.6 05/05/2015   PLT 414* 05/05/2015   BMET    Component Value Date/Time   NA 137 05/06/2015 0520   K 4.1 05/06/2015 0520   CL 101 05/06/2015 0520   CO2 26 05/06/2015 0520   GLUCOSE 190* 05/06/2015 0520   BUN 9 05/06/2015 0520   CREATININE 0.79 05/06/2015 0520   CALCIUM 8.9 05/06/2015 0520   GFRNONAA >60 05/06/2015 0520   GFRAA >60 05/06/2015 0520     Assessment/Plan: 1 Day Post-Op   Active Problems:   Diabetes mellitus ([place])   Essential hypertension   Acute on chronic diastolic heart failure (HCC)   Septic joint of right knee joint (HCC)   Diabetes mellitus with complication (HCC)   Chronic diastolic congestive heart failure ([place])   Up with therapy WBAT in the RLE in knee immobilizer Will have ID see today for their opinion on abx at discharge.   Will likely pull drain tomorrow.   Tynetta Bachmann Lelan Pons 05/06/2015, 9:48 AM Cell [phone]

## 2015-05-06 NOTE — Consult Note (Signed)
Date of Admission:  05/05/2015  Date of Consult:  05/06/2015  Reason for Consult: Septic right knee. Referring Physician: Dr. Percell Miller   HPI: [name] is an 50 y.o. female of [place] descent who had been found to be suffering from a group A strep bacteremia in January that was complicated by a large right shoulder abscess status post irrigation and debridement of the right shoulder on January 17. She was treated with antibiotics and narrow to high-dose IV penicillin and sent home on this. Apparently while on penicillin she developed some significant nausea as well as a low-grade temperature and she was switched over to levofloxacin. She did never had any overt evidence of an allergic reaction. She been followed closely by my partner Dr. Johnnye Sima as well as Dr. Percell Miller with orthopedic surgery. He then began to have right-sided leg and muscle pain and was thought to have suffered from either hematoma or Baker's cyst. She had an MRI done February 2 which should shown  IMPRESSION: 1. Large left Baker's cyst extending caudad along the superficial fascia margin of the medial head gastrocnemius, with nonenhancing complex internal material favoring mixed hematoma. Abscess is considered significantly less likely given the lack of a thick enhancing rind. 2. There is some abnormal edema in the gastrocnemius musculature on the right, such that myositis is not excluded. Given that the hematoma tracks along the superficial fascia margin, the possibility of a shearing injury in this vicinity is raised, correlate with recent trauma. 3. Asymmetric moderate-sized right knee joint effusion, very faint synovitis. 4. Subcutaneous edema around the knees and upper calves, right greater than left. 5. Low-level edema and enhancement in the anterior compartment of the RIGHT lower leg, most notably involving the extensor digitorum longus muscle. This is likely indicative of inflammation but  is otherwise nonspecific. Overall no effacement of anterior compartmental adipose tissue to suggest compartment syndrome on the left. 6. Degenerative findings in the right knee most notably in the patellofemoral joint.  In the interim she was seen by Orthopedic Surgery who performed aspirate of the knee and obtained aspirate with WBC > 61K. She was brought in and underwent arthroscopic synovectomy and lavage by Dr. French Ana on February 22. The operative report does not sound overwhelming for infection but further cultures were sent. Apparently cultures in the office have not yet yielded an organism though the patient of course was still on levofloxacin.  I had extensive discussion with the patient with regards to choice of antimicrobials going 40 and she has a very strong preference for staying on an oral albeit highly bioavailable antibiotics such as levofloxacin even though he does have a higher risk for C. difficile colitis than some parenteral agents. This is not altogether unreasonable and we will proceed with further levofloxacin unless we isolated a different culprit organism.   Past Medical History  Diagnosis Date  . Hypertension   . Anemia 03/2015    SEVERE   . Shortness of breath dyspnea   . Type II diabetes mellitus ([place])   . History of blood transfusion 03/2015    "related to bacteremia"    Past Surgical History  Procedure Laterality Date  . Myomectomy  06/2001    [facility] Endo 07/25/2010  . Oophorectomy Left     "I learned about having this OR hx 03/2015; I'll will check in to it"  . I&d extremity Right 03/30/2015    Procedure: IRRIGATION AND DEBRIDEMENT EXTREMITY;  Surgeon: Renette Butters, MD;  Location: [facility]  OR;  Service: Orthopedics;  Laterality: Right;  . Shoulder arthroscopy Right 03/30/2015    Procedure: ARTHROSCOPY SHOULDER;  Surgeon: Renette Butters, MD;  Location: [place];  Service: Orthopedics;  Laterality: Right;  . Colonoscopy with propofol N/A 04/14/2015    Procedure:  COLONOSCOPY WITH PROPOFOL;  Surgeon: Clarene Essex, MD;  Location: [facility] ENDOSCOPY;  Service: Endoscopy;  Laterality: N/A;  . Esophagogastroduodenoscopy N/A 04/16/2015    Procedure: ESOPHAGOGASTRODUODENOSCOPY (EGD);  Surgeon: Clarene Essex, MD;  Location: [facility] ENDOSCOPY;  Service: Endoscopy;  Laterality: N/A;  . Knee arthroscopy w/ synovectomy Right 05/05/2015    w/levage  . Reduction mammaplasty Bilateral 2006  . Synovectomy Right 05/05/2015    Procedure: ARTHROSCOPIC SYNOVECTOMY WITH LAVAGE;  Surgeon: Earlie Server, MD;  Location: [place];  Service: Orthopedics;  Laterality: Right;    Social History:  reports that she has never smoked. She has never used smokeless tobacco. She reports that she does not drink alcohol or use illicit drugs.   History reviewed. No pertinent family history.  No Known Allergies   Medications: I have reviewed patients current medications as documented in Epic Anti-infectives    Start     Dose/Rate Route Frequency Ordered Stop   05/06/15 0600  ceFAZolin (ANCEF) IVPB 2 g/50 mL premix  Status:  Discontinued     2 g 100 mL/hr over 30 Minutes Intravenous On call to O.R. 05/05/15 1233 05/06/15 1517   05/05/15 1800  cefTRIAXone (ROCEPHIN) 2 g in dextrose 5 % 50 mL IVPB  Status:  Discontinued     2 g 100 mL/hr over 30 Minutes Intravenous Every 24 hours 05/05/15 1519 05/06/15 1517   05/05/15 1700  levofloxacin (LEVAQUIN) tablet 750 mg     750 mg Oral Every 24 hours 05/05/15 1607     05/05/15 1615  ceFAZolin (ANCEF) IVPB 1 g/50 mL premix  Status:  Discontinued     1 g 100 mL/hr over 30 Minutes Intravenous Every 6 hours 05/05/15 1607 05/05/15 1614   05/05/15 1233  ceFAZolin (ANCEF) 2-3 GM-% IVPB SOLR    Comments:  Scronce, Trina   : cabinet override      05/05/15 1233 05/05/15 1325         ROS:as in HPI otherwise remainder of 12 point Review of Systems is negative    Blood pressure 134/79, pulse 105, temperature 99.5 F (37.5 C), temperature source Oral, resp. rate 18,  weight 157 lb (71.215 kg), SpO2 100 %. General: Alert and awake, oriented x3, not in any acute distress. HEENT: anicteric sclera,  EOMI, oropharynx clear and without exudate Cardiovascular: egular rate, normal r,  no murmur rubs or gallops Pulmonary: clear to auscultation bilaterally, no wheezing, rales or rhonchi Gastrointestinal: soft nontender, nondistended, normal bowel sounds, Musculoskeletal: Right knee with dressing , shoulder with reasonable range of motion  Skin, soft tissue: no rashes Neuro: nonfocal, strength and sensation intact   Results for orders placed or performed during the hospital encounter of 05/05/15 (from the past 48 hour(s))  CBC     Status: Abnormal   Collection Time: 05/05/15 12:46 PM  Result Value Ref Range   WBC 7.7 4.0 - 10.5 K/uL   RBC 3.04 (L) 3.87 - 5.11 MIL/uL   Hemoglobin 7.9 (L) 12.0 - 15.0 g/dL   HCT 24.5 (L) 36.0 - 46.0 %   MCV 80.6 78.0 - 100.0 fL   MCH 26.0 26.0 - 34.0 pg   MCHC 32.2 30.0 - 36.0 g/dL   RDW 14.6 11.5 - 15.5 %  Platelets 414 (H) 150 - 400 K/uL  Basic metabolic panel     Status: Abnormal   Collection Time: 05/05/15 12:46 PM  Result Value Ref Range   Sodium 133 (L) 135 - 145 mmol/L   Potassium 4.2 3.5 - 5.1 mmol/L    Comment: HEMOLYSIS AT THIS LEVEL MAY AFFECT RESULT   Chloride 98 (L) 101 - 111 mmol/L   CO2 21 (L) 22 - 32 mmol/L   Glucose, Bld 265 (H) 65 - 99 mg/dL   BUN 13 6 - 20 mg/dL   Creatinine, Ser 0.91 0.44 - 1.00 mg/dL   Calcium 9.4 8.9 - 10.3 mg/dL   GFR calc non Af Amer >60 >60 mL/min   GFR calc Af Amer >60 >60 mL/min    Comment: (NOTE) The eGFR has been calculated using the CKD EPI equation. This calculation has not been validated in all clinical situations. eGFR's persistently <60 mL/min signify possible Chronic Kidney Disease.    Anion gap 14 5 - 15  Glucose, capillary     Status: Abnormal   Collection Time: 05/05/15 12:51 PM  Result Value Ref Range   Glucose-Capillary 242 (H) 65 - 99 mg/dL  Body fluid  culture     Status: None (Preliminary result)   Collection Time: 05/05/15  1:42 PM  Result Value Ref Range   Specimen Description FLUID SYNOVIAL RIGHT KNEE    Special Requests NONE    Gram Stain      MODERATE WBC PRESENT,BOTH PMN AND MONONUCLEAR NO ORGANISMS SEEN    Culture NO GROWTH < 24 HOURS    Report Status PENDING   Glucose, capillary     Status: Abnormal   Collection Time: 05/05/15  2:19 PM  Result Value Ref Range   Glucose-Capillary 228 (H) 65 - 99 mg/dL   Comment 1 Notify RN   Glucose, capillary     Status: Abnormal   Collection Time: 05/05/15  4:22 PM  Result Value Ref Range   Glucose-Capillary 227 (H) 65 - 99 mg/dL  Glucose, capillary     Status: Abnormal   Collection Time: 05/05/15 10:21 PM  Result Value Ref Range   Glucose-Capillary 252 (H) 65 - 99 mg/dL  Comprehensive metabolic panel     Status: Abnormal   Collection Time: 05/06/15  5:20 AM  Result Value Ref Range   Sodium 137 135 - 145 mmol/L   Potassium 4.1 3.5 - 5.1 mmol/L   Chloride 101 101 - 111 mmol/L   CO2 26 22 - 32 mmol/L   Glucose, Bld 190 (H) 65 - 99 mg/dL   BUN 9 6 - 20 mg/dL   Creatinine, Ser 0.79 0.44 - 1.00 mg/dL   Calcium 8.9 8.9 - 10.3 mg/dL   Total Protein 6.5 6.5 - 8.1 g/dL   Albumin 2.0 (L) 3.5 - 5.0 g/dL   AST 22 15 - 41 U/L   ALT 11 (L) 14 - 54 U/L   Alkaline Phosphatase 54 38 - 126 U/L   Total Bilirubin 0.1 (L) 0.3 - 1.2 mg/dL   GFR calc non Af Amer >60 >60 mL/min   GFR calc Af Amer >60 >60 mL/min    Comment: (NOTE) The eGFR has been calculated using the CKD EPI equation. This calculation has not been validated in all clinical situations. eGFR's persistently <60 mL/min signify possible Chronic Kidney Disease.    Anion gap 10 5 - 15  Glucose, capillary     Status: Abnormal   Collection Time: 05/06/15 11:48 AM  Result  Value Ref Range   Glucose-Capillary 180 (H) 65 - 99 mg/dL   Comment 1 Notify RN    @BRIEFLABTABLE (sdes,specrequest,cult,reptstatus)   ) Recent Results (from  the past 720 hour(s))  Culture, routine-abscess     Status: None   Collection Time: 04/09/15  4:01 AM  Result Value Ref Range Status   Specimen Description ABSCESS  Final   Special Requests RIGHT CALF  Final   Gram Stain   Final    ABUNDANT WBC PRESENT,BOTH PMN AND MONONUCLEAR NO SQUAMOUS EPITHELIAL CELLS SEEN NO ORGANISMS SEEN Performed at [facility]    Culture   Final    NO GROWTH 2 DAYS Performed at [facility]    Report Status 04/11/2015 FINAL  Final  Blood culture (routine x 2)     Status: None   Collection Time: 04/12/15 10:30 PM  Result Value Ref Range Status   Specimen Description BLOOD LEFT ARM  Final   Special Requests BOTTLES DRAWN AEROBIC AND ANAEROBIC 5CC  Final   Culture NO GROWTH 5 DAYS  Final   Report Status 04/17/2015 FINAL  Final  Blood culture (routine x 2)     Status: None   Collection Time: 04/12/15 10:39 PM  Result Value Ref Range Status   Specimen Description BLOOD LEFT HAND  Final   Special Requests BOTTLES DRAWN AEROBIC AND ANAEROBIC 5CC  Final   Culture NO GROWTH 5 DAYS  Final   Report Status 04/17/2015 FINAL  Final  Urine culture     Status: None   Collection Time: 04/12/15 11:28 PM  Result Value Ref Range Status   Specimen Description URINE, CLEAN CATCH  Final   Special Requests NONE  Final   Culture NO GROWTH 1 DAY  Final   Report Status 04/14/2015 FINAL  Final  MRSA PCR Screening     Status: None   Collection Time: 04/13/15  2:22 AM  Result Value Ref Range Status   MRSA by PCR NEGATIVE NEGATIVE Final    Comment:        The GeneXpert MRSA Assay (FDA approved for NASAL specimens only), is one component of a comprehensive MRSA colonization surveillance program. It is not intended to diagnose MRSA infection nor to guide or monitor treatment for MRSA infections.   Body fluid culture     Status: None   Collection Time: 04/22/15  3:00 PM  Result Value Ref Range Status   Specimen Description FLUID RIGHT LEG  Final    Special Requests BAKER'S CYST  Final   Gram Stain   Final    ABUNDANT WBC PRESENT,BOTH PMN AND MONONUCLEAR NO ORGANISMS SEEN    Culture NO GROWTH 3 DAYS  Final   Report Status 04/26/2015 FINAL  Final  Body fluid culture     Status: None (Preliminary result)   Collection Time: 05/05/15  1:42 PM  Result Value Ref Range Status   Specimen Description FLUID SYNOVIAL RIGHT KNEE  Final   Special Requests NONE  Final   Gram Stain   Final    MODERATE WBC PRESENT,BOTH PMN AND MONONUCLEAR NO ORGANISMS SEEN    Culture NO GROWTH < 24 HOURS  Final   Report Status PENDING  Incomplete     Impression/Recommendation  Active Problems:   Diabetes mellitus ([place])   Essential hypertension   Acute on chronic diastolic heart failure (HCC)   Septic joint of right knee joint (HCC)   Diabetes mellitus with complication (HCC)   Chronic diastolic congestive heart failure ([place])  [name] is a 50 y.o. female with history of group A streptococcal bacteremia and septic shoulder status post I&D of right shoulder in January status post IV penicillin and then levofloxacin orally then developed right knee and leg swelling and edema and pain found to have a Baker's cyst and then a right knee effusion that was tapped and found to be consistent with infection status post arthroscopic debridement and lavage.  #1 Septic knee: Likely seeded by her original group A strep bacteremia:  We'll continue her on oral levofloxacin unless we isolated different pathogen from these cultures  I would treat her for 6 weeks postoperatively with close follow-up with [place] in infectious disease as well as with orthopedic surgery.  #2 group A strep bacteremia and right septic shoulder: She seems over spine are well to therapy and no evidence of recurrence at her shoulder     05/06/2015, 3:18 PM   Thank you so much for this interesting consult  [place] for [place] [phone]  (pager) [phone] (office) 05/06/2015, 3:18 PM  Rhina [place] 05/06/2015, 3:18 PM

## 2015-05-06 NOTE — Op Note (Signed)
[name], [name] NO.:  [id number]  MEDICAL RECORD NO.:  [id number]  LOCATION:  [id number]                        FACILITY:  [facility]  PHYSICIAN:  Lockie Pares, M.D.    DATE OF BIRTH:  Sep 18, 1965  DATE OF PROCEDURE:  05/05/2015 DATE OF DISCHARGE:                              OPERATIVE REPORT   INDICATIONS:  A 50 year old female, patient of Dr. Fredonia Highland with history of streptococcal infection of her shoulder presented to the office approximately 36 hours ago with a swollen right lower extremity and effusion, this effusion was tapped approximately 40 mL of blood- tinged cloudy fluid that had a negative Gram stain, but a white count of approximately 62,000 with no organisms seen.  I initially advised into the clinical context of infection that she was being treated with oral antibiotics that she may have had a suppressed infection causing this elevation of the count that the safest course would be lavage of this knee arthroscopically.  She initially declined this.  She was re- contacted by the office and on discussions with myself and other staff members in the office, the patient agreed to the procedure.  She was admitted at this point back also to potentially undergo more IV antibiotics and reassessment by Infectious Disease as well as possible collagen vascular workup.  PREOPERATIVE DIAGNOSIS:  Synovitis knee with elevated white count.  POSTOPERATIVE DIAGNOSIS:  Synovitis knee with elevated white count.  OPERATION: 1. Arthroscopic synovectomy. 2. Arthroscopic lavage.  SURGEON:  Lockie Pares, MD  ASSISTANTMarjo Bicker, PA  ANESTHESIA:  General anesthetic.  DESCRIPTION OF PROCEDURE:  Supine positioning with post arthroscopic portals created inferior medial, inferior laterally.  We noted nonspecific, but what appeared to be a proliferative synovitis in the knee, this could be consistent with reaction, although it should be thought that she probably does  merit collagen vascular workup.  We had tapped the knee, infiltrated corticosteroids within the last day.  There was only a small amount of fluid culture which we did send a secondary culture.  It should be noted in the record though that a culture was pending from the office from yesterday.  Again, the synovitis was debrided.  Did have the appearance of a proliferative synovitis in the anterior aspect of the knee, but also posteriorly around the PCL as well as in the recess around the popliteus, extensive synovectomy was carried out.  Along with that, we lavaged for total of about 9000 mL of lavage with irrigation and debridement.  Due to the questionable infection, we did elect to drain this with a Hemovac drain inserted in the portals sutured in with nylon.  Lightly compressive sterile dressing and knee immobilizer were applied, taken to recovery room in stable condition.     Lockie Pares, M.D.     WDC/MEDQ  D:  05/05/2015  T:  05/05/2015  Job:  [id number]

## 2015-05-06 NOTE — Progress Notes (Signed)
PROGRESS NOTE    [name]  [id number]  DOB: 12-13-1965  DOA: 05/05/2015 PCP: [name], MD Outpatient Specialists: Infectious disease: Dr. Joelene Millin course: 50 year old female with history of HTN, type II DM, chronic diastolic CHF, anemia recent streptococcal bacteremia with septic arthritis of right shoulder status post I&D on 03/30/15, initially treated with IV penicillin G and then changed to oral levofloxacin (followed by Dr. Johnnye Sima, ID), admitted by orthopedics on 05/05/15 for right knee septic arthritis-underwent arthroscopic synovectomy with lavage 2/22. TRH were consulted to evaluate and manage multiple medical issues.  Assessment & Plan:   Right knee septic arthritis - Management per primary/orthopedic service. - Patient underwent arthroscopic synovectomy and lavage on 2/22. Currently on IV Rocephin and oral levofloxacin. - Orthopedics consult during infectious disease for input - Improving.  Uncontrolled type II DM - Discontinue oral medications. Start low-dose Lantus and NovoLog SSI. Adjust as needed.  Essential hypertension - Patient was taking lisinopril, amlodipine and labetalol PTA-continue same. DC HCTZ and ARB (was not on this PTA). Controlled  Chronic diastolic CHF  - appears compensated clinically.  Anemia of chronic disease  - Recently had EGD and colonoscopy and no source of bleeding. Had a large leg hematoma in the past. Has been transfused PRBCs in the past.  - Follow CBCs and transfuse if hemoglobin less than 7 g per DL.  - Continue iron supplements.   Streptococcal bacteremia - Currently on IV Rocephin for septic arthritis and levofloxacin from PTA. Await ID input.  DVT prophylaxis: Lovenox Code Status: Full Family Communication: Discussed with patient's brother-in-law and aunt at bedside on 2/23. Disposition Plan: DC home when medically stable.    Consultants:  Orthopedics are primary service    Infectious  disease being consulted by orthopedics.  Procedures:  arthroscopic synovectomy and lavage on 2/22  Antimicrobials:  IV cefazolin 1 dose 2/22  IV Rocephin 2/22 >  Oral levofloxacin 2/22 >  Subjective: Right knee pain and swelling are better.   Objective: Filed Vitals:   05/05/15 2009 05/06/15 0041 05/06/15 0500 05/06/15 0957  BP: 146/80 141/74 138/74 155/77  Pulse:  99 100 95  Temp:  99.1 F (37.3 C) 98.4 F (36.9 C)   TempSrc:      Resp:  16 16   Weight:      SpO2:  100% 100% 100%    Intake/Output Summary (Last 24 hours) at 05/06/15 1333 Last data filed at 05/06/15 0503  Gross per 24 hour  Intake 1392.5 ml  Output    525 ml  Net  867.5 ml   Filed Weights   05/05/15 1245 05/05/15 1259  Weight: 72.122 kg (159 lb) 71.215 kg (157 lb)    Exam:  General exam: pleasant middle-aged female sitting up comfortably in bed. Does not look septic or toxic.  Respiratory system: Clear. No increased work of breathing. Cardiovascular system: S1 & S2 heard, RRR. No JVD, murmurs, gallops, clicks or pedal edema. Gastrointestinal system: Abdomen is nondistended, soft and nontender. Normal bowel sounds heard. Central nervous system: Alert and oriented. No focal neurological deficits. Extremities: Symmetric 5 x 5 power. Right knee postop dressing site clean and dry.   Data Reviewed: Basic Metabolic Panel:  Recent Labs Lab 05/05/15 1246 05/06/15 0520  NA 133* 137  K 4.2 4.1  CL 98* 101  CO2 21* 26  GLUCOSE 265* 190*  BUN 13 9  CREATININE 0.91 0.79  CALCIUM 9.4 8.9   Liver Function Tests:  Recent Labs Lab 05/06/15  0520  AST 22  ALT 11*  ALKPHOS 54  BILITOT 0.1*  PROT 6.5  ALBUMIN 2.0*   No results for input(s): LIPASE, AMYLASE in the last 168 hours. No results for input(s): AMMONIA in the last 168 hours. CBC:  Recent Labs Lab 05/05/15 1246  WBC 7.7  HGB 7.9*  HCT 24.5*  MCV 80.6  PLT 414*   Cardiac Enzymes: No results for input(s): CKTOTAL, CKMB,  CKMBINDEX, TROPONINI in the last 168 hours. BNP (last 3 results) No results for input(s): PROBNP in the last 8760 hours. CBG:  Recent Labs Lab 05/05/15 1251 05/05/15 1419 05/05/15 1622 05/05/15 2221 05/06/15 1148  GLUCAP 242* 228* 227* 252* 180*    Recent Results (from the past 240 hour(s))  Body fluid culture     Status: None (Preliminary result)   Collection Time: 05/05/15  1:42 PM  Result Value Ref Range Status   Specimen Description FLUID SYNOVIAL RIGHT KNEE  Final   Special Requests NONE  Final   Gram Stain   Final    MODERATE WBC PRESENT,BOTH PMN AND MONONUCLEAR NO ORGANISMS SEEN    Culture NO GROWTH < 24 HOURS  Final   Report Status PENDING  Incomplete         Studies: No results found.      Scheduled Meds: . amLODipine  10 mg Oral Daily  .  ceFAZolin (ANCEF) IV  2 g Intravenous On Call to OR  . cefTRIAXone (ROCEPHIN)  IV  2 g Intravenous Q24H  . chlorhexidine  60 mL Topical Once  . docusate sodium  100 mg Oral BID  . enoxaparin (LOVENOX) injection  40 mg Subcutaneous Q24H  . feeding supplement (ENSURE ENLIVE)  237 mL Oral BID BM  . ferrous sulfate  325 mg Oral BID WC  . insulin aspart  0-5 Units Subcutaneous QHS  . insulin aspart  0-9 Units Subcutaneous TID WC  . insulin glargine  5 Units Subcutaneous Daily  . labetalol  200 mg Oral TID  . levofloxacin  750 mg Oral Q24H  . lisinopril  20 mg Oral Daily  . multivitamin with minerals  1 tablet Oral Daily   Continuous Infusions: . sodium chloride    . sodium chloride 20 mL/hr at 05/06/15 0055  . lactated ringers 10 mL/hr at 05/05/15 1305    Active Problems:   Diabetes mellitus ([place])   Essential hypertension   Acute on chronic diastolic heart failure (HCC)   Septic joint of right knee joint (HCC)   Diabetes mellitus with complication (HCC)   Chronic diastolic congestive heart failure ([place])    Time spent: 45 minutes.    Vernell Leep, MD, FACP, FHM. [facility] Pager [phone]  [phone]  If 7PM-7AM, please contact night-coverage [email or web address] Password [email or web address] 05/06/2015, 1:33 PM    LOS: 1 day

## 2015-05-07 DIAGNOSIS — M00261 Other streptococcal arthritis, right knee: Secondary | ICD-10-CM | POA: Insufficient documentation

## 2015-05-07 DIAGNOSIS — M00861 Arthritis due to other bacteria, right knee: Secondary | ICD-10-CM

## 2015-05-07 DIAGNOSIS — I5032 Chronic diastolic (congestive) heart failure: Secondary | ICD-10-CM

## 2015-05-07 DIAGNOSIS — E0811 Diabetes mellitus due to underlying condition with ketoacidosis with coma: Secondary | ICD-10-CM

## 2015-05-07 DIAGNOSIS — D62 Acute posthemorrhagic anemia: Secondary | ICD-10-CM

## 2015-05-07 LAB — HEMOGLOBIN AND HEMATOCRIT, BLOOD
HCT: 22.8 % — ABNORMAL LOW (ref 36.0–46.0)
HCT: 25.4 % — ABNORMAL LOW (ref 36.0–46.0)
Hemoglobin: 7.5 g/dL — ABNORMAL LOW (ref 12.0–15.0)
Hemoglobin: 8.1 g/dL — ABNORMAL LOW (ref 12.0–15.0)

## 2015-05-07 LAB — CBC
HEMATOCRIT: 22 % — AB (ref 36.0–46.0)
HEMOGLOBIN: 7.2 g/dL — AB (ref 12.0–15.0)
MCH: 27.1 pg (ref 26.0–34.0)
MCHC: 32.7 g/dL (ref 30.0–36.0)
MCV: 82.7 fL (ref 78.0–100.0)
Platelets: 321 10*3/uL (ref 150–400)
RBC: 2.66 MIL/uL — ABNORMAL LOW (ref 3.87–5.11)
RDW: 15.1 % (ref 11.5–15.5)
WBC: 5.6 10*3/uL (ref 4.0–10.5)

## 2015-05-07 LAB — GLUCOSE, CAPILLARY
Glucose-Capillary: 200 mg/dL — ABNORMAL HIGH (ref 65–99)
Glucose-Capillary: 199 mg/dL — ABNORMAL HIGH (ref 65–99)
Glucose-Capillary: 199 mg/dL — ABNORMAL HIGH (ref 65–99)

## 2015-05-07 LAB — SEDIMENTATION RATE: Sed Rate: 129 mm/hr — ABNORMAL HIGH (ref 0–22)

## 2015-05-07 LAB — C-REACTIVE PROTEIN: CRP: 5 mg/dL — AB (ref <1.0)

## 2015-05-07 LAB — PREPARE RBC (CROSSMATCH)

## 2015-05-07 MED ORDER — ONDANSETRON HCL 4 MG PO TABS: 4.0000 mg | ORAL_TABLET | Freq: Three times a day (TID) | ORAL | Status: AC | PRN

## 2015-05-07 MED ORDER — SODIUM CHLORIDE 0.9 % IV SOLN
Freq: Once | INTRAVENOUS | Status: AC
  Administered 2015-05-07: 13:00:00 via INTRAVENOUS

## 2015-05-07 MED ORDER — OXYCODONE-ACETAMINOPHEN 5-325 MG PO TABS: 1.0000 | ORAL_TABLET | ORAL | Status: AC | PRN

## 2015-05-07 NOTE — Progress Notes (Signed)
Physical Therapy Treatment Patient Details Name: [name] MRN: [id number] DOB: [birth year] Today's Date: 05/07/2015    History of Present Illness 50 y.o. female admitted to [facility] on [facility]/05/15 for right leg/knee joint pain and swelling.  Pt underwent I&D and synovectomy of her right knee on 05/05/15.  Infectious disease MD also consulted.  Pt with significant PMHx of HTN, anemia, SOB, DM2, I &D and R shoulder arthroscopy 03/30/15.      PT Comments    Pt progressing well with gait and continues to have limited ROM and strength secondary to pain. Pt aware of need for KI with gait and standing, verbalized HEP and aware of progression with gait and decreased use of DME. Will continue to follow.   Follow Up Recommendations  Home health PT;Supervision - Intermittent     Equipment Recommendations       Recommendations for Other Services       Precautions / Restrictions Precautions Precautions: Knee Precaution Comments: knee exercise handout given Required Braces or Orthoses: Knee Immobilizer - Right Restrictions Weight Bearing Restrictions: Yes RLE Weight Bearing: Weight bearing as tolerated    Mobility  Bed Mobility               General bed mobility comments: in chair on arrival  Transfers Overall transfer level: Modified independent                  Ambulation/Gait Ambulation/Gait assistance: Supervision Ambulation Distance (Feet): 500 Feet Assistive device: Rolling walker (2 wheeled) Gait Pattern/deviations: Step-through pattern;Decreased stride length   Gait velocity interpretation: Below normal speed for age/gender General Gait Details: cues for step through pattern and decreased upper body use   Stairs            Wheelchair Mobility    Modified Rankin (Stroke Patients Only)       Balance                                    Cognition Arousal/Alertness: Awake/alert Behavior During Therapy: WFL for tasks  assessed/performed Overall Cognitive Status: Within Functional Limits for tasks assessed                      Exercises Total Joint Exercises Short Arc Quad: AROM;Right;10 reps;Seated Hip ABduction/ADduction: AROM;Right;10 reps;Seated Straight Leg Raises: AROM;Right;10 reps;Seated    General Comments        Pertinent Vitals/Pain Pain Score: 3  Pain Location: right knee Pain Descriptors / Indicators: Aching Pain Intervention(s): Limited activity within patient's tolerance;Premedicated before session;Monitored during session;Repositioned    Home Living                      Prior Function            PT Goals (current goals can now be found in the care plan section) Progress towards PT goals: Progressing toward goals    Frequency       PT Plan Current plan remains appropriate    Co-evaluation             End of Session Equipment Utilized During Treatment: Right knee immobilizer Activity Tolerance: Patient tolerated treatment well Patient left: in chair;with call bell/phone within reach     Time: [id number] PT Time Calculation (min) (ACUTE ONLY): 19 min  Charges:  $Gait Training: 8-22 mins  G CodesMelford Aase 06-02-2015, 11:19 AM Elwyn Reach, [place]

## 2015-05-07 NOTE — Progress Notes (Signed)
     Subjective:  POD #2 I/D of right septic knee joint. Patient reports pain as mild to moderate.  Up to a chair this afternoon.  Mobilized well with PT today.  1 unit of blood currently running due to hemoglobin of 7.2 today.  Plan for discharge to home today after blood transfusion.    Objective:   VITALS:   Filed Vitals:   05/06/15 2028 05/07/15 0644 05/07/15 1230 05/07/15 1248  BP: 132/72 167/91 135/76 151/70  Pulse: 105 103 95 98  Temp: 98.5 F (36.9 C) 98.4 F (36.9 C) 98.7 F (37.1 C) 99 F (37.2 C)  TempSrc: Oral Oral Oral Oral  Resp: 16 16 16 16   Weight:      SpO2: 100% 99% 100%     Neurologically intact ABD soft Neurovascular intact Sensation intact distally Intact pulses distally Dorsiflexion/Plantar flexion intact Incision: no drainage Drain pulled today and dry dressing applied  Lab Results  Component Value Date   WBC 5.6 05/07/2015   HGB 7.5* 05/07/2015   HCT 22.8* 05/07/2015   MCV 82.7 05/07/2015   PLT 321 05/07/2015   BMET    Component Value Date/Time   NA 137 05/06/2015 0520   K 4.1 05/06/2015 0520   CL 101 05/06/2015 0520   CO2 26 05/06/2015 0520   GLUCOSE 190* 05/06/2015 0520   BUN 9 05/06/2015 0520   CREATININE 0.79 05/06/2015 0520   CALCIUM 8.9 05/06/2015 0520   GFRNONAA >60 05/06/2015 0520   GFRAA >60 05/06/2015 0520     Assessment/Plan: 2 Days Post-Op   Principal Problem:   Septic joint of right knee joint (HCC) Active Problems:   Diabetes mellitus (HCC)   Essential hypertension   Acute on chronic diastolic heart failure (HCC)   Diabetes mellitus with complication (HCC)   Chronic diastolic congestive heart failure (HCC)   Sepsis due to group A Streptococcus (HCC)   Streptococcal arthritis of right shoulder (HCC)   Streptococcal arthritis of right knee (HCC)   Postoperative anemia due to acute blood loss   Up with therapy WBAT in the RLE Knee immobilizer for comfort only To discharge today after blood transfusion.   Will follow up with ID and PCP at discharge.  Will also be seen back in our office as outpatient in one week.    Nadie Fiumara Lelan Pons 05/07/2015, 3:10 PM Cell [phone]

## 2015-05-07 NOTE — Progress Notes (Signed)
PROGRESS NOTE    [name]  [id number]  DOB: 1966/01/21  DOA: 05/05/2015 PCP: [name], MD Outpatient Specialists: Infectious disease: Dr. Joelene Millin course: 50 year old female with history of HTN, type II DM, chronic diastolic CHF, anemia recent streptococcal bacteremia with septic arthritis of right shoulder status post I&D on 03/30/15, initially treated with IV penicillin G and then changed to oral levofloxacin (followed by Dr. Johnnye Sima, ID), admitted by orthopedics on 05/05/15 for right knee septic arthritis-underwent arthroscopic synovectomy with lavage 2/22. TRH were consulted to evaluate and manage multiple medical issues.  Assessment & Plan:   Right knee septic arthritis - Management per primary/orthopedic service. - Patient underwent arthroscopic synovectomy and lavage on 2/22. Was on IV Rocephin and oral levofloxacin. - Infectious disease input appreciated: Recommend continue oral levofloxacin unless different pathogen isolated from cultures and recommends six-week course. Outpatient close follow-up with infectious disease/Dr. Bobby Rumpf. Final cultures to be followed up by orthopedics/ID as outpatient.  Uncontrolled type II DM - Oral medications were held in the hospital and patient was treated with low dose of Lantus and sliding scale insulin. Intra-articular steroid injection may have contribute it to some of her hyperglycemia. - Recommend resuming home medications/metformin at discharge and close outpatient follow-up with her PCP next week for further adjustment as needed.  Essential hypertension - Patient was taking lisinopril, amlodipine and labetalol PTA-continue same. DC HCTZ and ARB (was not on this PTA). Mildly uncontrolled at times. Continue home regimen at discharge and outpatient follow-up with PCP next week.  Chronic diastolic CHF  - appears compensated clinically.  Anemia of chronic disease  - Recently had EGD and  colonoscopy and no source of bleeding. Had a large leg hematoma in the past. Has been transfused PRBCs in the past.  - Continue iron supplements.  - Hemoglobin today 7.2>7.5. Patient asymptomatic of dizziness, lightheadedness, chest pain or palpitations. Current drop in hemoglobin likely secondary to acute illness/acute infection. Provided patient options of no transfusion but close outpatient follow-up with repeat CBC next week and consider transfusion at that time if hemoglobin less than 7 g per DL. Versus second option of transfusing 1 unit PRBC prior to discharge. After careful consideration, patient opted to get a unit of blood transfusion prior to discharge. - Follow-up outpatient with PCP next week with repeat CBC.  Streptococcal bacteremia and septic right shoulder - Continue oral levofloxacin although seems to have completed course of treatment for this indication. Outpatient follow-up with infectious disease.  DVT prophylaxis: Lovenox Code Status: Full Family Communication: Discussed with patient's brother-in-law at bedside on 2/24. Disposition Plan: Discussed extensively with orthopedics team including mass Lovett Calender, PA-C and Dr. Fredonia Highland. They plan to discharge patient home today. TRH will sign off at this time. Please consult again for further assistance.   Consultants:  Orthopedics are primary service    Infectious disease being consulted by orthopedics.  Procedures:  arthroscopic synovectomy and lavage on 2/22  Antimicrobials:  IV cefazolin 1 dose 2/22  IV Rocephin 2/22 >  Oral levofloxacin 2/22 >  Subjective: Right knee pain and swelling have improved. No new complaints reported.  Objective: Filed Vitals:   05/06/15 2028 05/07/15 0644 05/07/15 1230 05/07/15 1248  BP: 132/72 167/91 135/76 151/70  Pulse: 105 103 95 98  Temp: 98.5 F (36.9 C) 98.4 F (36.9 C) 98.7 F (37.1 C) 99 F (37.2 C)  TempSrc: Oral Oral Oral Oral  Resp: 16 16 16 16   Weight:  SpO2: 100% 99% 100%     Intake/Output Summary (Last 24 hours) at 05/07/15 1316 Last data filed at 05/07/15 1248  Gross per 24 hour  Intake    575 ml  Output     25 ml  Net    550 ml   Filed Weights   05/05/15 1245 05/05/15 1259  Weight: 72.122 kg (159 lb) 71.215 kg (157 lb)    Exam:  General exam: pleasant middle-aged female sitting up comfortably in bed. Does not look septic or toxic.  Respiratory system: Clear. No increased work of breathing. Cardiovascular system: S1 & S2 heard, RRR. No JVD, murmurs, gallops, clicks or pedal edema. Gastrointestinal system: Abdomen is nondistended, soft and nontender. Normal bowel sounds heard. Central nervous system: Alert and oriented. No focal neurological deficits. Extremities: Symmetric 5 x 5 power. Right knee postop dressing site clean and dry. Drain was still in place this morning.   Data Reviewed: Basic Metabolic Panel:  Recent Labs Lab 05/05/15 1246 05/06/15 0520  NA 133* 137  K 4.2 4.1  CL 98* 101  CO2 21* 26  GLUCOSE 265* 190*  BUN 13 9  CREATININE 0.91 0.79  CALCIUM 9.4 8.9   Liver Function Tests:  Recent Labs Lab 05/06/15 0520  AST 22  ALT 11*  ALKPHOS 54  BILITOT 0.1*  PROT 6.5  ALBUMIN 2.0*   No results for input(s): LIPASE, AMYLASE in the last 168 hours. No results for input(s): AMMONIA in the last 168 hours. CBC:  Recent Labs Lab 05/05/15 1246 05/07/15 0542 05/07/15 0934  WBC 7.7 5.6  --   HGB 7.9* 7.2* 7.5*  HCT 24.5* 22.0* 22.8*  MCV 80.6 82.7  --   PLT 414* 321  --    Cardiac Enzymes: No results for input(s): CKTOTAL, CKMB, CKMBINDEX, TROPONINI in the last 168 hours. BNP (last 3 results) No results for input(s): PROBNP in the last 8760 hours. CBG:  Recent Labs Lab 05/06/15 1148 05/06/15 1652 05/06/15 2156 05/07/15 0643 05/07/15 1126  GLUCAP 180* 190* 164* 200* 199*    Recent Results (from the past 240 hour(s))  Anaerobic culture     Status: None (Preliminary result)    Collection Time: 05/05/15  1:42 PM  Result Value Ref Range Status   Specimen Description FLUID SYNOVIAL RIGHT KNEE  Final   Special Requests NONE  Final   Gram Stain   Final    MODERATE WBC PRESENT,BOTH PMN AND MONONUCLEAR NO ORGANISMS SEEN    Culture   Final    NO ANAEROBES ISOLATED; CULTURE IN PROGRESS FOR 5 DAYS   Report Status PENDING  Incomplete  Body fluid culture     Status: None (Preliminary result)   Collection Time: 05/05/15  1:42 PM  Result Value Ref Range Status   Specimen Description FLUID SYNOVIAL RIGHT KNEE  Final   Special Requests NONE  Final   Gram Stain   Final    MODERATE WBC PRESENT,BOTH PMN AND MONONUCLEAR NO ORGANISMS SEEN    Culture NO GROWTH 2 DAYS  Final   Report Status PENDING  Incomplete         Studies: No results found.      Scheduled Meds: . amLODipine  10 mg Oral Daily  . chlorhexidine  60 mL Topical Once  . docusate sodium  100 mg Oral BID  . enoxaparin (LOVENOX) injection  40 mg Subcutaneous Q24H  . feeding supplement (GLUCERNA SHAKE)  237 mL Oral BID BM  . ferrous sulfate  325 mg  Oral BID WC  . insulin aspart  0-5 Units Subcutaneous QHS  . insulin aspart  0-9 Units Subcutaneous TID WC  . insulin glargine  5 Units Subcutaneous Daily  . labetalol  200 mg Oral TID  . levofloxacin  750 mg Oral Q24H  . lisinopril  20 mg Oral Daily  . multivitamin with minerals  1 tablet Oral Daily   Continuous Infusions: . lactated ringers 10 mL/hr at 05/05/15 1305    Active Problems:   Diabetes mellitus ([place])   Essential hypertension   Acute on chronic diastolic heart failure (HCC)   Septic joint of right knee joint (HCC)   Diabetes mellitus with complication (HCC)   Chronic diastolic congestive heart failure ([place])   Sepsis due to group A Streptococcus (HCC)   Streptococcal arthritis of right shoulder ([place])    Time spent: 25 minutes.    Vernell Leep, MD, FACP, FHM. [facility] Pager [phone]  If 7PM-7AM, please  contact night-coverage [email or web address] Password [email or web address] 05/07/2015, 1:16 PM    LOS: 2 days

## 2015-05-07 NOTE — Discharge Summary (Signed)
Physician Discharge Summary  Patient ID: [name] MRN: [id number] DOB/AGE: 13-Apr-1965 50 y.o.  Admit date: 05/05/2015 Discharge date: 05/07/2015  Admission Diagnoses:  Septic joint of right knee joint [facility])  Discharge Diagnoses:  Principal Problem:   Septic joint of right knee joint ([place]) Active Problems:   Diabetes mellitus ([place])   Essential hypertension   Acute on chronic diastolic heart failure (HCC)   Diabetes mellitus with complication (HCC)   Chronic diastolic congestive heart failure (HCC)   Sepsis due to group A Streptococcus (HCC)   Streptococcal arthritis of right shoulder (HCC)   Streptococcal arthritis of right knee (HCC)   Postoperative anemia due to acute blood loss   Past Medical History  Diagnosis Date  . Hypertension   . Anemia 03/2015    SEVERE   . Shortness of breath dyspnea   . Type II diabetes mellitus ([place])   . History of blood transfusion 03/2015    "related to bacteremia"    Surgeries: Procedure(s): ARTHROSCOPIC SYNOVECTOMY WITH LAVAGE on 05/05/2015   Consultants (if any):    Discharged Condition: Improved  Hospital Course: [name] is an 50 y.o. female who was admitted 05/05/2015 with a diagnosis of Septic joint of right knee joint ([place]) and went to the operating room on 05/05/2015 and underwent the above named procedures.    She was given perioperative antibiotics:  Anti-infectives    Start     Dose/Rate Route Frequency Ordered Stop   05/06/15 0600  ceFAZolin (ANCEF) IVPB 2 g/50 mL premix  Status:  Discontinued     2 g 100 mL/hr over 30 Minutes Intravenous On call to O.R. 05/05/15 1233 05/06/15 1517   05/05/15 1800  cefTRIAXone (ROCEPHIN) 2 g in dextrose 5 % 50 mL IVPB  Status:  Discontinued     2 g 100 mL/hr over 30 Minutes Intravenous Every 24 hours 05/05/15 1519 05/06/15 1517   05/05/15 1700  levofloxacin (LEVAQUIN) tablet 750 mg     750 mg Oral Every 24 hours 05/05/15 1607     05/05/15 1615  ceFAZolin (ANCEF) IVPB 1 g/50  mL premix  Status:  Discontinued     1 g 100 mL/hr over 30 Minutes Intravenous Every 6 hours 05/05/15 1607 05/05/15 1614   05/05/15 1233  ceFAZolin (ANCEF) 2-3 GM-% IVPB SOLR    Comments:  Scronce, Trina   : cabinet override      05/05/15 1233 05/05/15 1325    .  She was given sequential compression devices, early ambulation, and ASA 325mg  for DVT prophylaxis.  She benefited maximally from the hospital stay and there were no complications.    Recent vital signs:  Filed Vitals:   05/07/15 1230 05/07/15 1248  BP: 135/76 151/70  Pulse: 95 98  Temp: 98.7 F (37.1 C) 99 F (37.2 C)  Resp: 16 16    Recent laboratory studies:  Lab Results  Component Value Date   HGB 7.5* 05/07/2015   HGB 7.2* 05/07/2015   HGB 7.9* 05/05/2015   Lab Results  Component Value Date   WBC 5.6 05/07/2015   PLT 321 05/07/2015   Lab Results  Component Value Date   INR 1.41 03/27/2015   Lab Results  Component Value Date   NA 137 05/06/2015   K 4.1 05/06/2015   CL 101 05/06/2015   CO2 26 05/06/2015   BUN 9 05/06/2015   CREATININE 0.79 05/06/2015   GLUCOSE 190* 05/06/2015    Discharge Medications:     Medication List  TAKE these medications        acetaminophen 325 MG tablet  Commonly known as:  TYLENOL  Take 325 mg by mouth every 6 (six) hours as needed for mild pain or moderate pain.     amLODipine 10 MG tablet  Commonly known as:  NORVASC  Take 1 tablet (10 mg total) by mouth daily.     feeding supplement (GLUCERNA SHAKE) Liqd  Take 237 mLs by mouth 3 (three) times daily between meals.     ferrous sulfate 325 (65 FE) MG tablet  Take 1 tablet (325 mg total) by mouth 2 (two) times daily with a meal.     labetalol 200 MG tablet  Commonly known as:  NORMODYNE  Take 1 tablet (200 mg total) by mouth 3 (three) times daily.     levofloxacin 750 MG tablet  Commonly known as:  LEVAQUIN  Take 1 tablet (750 mg total) by mouth daily.     lisinopril 20 MG tablet  Commonly known as:   PRINIVIL,ZESTRIL  Take 1 tablet (20 mg total) by mouth daily.     metFORMIN 500 MG tablet  Commonly known as:  GLUCOPHAGE  Take 500 mg by mouth 2 (two) times daily.     multivitamin tablet  Take 1 tablet by mouth daily.     ondansetron 4 MG tablet  Commonly known as:  ZOFRAN  Take 1 tablet (4 mg total) by mouth every 8 (eight) hours as needed for nausea or vomiting.     oxyCODONE-acetaminophen 5-325 MG tablet  Commonly known as:  PERCOCET  Take 1-2 tablets by mouth every 4 (four) hours as needed for severe pain.        Diagnostic Studies: Dg Chest 2 View  04/09/2015  CLINICAL DATA:  Bilateral leg swelling with pain in the posterior leg since last night. PICC line. History of hypertension and diabetes. Recent shoulder surgery. EXAM: CHEST  2 VIEW COMPARISON:  03/31/2015 FINDINGS: Interval placement of a right central venous catheter with tip over the low SVC region. No pneumothorax. Mild cardiac enlargement with mild diffuse pulmonary vascular congestion. Interstitial changes in the lungs suggesting interstitial edema. Small bilateral pleural effusions with basilar atelectasis. Anterior inferior appearance of the humeral head with respect to the glenoid fossa may be postoperative but could indicate anterior dislocation of the right shoulder. IMPRESSION: Right central venous catheter appears in satisfactory position. No pneumothorax. Pulmonary vascular congestion with interstitial edema, small bilateral pleural effusions, and basilar atelectasis. Possible anterior dislocation of the right shoulder. Electronically Signed   By: Lucienne Capers M.D.   On: 04/09/2015 02:06   Ct Angio Chest Pe W/cm &/or Wo Cm  04/13/2015  CLINICAL DATA:  Shortness of breath and elevated D-dimer. EXAM: CT ANGIOGRAPHY CHEST WITH CONTRAST TECHNIQUE: Multidetector CT imaging of the chest was performed using the standard protocol during bolus administration of intravenous contrast. Multiplanar CT image reconstructions  and MIPs were obtained to evaluate the vascular anatomy. CONTRAST:  58mL OMNIPAQUE IOHEXOL 350 MG/ML SOLN COMPARISON:  None. FINDINGS: THORACIC INLET/BODY WALL: Body wall edema. Tunneled right IJ line with tip at the upper cavoatrial junction. MEDIASTINUM: Normal heart size. No pericardial effusion. No acute vascular abnormality including pulmonary embolism or aortic dissection. No adenopathy. LUNG WINDOWS: There is symmetric central ground-glass opacity with diffuse interlobular septal thickening. Small borderline moderate pleural effusions with fissural extension on the right and mild lung scalloping suggesting early loculation. UPPER ABDOMEN: No acute findings. OSSEOUS: Large right glenohumeral joint effusion which is partially  visualized, known based on previous MRI. There is persistent expansion and edematous appearance of the rotator cuff muscles. No erosive changes seen in the visualized joint. 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 Mass measures 4 cm in diameter and 8 cm in length. Review of the MIP images confirms the above findings. IMPRESSION: 1. Negative for pulmonary embolism. 2. Pulmonary edema. Small to moderate bilateral pleural effusion with signs of early loculation. Mild atelectasis. 3. Septic arthritis of the right glenohumeral joint with large effusion that is grossly similar to 04/06/2015 MRI. Electronically Signed   By: Monte Fantasia M.D.   On: 04/13/2015 05:27   Ct Angio Ao+bifem W/cm &/or Wo/cm  04/09/2015  CLINICAL DATA:  Increased swelling of the right calf and fever. Patient was just discharged for bacteremia. EXAM: CT ANGIOGRAPHY OF ABDOMINAL AORTA WITH ILIOFEMORAL RUNOFF TECHNIQUE: Multidetector CT imaging of the abdomen, pelvis and lower extremities was performed using the standard protocol during bolus administration of intravenous contrast. Multiplanar CT image reconstructions and MIPs were obtained to evaluate  the vascular anatomy. CONTRAST:  118mL OMNIPAQUE IOHEXOL 350 MG/ML SOLN COMPARISON:  None. FINDINGS: Aorta: Normal caliber abdominal aorta. No evidence of aortic dissection. The abdominal aorta, celiac axis, superior mesenteric artery, bilateral renal arteries, inferior mesenteric artery, and bilateral iliac, external iliac, internal iliac, and common femoral arteries are patent. Renal nephrograms are symmetrical. Right Lower Extremity: The right common femoral, deep femoral, superficial femoral, popliteal, tibial trunk, and tibial trifurcation vessels are patent to the right ankle. No evidence of significant vascular occlusion. Left Lower Extremity: The left common femoral, deep femoral, superficial femoral, popliteal, tibial trunk, and tibial trifurcation vessels are patent to the left ankle. No evidence of significant vascular occlusion. With Abdomen: Small bilateral pleural effusions with basilar atelectasis. Cholelithiasis with small stones in the gallbladder. No gallbladder wall thickening or bile duct dilatation. The liver, spleen, pancreas, adrenal glands, kidneys, inferior vena cava, and retroperitoneal lymph nodes are unremarkable. There is somewhat hazy infiltration in the retroperitoneal fat. This could represent retroperitoneal fibrosis or inflammatory changes. No discrete abscess. Retroperitoneal lymph nodes are likely reactive. Stomach, small bowel, and colon are decompressed. No free air or free fluid in the abdomen. Is edema in the subcutaneous fat. Pelvis: Diffuse enlargement of the uterus with multiple calcified fibroids. No abnormal adnexal masses. Appendix is normal. Small amount of free fluid in the pelvis. Bladder wall is not thickened. No pelvic lymphadenopathy. Degenerative changes in the lumbar spine. No destructive bone lesions. Lower extremities: Edema is demonstrated in the subcutaneous fat of the lower legs bilaterally. This is greater on the right. In the posterior compartment  musculature of the right calf beginning at the level of the knee, there is a heterogeneous collection measuring 1.7 x 4.2 cm. This could represent abscess or hematoma. Consider ultrasound for further evaluation if clinically indicated. Small right popliteal cyst. Small left popliteal cyst. Bones appear intact. No evidence of acute fracture or bone erosion. No evidence of osteomyelitis. Review of the MIP images confirms the above findings. IMPRESSION: Abdominal aorta and runoff vessels are patent bilaterally to the ankles. No evidence of vascular stenosis or occlusion. Small bilateral pleural effusions with basilar atelectasis in the lungs. Diffuse edema throughout the subcutaneous fat of the abdomen and of the lower legs, greater on the right. Heterogeneous fluid collection in the posterior compartment of the right lower leg beginning at the level of the knee suggesting hematoma or abscess. Cholelithiasis. Hazy infiltration of  the retroperitoneal fat suggesting infectious/ inflammatory process versus retroperitoneal fibrosis. Electronically Signed   By: Lucienne Capers M.D.   On: 04/09/2015 02:55   Mr Tibia Fibula Right W Wo Contrast  04/16/2015  ADDENDUM REPORT: 04/16/2015 08:57 ADDENDUM: The original report was by Dr. Van Clines. The following addendum is by Dr. Van Clines: Please note that in the second sentence under the Findings and in the first Impression, the hemorrhagic Bakers cyst is on the *RIGHT* side, not the left. Also, a CT angiogram runoff was performed on 04/09/2015 which also showed the Baker's cyst/hematoma. At that time, the volume was 67 cc. On today's exam, the volume is 164 cc. Electronically Signed   By: Van Clines M.D.   On: 04/16/2015 08:57  04/16/2015  CLINICAL DATA:  Increased swelling with pain and erythema in the right lower leg. Bacteremia. EXAM: MRI OF LOWER RIGHT EXTREMITY WITHOUT AND WITH CONTRAST TECHNIQUE: Multiplanar, multisequence MR imaging of the right  lower leg was performed both before and after administration of intravenous contrast. We centered high enough to include the knees and distal femurs but exclude the ankles in order to fully encompass the region of concern. Please note that although this exam includes the knees, this is not an orthopedic knee assessment. CONTRAST:  47mL MULTIHANCE GADOBENATE DIMEGLUMINE 529 MG/ML IV SOLN COMPARISON:  None. FINDINGS: Moderate right knee effusion with mild synovitis. Complex Baker's cyst tracking caudad superficial to the medial head gastrocnemius on the left, size 15.6 by 6.7 by 3.0 cm, heterogeneous internal T2 signal with faintly heterogeneous T1 signal, likely representing blood products or less likely synovitis within this lesion. There is asymmetric edema in the medial head gastrocnemius and to a lesser extent the lateral head gastrocnemius on the right compared to the left. Diffuse subcutaneous edema around the knee and upper calf bilaterally but right greater than left. Edema tracks along the superficial fascia bilaterally and also to a lesser extent within the left anterior compartment. Degenerative osteochondral lesion along the lateral patellar facet on the right, along with spurring of the patella and mild marginal spurring of the medial compartment of the right knee. IMPRESSION: 1. Large left Baker's cyst extending caudad along the superficial fascia margin of the medial head gastrocnemius, with nonenhancing complex internal material favoring mixed hematoma. Abscess is considered significantly less likely given the lack of a thick enhancing rind. 2. There is some abnormal edema in the gastrocnemius musculature on the right, such that myositis is not excluded. Given that the hematoma tracks along the superficial fascia margin, the possibility of a shearing injury in this vicinity is raised, correlate with recent trauma. 3. Asymmetric moderate-sized right knee joint effusion, very faint synovitis. 4.  Subcutaneous edema around the knees and upper calves, right greater than left. 5. Low-level edema and enhancement in the anterior compartment of the left lower leg, most notably involving the extensor digitorum longus muscle. This is likely indicative of inflammation but is otherwise nonspecific. Overall no effacement of anterior compartmental adipose tissue to suggest compartment syndrome on the left. 6. Degenerative findings in the right knee most notably in the patellofemoral joint. Electronically Signed: By: Van Clines M.D. On: 04/16/2015 06:45   US Aspiration  04/22/2015  INDICATION: Right leg pain with a right leg Baker's cyst and complex hematoma. Request for ultrasound-guided aspiration. EXAM: ULTRASOUND-GUIDED ASPIRATION OF RIGHT CALF HEMATOMA/ BAKER'S CYST MEDICATIONS: None ANESTHESIA/SEDATION: None COMPLICATIONS: None immediate. PROCEDURE: The procedure was explained to the patient. The risks and benefits of the procedure  were discussed and the patient's questions were addressed. Informed consent was obtained from the patient. Right leg was evaluated with ultrasound. The right calf was prepped with chlorhexidine and a sterile field was created. Skin was anesthetized with 1% lidocaine. A Yueh catheter was directed into the complex fluid collection. Only a small amount of bloody fluid could be obtained. Therefore, skin was anesthetized higher in the calf and a Yueh needle was directed into a different portion of the collection. Some additional bloody fluid was obtained. Total of 8 mL of bloody fluid was removed. FINDINGS: There is a large complex heterogeneous collection involving the posterior medial aspect of the right calf. The calf collection is contiguous with the Baker's cyst. The Baker's cyst and large calf collection have a similar heterogeneous appearance with multiple echogenic foci. These echogenic foci could represent gas. IMPRESSION: Ultrasound-guided aspiration of the right calf  complex fluid collection. Findings are most compatible with a hematoma. Cannot exclude gas within the hematoma based on ultrasound findings. Electronically Signed   By: Markus Daft M.D.   On: 04/22/2015 15:10   Ir Removal Tun Cv Cath W/o Fl  04/23/2015  INDICATION: History of septic arthritis post placement of a tunneled right internal jugular approach study venous catheter for long-term antibiotic administration. The patient has completed her course of antibiotics and presents today for central line removal. The catheter was placed by Dr. Earleen Newport on 04/01/2015 and has functioned well throughout duration of usage. No signs of catheter related infection. EXAM: REMOVAL OF TUNNELED HEMODIALYSIS CATHETER MEDICATIONS: None COMPLICATIONS: None immediate. PROCEDURE: Informed written consent was obtained from the patient following an explanation of the procedure, risks, benefits and alternatives to treatment. A time out was performed prior to the initiation of the procedure. Maximal barrier sterile technique was utilized including caps, mask, sterile gowns, sterile gloves, large sterile drape, hand hygiene, and Betadine. 1% lidocaine with epinephrine was injected under sterile conditions along the subcutaneous tunnel. Utilizing a combination of blunt dissection and gentle traction, the catheter was removed intact. Hemostasis was obtained with manual compression. A dressing was placed. The patient tolerated the procedure well without immediate post procedural complication. IMPRESSION: Successful removal of tunneled PICC line. Electronically Signed   By: Sandi Mariscal M.D.   On: 04/23/2015 15:23   Dg Chest Port 1 View  04/12/2015  CLINICAL DATA:  Shortness of breath EXAM: PORTABLE CHEST 1 VIEW COMPARISON:  04/08/2014 FINDINGS: Right-sided central line in stable position with tip at the SVC level. Normal heart size for technique.  Stable aortic and hilar contours. Layering pleural effusions causing hazy basilar opacity.  Diffuse interstitial coarsening with cephalized blood flow. IMPRESSION: Mild pulmonary edema with layering pleural effusions. Electronically Signed   By: Monte Fantasia M.D.   On: 04/12/2015 22:39    Disposition: 06-Home-Health Care Svc      Discharge Instructions    Weight bearing as tolerated    Complete by:  As directed   Laterality:  right  Extremity:  Lower           Follow-up Information    Follow up with MURPHY, TIMOTHY D, MD. Call in 1 week.   Specialty:  Orthopedic Surgery   Contact information:   [address] [place] [id number] [phone]       Follow up with Bobby Rumpf, MD. Call in 1 week.   Specialty:  Infectious Diseases   Contact information:   [address] [place] [address] [phone]  Follow up with [name], MD. Call in 1 week.   Specialty:  Internal Medicine   Why:  repeat labs to check CBC   Contact information:   [address] [address] [phone]        Signed: Gae Dry 05/07/2015, 3:11 PM Cell [phone]

## 2015-05-07 NOTE — Discharge Instructions (Signed)
Keep dressings clean and dry till follow up  Continue abx per ID team.  Please notify MD if you develop fevers/chills or erythema/warmth of the joint.    WBAT in the R leg.  Knee immobilizer for comfort only  Follow up in one week with PCP to check labs for anemia

## 2015-05-07 NOTE — Progress Notes (Signed)
Lissa Morales discharged home per MD order. Discharge instructions reviewed and discussed with patient. All questions and concerns answered. Copy of instructions and scripts given to patient. IV removed.  Patient escorted to car by staff in a wheelchair. No distress noted upon discharge.   Esaw Dace 05/07/2015 7:41 PM

## 2015-05-07 NOTE — Progress Notes (Signed)
Subjective: No new complaints   Antibiotics:  Anti-infectives    Start     Dose/Rate Route Frequency Ordered Stop   05/06/15 0600  ceFAZolin (ANCEF) IVPB 2 g/50 mL premix  Status:  Discontinued     2 g 100 mL/hr over 30 Minutes Intravenous On call to O.R. 05/05/15 1233 05/06/15 1517   05/05/15 1800  cefTRIAXone (ROCEPHIN) 2 g in dextrose 5 % 50 mL IVPB  Status:  Discontinued     2 g 100 mL/hr over 30 Minutes Intravenous Every 24 hours 05/05/15 1519 05/06/15 1517   05/05/15 1700  levofloxacin (LEVAQUIN) tablet 750 mg     750 mg Oral Every 24 hours 05/05/15 1607     05/05/15 1615  ceFAZolin (ANCEF) IVPB 1 g/50 mL premix  Status:  Discontinued     1 g 100 mL/hr over 30 Minutes Intravenous Every 6 hours 05/05/15 1607 05/05/15 1614   05/05/15 1233  ceFAZolin (ANCEF) 2-3 GM-% IVPB SOLR    Comments:  Scronce, Trina   : cabinet override      05/05/15 1233 05/05/15 1325      Medications: Scheduled Meds: . amLODipine  10 mg Oral Daily  . chlorhexidine  60 mL Topical Once  . docusate sodium  100 mg Oral BID  . enoxaparin (LOVENOX) injection  40 mg Subcutaneous Q24H  . feeding supplement (GLUCERNA SHAKE)  237 mL Oral BID BM  . ferrous sulfate  325 mg Oral BID WC  . insulin aspart  0-5 Units Subcutaneous QHS  . insulin aspart  0-9 Units Subcutaneous TID WC  . insulin glargine  5 Units Subcutaneous Daily  . labetalol  200 mg Oral TID  . levofloxacin  750 mg Oral Q24H  . lisinopril  20 mg Oral Daily  . multivitamin with minerals  1 tablet Oral Daily   Continuous Infusions: . lactated ringers 10 mL/hr at 05/05/15 1305   PRN Meds:.acetaminophen **OR** acetaminophen, bisacodyl, diphenhydrAMINE, HYDROmorphone (DILAUDID) injection, oxyCODONE, polyethylene glycol, promethazine, sodium phosphate, sorbitol    Objective: Weight change:   Intake/Output Summary (Last 24 hours) at 05/07/15 1354 Last data filed at 05/07/15 1248  Gross per 24 hour  Intake    575 ml  Output      25 ml  Net    550 ml   Blood pressure 151/70, pulse 98, temperature 99 F (37.2 C), temperature source Oral, resp. rate 16, weight 157 lb (71.215 kg), SpO2 100 %. Temp:  [98.4 F (36.9 C)-99 F (37.2 C)] 99 F (37.2 C) (02/24 1248) Pulse Rate:  [95-105] 98 (02/24 1248) Resp:  [16] 16 (02/24 1248) BP: (132-167)/(70-91) 151/70 mmHg (02/24 1248) SpO2:  [99 %-100 %] 100 % (02/24 1230)  Physical Exam: General: Alert and awake, oriented x3, not in any acute distress. HEENT: anicteric sclera, pupils reactive to light and accommodation, EOMI CVS regular rate, normal r,  no murmur rubs or gallops Chest: clear to auscultation bilaterally, no wheezing, rales or rhonchi  Extremities: Right arm still with some limited motion and right knee with bandage and drain  Skin: no rashes Neuro: nonfocal  CBC:  CBC Latest Ref Rng 05/07/2015 05/07/2015 05/05/2015  WBC 4.0 - 10.5 K/uL - 5.6 7.7  Hemoglobin 12.0 - 15.0 g/dL 7.5(L) 7.2(L) 7.9(L)  Hematocrit 36.0 - 46.0 % 22.8(L) 22.0(L) 24.5(L)  Platelets 150 - 400 K/uL - 321 414(H)       BMET  Recent Labs  05/05/15 1246 05/06/15 0520  NA 133*  137  K 4.2 4.1  CL 98* 101  CO2 21* 26  GLUCOSE 265* 190*  BUN 13 9  CREATININE 0.91 0.79  CALCIUM 9.4 8.9     Liver Panel   Recent Labs  05/06/15 0520  PROT 6.5  ALBUMIN 2.0*  AST 22  ALT 11*  ALKPHOS 54  BILITOT 0.1*       Sedimentation Rate  Recent Labs  05/07/15 0542  ESRSEDRATE 129*   C-Reactive Protein  Recent Labs  05/07/15 0542  CRP 5.0*    Micro Results: Recent Results (from the past 720 hour(s))  Culture, routine-abscess     Status: None   Collection Time: 04/09/15  4:01 AM  Result Value Ref Range Status   Specimen Description ABSCESS  Final   Special Requests RIGHT CALF  Final   Gram Stain   Final    ABUNDANT WBC PRESENT,BOTH PMN AND MONONUCLEAR NO SQUAMOUS EPITHELIAL CELLS SEEN NO ORGANISMS SEEN Performed at [facility]    Culture   Final      NO GROWTH 2 DAYS Performed at [facility]    Report Status 04/11/2015 FINAL  Final  Blood culture (routine x 2)     Status: None   Collection Time: 04/12/15 10:30 PM  Result Value Ref Range Status   Specimen Description BLOOD LEFT ARM  Final   Special Requests BOTTLES DRAWN AEROBIC AND ANAEROBIC 5CC  Final   Culture NO GROWTH 5 DAYS  Final   Report Status 04/17/2015 FINAL  Final  Blood culture (routine x 2)     Status: None   Collection Time: 04/12/15 10:39 PM  Result Value Ref Range Status   Specimen Description BLOOD LEFT HAND  Final   Special Requests BOTTLES DRAWN AEROBIC AND ANAEROBIC 5CC  Final   Culture NO GROWTH 5 DAYS  Final   Report Status 04/17/2015 FINAL  Final  Urine culture     Status: None   Collection Time: 04/12/15 11:28 PM  Result Value Ref Range Status   Specimen Description URINE, CLEAN CATCH  Final   Special Requests NONE  Final   Culture NO GROWTH 1 DAY  Final   Report Status 04/14/2015 FINAL  Final  MRSA PCR Screening     Status: None   Collection Time: 04/13/15  2:22 AM  Result Value Ref Range Status   MRSA by PCR NEGATIVE NEGATIVE Final    Comment:        The GeneXpert MRSA Assay (FDA approved for NASAL specimens only), is one component of a comprehensive MRSA colonization surveillance program. It is not intended to diagnose MRSA infection nor to guide or monitor treatment for MRSA infections.   Body fluid culture     Status: None   Collection Time: 04/22/15  3:00 PM  Result Value Ref Range Status   Specimen Description FLUID RIGHT LEG  Final   Special Requests BAKER'S CYST  Final   Gram Stain   Final    ABUNDANT WBC PRESENT,BOTH PMN AND MONONUCLEAR NO ORGANISMS SEEN    Culture NO GROWTH 3 DAYS  Final   Report Status 04/26/2015 FINAL  Final  Anaerobic culture     Status: None (Preliminary result)   Collection Time: 05/05/15  1:42 PM  Result Value Ref Range Status   Specimen Description FLUID SYNOVIAL RIGHT KNEE  Final    Special Requests NONE  Final   Gram Stain   Final    MODERATE WBC PRESENT,BOTH PMN AND MONONUCLEAR NO ORGANISMS  SEEN    Culture   Final    NO ANAEROBES ISOLATED; CULTURE IN PROGRESS FOR 5 DAYS   Report Status PENDING  Incomplete  Body fluid culture     Status: None (Preliminary result)   Collection Time: 05/05/15  1:42 PM  Result Value Ref Range Status   Specimen Description FLUID SYNOVIAL RIGHT KNEE  Final   Special Requests NONE  Final   Gram Stain   Final    MODERATE WBC PRESENT,BOTH PMN AND MONONUCLEAR NO ORGANISMS SEEN    Culture NO GROWTH 2 DAYS  Final   Report Status PENDING  Incomplete    Studies/Results: No results found.    Assessment/Plan:  INTERVAL HISTORY: cultures no growth FINAL from 05/04/15 anaorbic   Active Problems:   Diabetes mellitus ([place])   Essential hypertension   Acute on chronic diastolic heart failure (HCC)   Septic joint of right knee joint (HCC)   Diabetes mellitus with complication (HCC)   Chronic diastolic congestive heart failure (HCC)   Sepsis due to group A Streptococcus (HCC)   Streptococcal arthritis of right shoulder (HCC)    [name] is a 50 y.o. female with  history of group A streptococcal bacteremia and septic shoulder status post I&D of right shoulder in January status post IV penicillin and then levofloxacin orally then developed right knee and leg swelling and edema and pain found to have a Baker's cyst and then a right knee effusion that was tapped and found to be consistent with infection status post arthroscopic debridement and lavage.  #1 Septic knee: Likely seeded by her original group A strep bacteremia:  We'll continue her on oral levofloxacin   Cultures taken from Dr. Debroah Loop office on the 21st were negative on aerobic growth final anaerobic growth has 1 more day to go.  Cultures taken here in the operating room and also not growing any organisms  I would treat her for 6 weeks postoperatively with close  follow-up with [place] in infectious disease as well as with orthopedic surgery.  #2 group A strep bacteremia and right septic shoulder: She seems over spine are well to therapy and no evidence of recurrence at her shoulder  I will arrange for hospital follow-up for the patient and her neck. Weeks and infectious disease she believed that she had a follow-up on the ready in the next 2 weeks but I don't see him schedule I will ask for her to be seen within the next 3 weeks.  Dr. Megan Salon be available over the weekend should any questions arise.   LOS: 2 days   Alcide Evener 05/07/2015, 1:54 PM

## 2015-05-08 LAB — HEPATITIS C ANTIBODY (REFLEX): HCV Ab: 0.2 s/co ratio (ref 0.0–0.9)

## 2015-05-08 LAB — HEPATITIS B SURFACE ANTIGEN: HEP B S AG: NEGATIVE

## 2015-05-08 LAB — BODY FLUID CULTURE: Culture: NO GROWTH

## 2015-05-08 LAB — HEPATITIS B SURFACE ANTIBODY, QUANTITATIVE: HEPATITIS B-POST: 8.4 m[IU]/mL — AB

## 2015-05-08 LAB — TYPE AND SCREEN
ABO/RH(D): O POS
ANTIBODY SCREEN: NEGATIVE
UNIT DIVISION: 0

## 2015-05-08 LAB — HCV COMMENT:

## 2015-05-10 LAB — ANAEROBIC CULTURE

## 2015-06-07 ENCOUNTER — Ambulatory Visit (INDEPENDENT_AMBULATORY_CARE_PROVIDER_SITE_OTHER): Payer: Managed Care, Other (non HMO) | Admitting: Infectious Diseases

## 2015-06-07 ENCOUNTER — Encounter: Payer: Self-pay | Admitting: Infectious Diseases

## 2015-06-07 VITALS — BP 142/89 | HR 92 | Temp 97.6°F | Ht 66.0 in | Wt 147.0 lb

## 2015-06-07 DIAGNOSIS — R7881 Bacteremia: Secondary | ICD-10-CM

## 2015-06-07 DIAGNOSIS — M00261 Other streptococcal arthritis, right knee: Secondary | ICD-10-CM

## 2015-06-07 DIAGNOSIS — B955 Unspecified streptococcus as the cause of diseases classified elsewhere: Secondary | ICD-10-CM

## 2015-06-07 DIAGNOSIS — B954 Other streptococcus as the cause of diseases classified elsewhere: Secondary | ICD-10-CM

## 2015-06-07 LAB — C-REACTIVE PROTEIN: CRP: 3.5 mg/dL — AB (ref <0.60)

## 2015-06-07 MED ORDER — LEVOFLOXACIN 750 MG PO TABS: 750.0000 mg | ORAL_TABLET | Freq: Every day | ORAL | Status: AC

## 2015-06-07 NOTE — Progress Notes (Signed)
Subjective:    Patient ID: [name], female    DOB: 01/18/1966, 50 y.o.   MRN: [id number]  HPI 50 y.o. female with hx of diet controlled DM, who came to the hospital March 27, 2015. She had low-grade fever of 100.5 and acute renal insufficiency. She was started on empiric daptomycin and piperacillin tazobactam (vancomycin was avoided because of concerns about potential nephrotoxicity). Her blood cultures and UCx grew Group A Strep (R- clinda, erythro).  She was found on MRI of R humerus (1-16) to have 1. Large shoulder joint effusion with abnormal edema tracking within and along the margins of the regional musculature. In the setting of sepsis and new onset shoulder pain, there is a high degree of concern for septic glenohumeral joint, and arthrocentesis is likely indicated. 2. Partial thickness articular surface tearing of the supraspinatus and infraspinatus with tendon delamination. There is potentially mild partial thickness articular surface tearing of the subscapularis. Considerable tendinopathy of these tendons as well. 3. Advanced tendinopathy or partial tearing of the long head of the biceps. 4. Increased signal in the superior labrum compatible with SLAP tear extending into the biceps anchor. 5. Subacromial subdeltoid bursitis. 6. Somewhat unusual flaring of the proximal humeral metadiaphysis, query prior fracture. 7. Moderate degenerative glenohumeral arthropathy. She was taken to OR on 1-17 and underwent I & D of R shoulder.  She was changed to IV Pen G and was d/c home on 1-25.  She was seen in ID on 2-7 and her IV Pen G was stopped as she felt like she was having a reaction.  She was then changed to levaquin.  She had also been seen in ED prior to that for RLE swelling and was noted to have hematoma/Baker's cyst. She had ortho f/u on 2-8 and she had aspirate. Since then she has had bloody oozing.  Has had f/c, temp up to 99.8.  She was seen in ID for f/u  on2-14. She was continued on her PO atbx and was sent for u/s of her RLE (no DVT but she was noted to have a large baker's cyst).  She then returned to hospital on 2-22 to 2-24 and underwent I & D of her R knee for septic arthritis (62k WBC).  Her Cx were (-), she was d/c on levaquin.  Her CRP was 5 and her ESR 129.   She states she is doing well.  No f/c. She is using her knee without difficulty although some limp.  Swelling is improving in her knee, she occasionally feels like it is unstable.  Wounds are well healed.   Review of Systems See HPI    Objective:   Physical Exam  Constitutional: She appears well-developed and well-nourished.  Musculoskeletal:       Legs:         Assessment & Plan:   

## 2015-06-07 NOTE — Assessment & Plan Note (Signed)
She appears to be doing well. Will repeat her ESR and CRP Will repeat her BCx (at her request) Will continue her levaqun for 2 more weeks.  Will call her with results Will see her back prn.

## 2015-06-08 ENCOUNTER — Telehealth: Payer: Self-pay | Admitting: Infectious Diseases

## 2015-06-08 LAB — SEDIMENTATION RATE: Sed Rate: 74 mm/hr — ABNORMAL HIGH (ref 0–30)

## 2015-06-08 NOTE — Telephone Encounter (Signed)
Called pt and let her know her lab results.  She is doing well. Will complete her atbx as written, f/u with pcp.

## 2015-06-13 LAB — CULTURE, BLOOD (SINGLE)
Organism ID, Bacteria: NO GROWTH
Organism ID, Bacteria: NO GROWTH

## 2016-06-27 ENCOUNTER — Other Ambulatory Visit: Payer: Self-pay | Admitting: Internal Medicine

## 2016-06-27 DIAGNOSIS — Z1231 Encounter for screening mammogram for malignant neoplasm of breast: Secondary | ICD-10-CM

## 2016-07-12 ENCOUNTER — Ambulatory Visit
Admission: RE | Admit: 2016-07-12 | Discharge: 2016-07-12 | Disposition: A | Discharge status: Home / Self Care | Payer: 59 | Source: Ambulatory Visit | Attending: Internal Medicine | Admitting: Internal Medicine

## 2016-07-12 DIAGNOSIS — Z1231 Encounter for screening mammogram for malignant neoplasm of breast: Secondary | ICD-10-CM

## 2016-08-22 ENCOUNTER — Other Ambulatory Visit (HOSPITAL_COMMUNITY)
Admission: RE | Admit: 2016-08-22 | Discharge: 2016-08-22 | Disposition: A | Discharge status: Home / Self Care | Payer: Managed Care, Other (non HMO) | Source: Ambulatory Visit | Attending: Obstetrics and Gynecology | Admitting: Obstetrics and Gynecology

## 2016-08-22 ENCOUNTER — Other Ambulatory Visit: Payer: Self-pay | Admitting: Obstetrics and Gynecology

## 2016-08-22 DIAGNOSIS — Z1151 Encounter for screening for human papillomavirus (HPV): Secondary | ICD-10-CM | POA: Insufficient documentation

## 2016-08-22 DIAGNOSIS — Z01419 Encounter for gynecological examination (general) (routine) without abnormal findings: Secondary | ICD-10-CM | POA: Insufficient documentation

## 2016-08-24 LAB — CYTOLOGY - PAP
DIAGNOSIS: NEGATIVE
HPV: NOT DETECTED

## 2017-11-22 ENCOUNTER — Other Ambulatory Visit: Payer: Self-pay | Admitting: Internal Medicine

## 2017-11-22 DIAGNOSIS — Z1231 Encounter for screening mammogram for malignant neoplasm of breast: Secondary | ICD-10-CM

## 2017-12-24 ENCOUNTER — Ambulatory Visit
Admission: RE | Admit: 2017-12-24 | Discharge: 2017-12-24 | Disposition: A | Discharge status: Home / Self Care | Payer: Managed Care, Other (non HMO) | Source: Ambulatory Visit | Attending: Internal Medicine | Admitting: Internal Medicine

## 2017-12-24 DIAGNOSIS — Z1231 Encounter for screening mammogram for malignant neoplasm of breast: Secondary | ICD-10-CM

## 2019-09-08 ENCOUNTER — Other Ambulatory Visit: Payer: Self-pay | Admitting: Internal Medicine

## 2019-09-08 DIAGNOSIS — Z1231 Encounter for screening mammogram for malignant neoplasm of breast: Secondary | ICD-10-CM

## 2019-09-12 ENCOUNTER — Ambulatory Visit
Admission: RE | Admit: 2019-09-12 | Discharge: 2019-09-12 | Disposition: A | Discharge status: Home / Self Care | Payer: 59 | Source: Ambulatory Visit | Attending: Internal Medicine | Admitting: Internal Medicine

## 2019-09-12 ENCOUNTER — Other Ambulatory Visit: Payer: Self-pay

## 2019-09-12 DIAGNOSIS — Z1231 Encounter for screening mammogram for malignant neoplasm of breast: Secondary | ICD-10-CM

## 2019-09-17 ENCOUNTER — Other Ambulatory Visit: Payer: Self-pay | Admitting: Internal Medicine

## 2019-09-17 DIAGNOSIS — R928 Other abnormal and inconclusive findings on diagnostic imaging of breast: Secondary | ICD-10-CM

## 2019-09-23 ENCOUNTER — Other Ambulatory Visit: Payer: Self-pay

## 2019-09-23 ENCOUNTER — Other Ambulatory Visit: Payer: Self-pay | Admitting: Internal Medicine

## 2019-09-23 ENCOUNTER — Ambulatory Visit
Admission: RE | Admit: 2019-09-23 | Discharge: 2019-09-23 | Disposition: A | Discharge status: Home / Self Care | Payer: 59 | Source: Ambulatory Visit | Attending: Internal Medicine | Admitting: Internal Medicine

## 2019-09-23 DIAGNOSIS — R928 Other abnormal and inconclusive findings on diagnostic imaging of breast: Secondary | ICD-10-CM

## 2019-09-23 DIAGNOSIS — R921 Mammographic calcification found on diagnostic imaging of breast: Secondary | ICD-10-CM

## 2020-03-26 ENCOUNTER — Ambulatory Visit
Admission: RE | Admit: 2020-03-26 | Discharge: 2020-03-26 | Disposition: A | Discharge status: Home / Self Care | Payer: 59 | Source: Ambulatory Visit | Attending: Internal Medicine | Admitting: Internal Medicine

## 2020-03-26 ENCOUNTER — Other Ambulatory Visit: Payer: Self-pay | Admitting: Internal Medicine

## 2020-03-26 ENCOUNTER — Other Ambulatory Visit: Payer: Self-pay

## 2020-03-26 DIAGNOSIS — R921 Mammographic calcification found on diagnostic imaging of breast: Secondary | ICD-10-CM

## 2020-09-14 ENCOUNTER — Other Ambulatory Visit: Payer: Self-pay

## 2020-09-14 ENCOUNTER — Ambulatory Visit
Admission: RE | Admit: 2020-09-14 | Discharge: 2020-09-14 | Disposition: A | Discharge status: Home / Self Care | Payer: 59 | Source: Ambulatory Visit | Attending: Internal Medicine | Admitting: Internal Medicine

## 2020-09-14 DIAGNOSIS — R921 Mammographic calcification found on diagnostic imaging of breast: Secondary | ICD-10-CM

## 2021-04-27 ENCOUNTER — Other Ambulatory Visit: Payer: Self-pay | Admitting: Obstetrics and Gynecology

## 2021-04-27 DIAGNOSIS — M8580 Other specified disorders of bone density and structure, unspecified site: Secondary | ICD-10-CM

## 2021-09-22 ENCOUNTER — Other Ambulatory Visit: Payer: Self-pay | Admitting: Internal Medicine

## 2021-09-22 ENCOUNTER — Ambulatory Visit
Admission: RE | Admit: 2021-09-22 | Discharge: 2021-09-22 | Disposition: A | Discharge status: Home / Self Care | Payer: Managed Care, Other (non HMO) | Source: Ambulatory Visit | Attending: Obstetrics and Gynecology | Admitting: Obstetrics and Gynecology

## 2021-09-22 DIAGNOSIS — R921 Mammographic calcification found on diagnostic imaging of breast: Secondary | ICD-10-CM

## 2021-09-22 DIAGNOSIS — M8580 Other specified disorders of bone density and structure, unspecified site: Secondary | ICD-10-CM

## 2021-10-03 ENCOUNTER — Ambulatory Visit
Admission: RE | Admit: 2021-10-03 | Discharge: 2021-10-03 | Disposition: A | Discharge status: Home / Self Care | Payer: Managed Care, Other (non HMO) | Source: Ambulatory Visit | Attending: Internal Medicine | Admitting: Internal Medicine

## 2021-10-03 DIAGNOSIS — R921 Mammographic calcification found on diagnostic imaging of breast: Secondary | ICD-10-CM

## 2021-11-29 IMAGING — MG DIGITAL DIAGNOSTIC BILAT W/ TOMO W/ CAD
6 of 10 series · 6 of 26 positions shown · non-contrast
Comparison: Previous exam(s).

CLINICAL DATA: 55-year-old female presenting for annual bilateral
mammogram and 1 year follow-up of probably benign left breast
calcifications.

EXAM:
DIGITAL DIAGNOSTIC BILATERAL MAMMOGRAM WITH TOMOSYNTHESIS AND CAD
TECHNIQUE: Bilateral digital diagnostic mammography and breast tomosynthesis
was performed. The images were evaluated with computer-aided
detection.

[L CC]
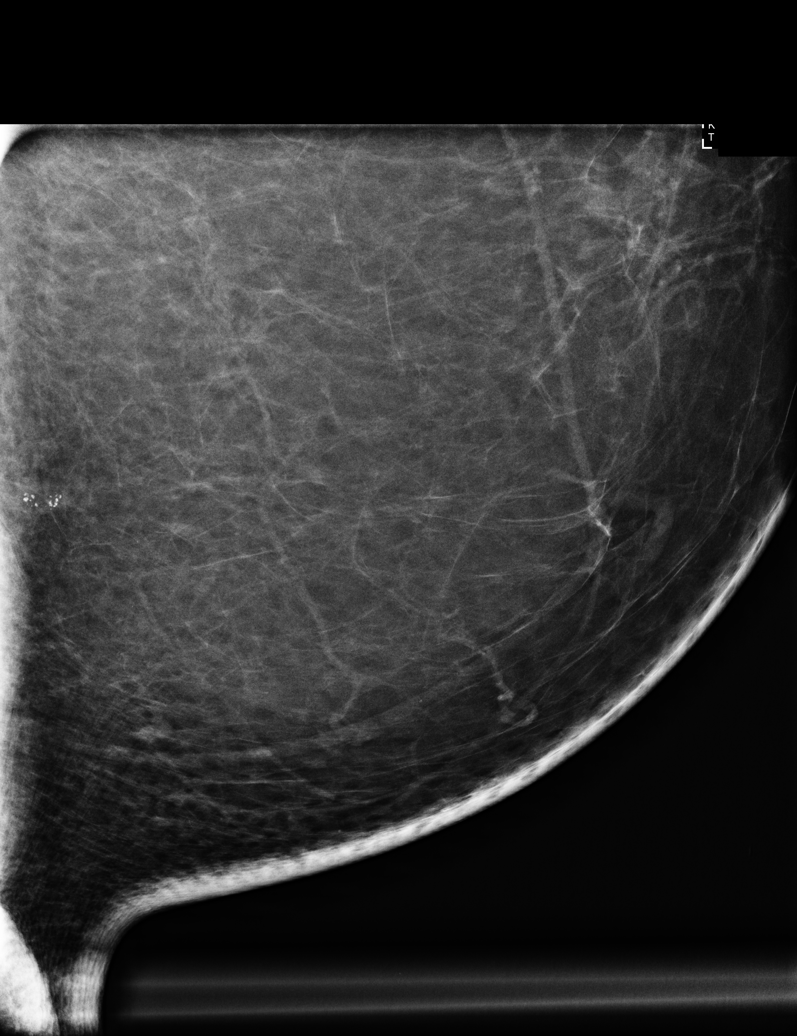

[L ML]
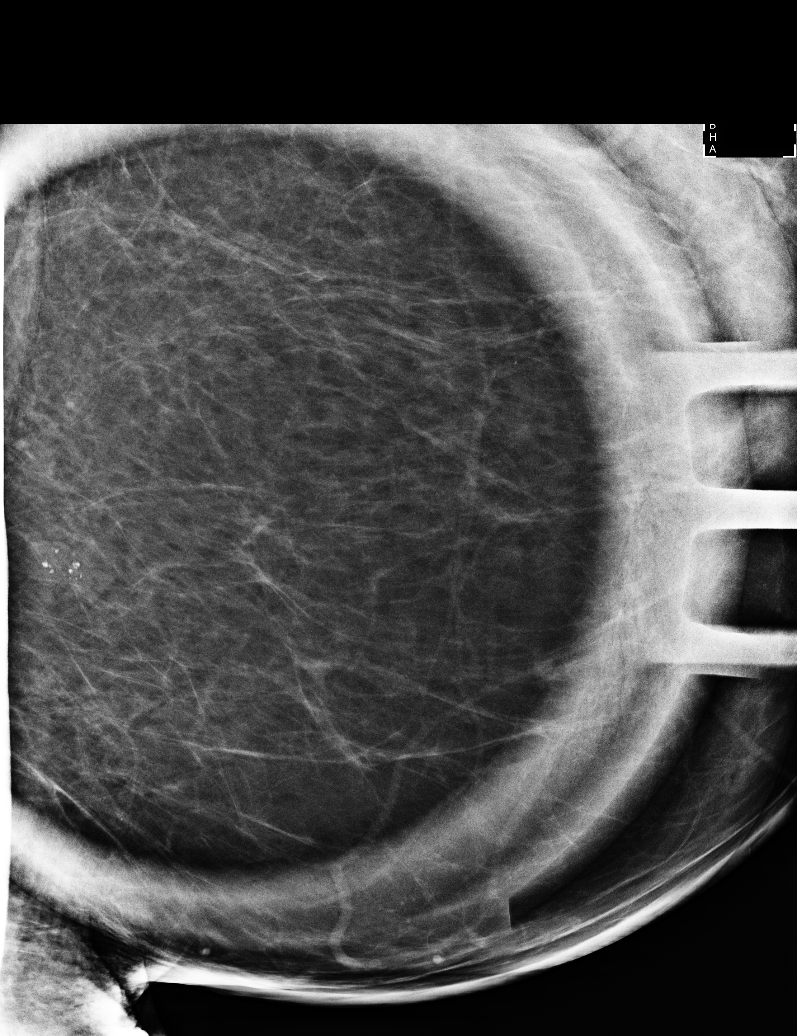

[R MLO synth-2D]
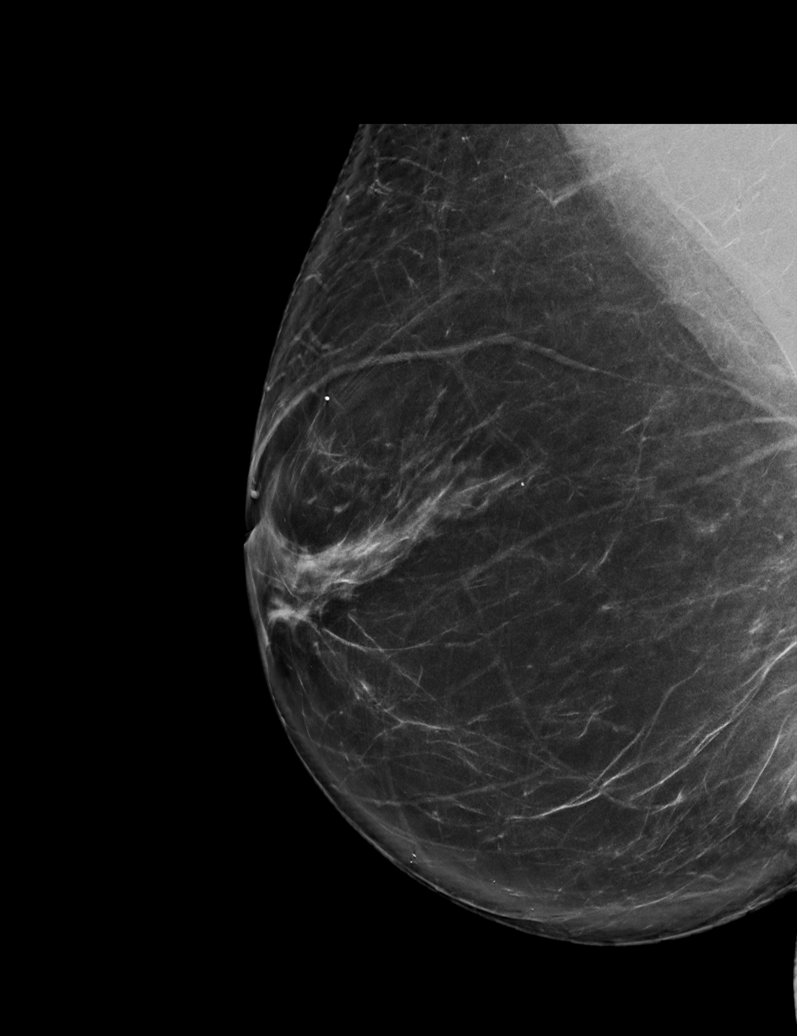

[R CC synth-2D]
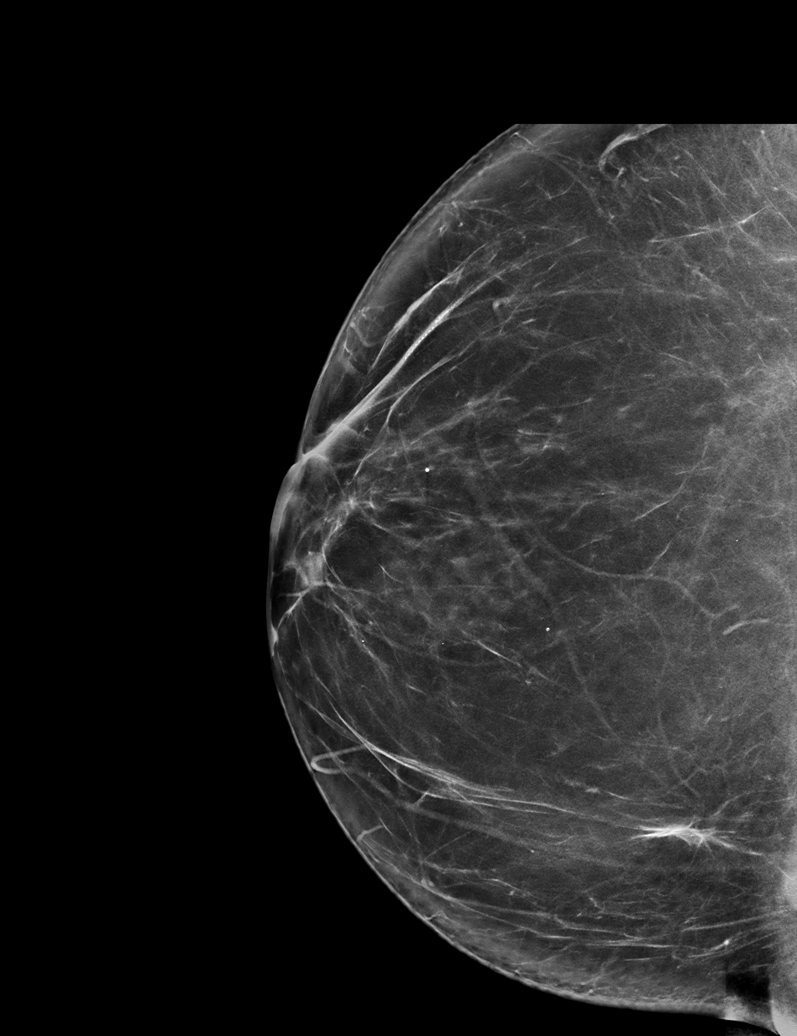

[L CC synth-2D]
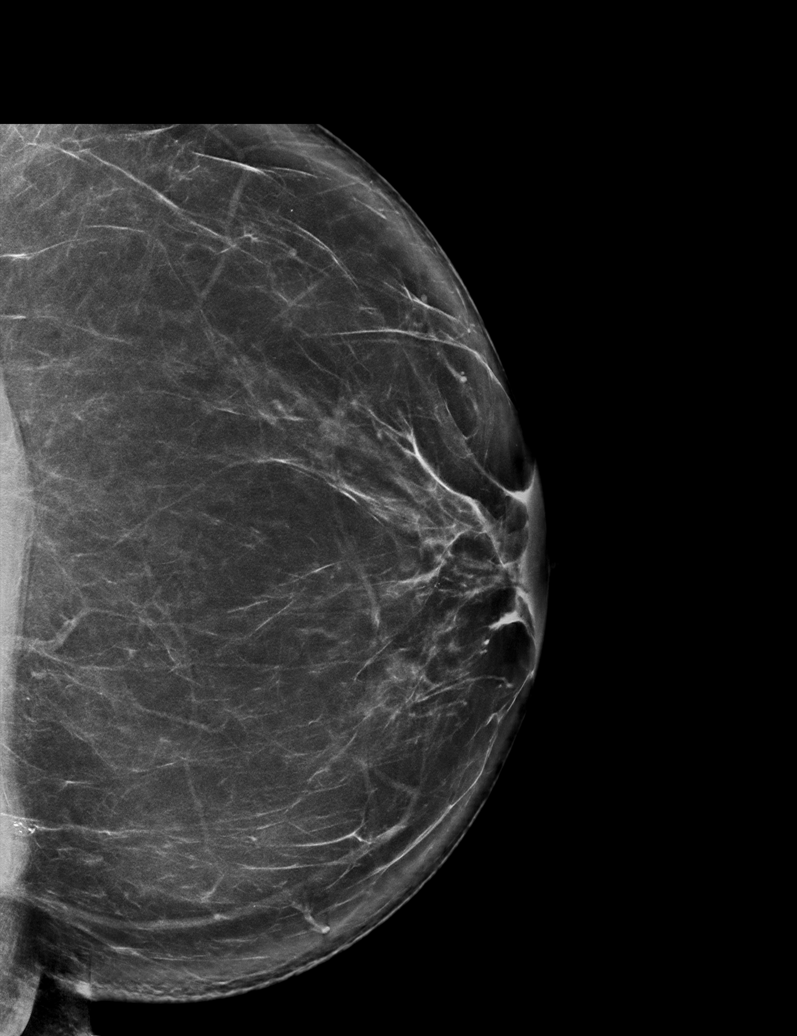

[L MLO synth-2D]
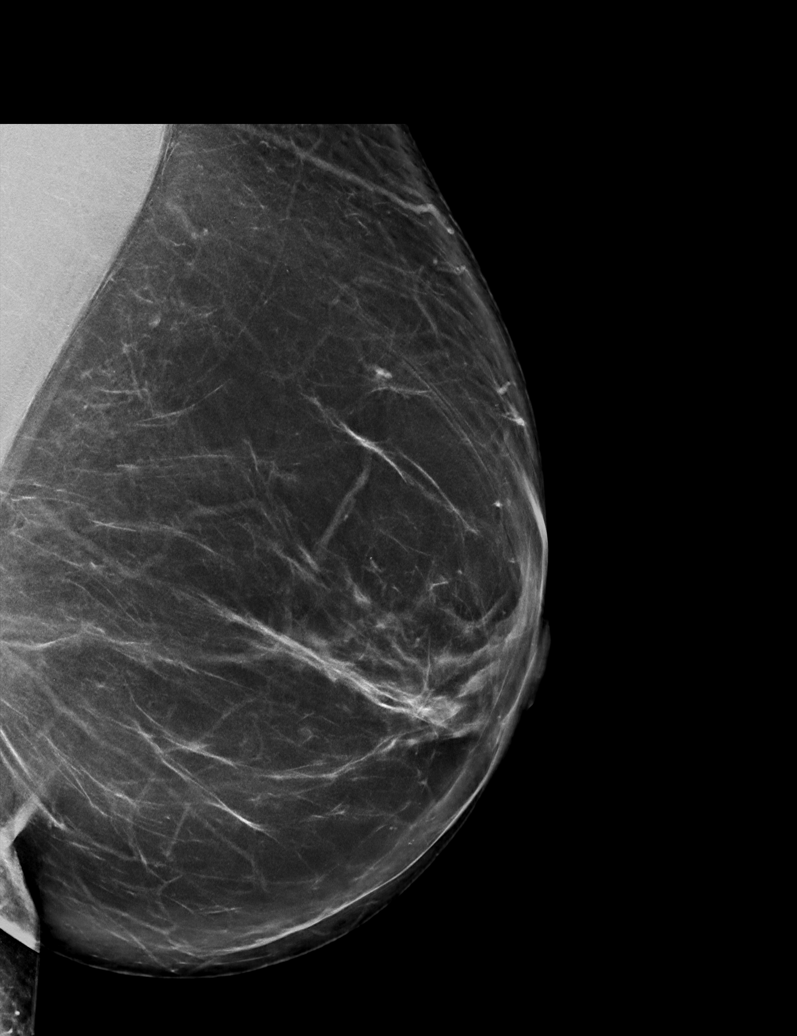

[6 of 26 positions shown; findings below may reference images not displayed]

ACR Breast Density Category b: There are scattered areas of
fibroglandular density.
FINDINGS: Grouped punctate and slightly coarse calcifications in the far
posterior lower inner left breast are mammographically stable. No
new or suspicious findings are identified in the remainder of either
breast. The parenchymal pattern is stable.
IMPRESSION: 1. Stable, probably benign left breast calcifications. Recommend a
final mammographic follow-up in 1 year.
2. No mammographic evidence of malignancy on the right.

RECOMMENDATION:
Bilateral diagnostic mammogram in 1 year.

I have discussed the findings and recommendations with the patient.
If applicable, a reminder letter will be sent to the patient
regarding the next appointment.

BI-RADS CATEGORY  3: Probably benign.

## 2022-08-23 ENCOUNTER — Other Ambulatory Visit: Payer: Self-pay | Admitting: Internal Medicine

## 2022-08-23 DIAGNOSIS — Z0000 Encounter for general adult medical examination without abnormal findings: Secondary | ICD-10-CM

## 2022-10-05 ENCOUNTER — Ambulatory Visit
Admission: RE | Admit: 2022-10-05 | Discharge: 2022-10-05 | Disposition: A | Discharge status: Home / Self Care | Payer: 59 | Source: Ambulatory Visit | Attending: Internal Medicine | Admitting: Internal Medicine

## 2022-10-05 DIAGNOSIS — Z0000 Encounter for general adult medical examination without abnormal findings: Secondary | ICD-10-CM

## 2023-08-22 ENCOUNTER — Other Ambulatory Visit: Payer: Self-pay | Admitting: Internal Medicine

## 2023-08-22 DIAGNOSIS — Z1231 Encounter for screening mammogram for malignant neoplasm of breast: Secondary | ICD-10-CM

## 2023-11-22 ENCOUNTER — Ambulatory Visit
Admission: RE | Admit: 2023-11-22 | Discharge: 2023-11-22 | Disposition: A | Discharge status: Home / Self Care | Source: Ambulatory Visit | Attending: Internal Medicine | Admitting: Internal Medicine

## 2023-11-22 DIAGNOSIS — Z1231 Encounter for screening mammogram for malignant neoplasm of breast: Secondary | ICD-10-CM
